# Patient Record
Sex: Female | Born: 1985 | Race: Black or African American | Hispanic: No | Marital: Married | Smoking: Current some day smoker
Health system: Southern US, Community
[De-identification: ages and names within clinical notes are randomized; demographics above are authoritative.]

## PROBLEM LIST (undated history)

## (undated) ENCOUNTER — Inpatient Hospital Stay (HOSPITAL_COMMUNITY): Payer: Self-pay

## (undated) DIAGNOSIS — R569 Unspecified convulsions: Secondary | ICD-10-CM

## (undated) DIAGNOSIS — Z87898 Personal history of other specified conditions: Secondary | ICD-10-CM

## (undated) DIAGNOSIS — C519 Malignant neoplasm of vulva, unspecified: Secondary | ICD-10-CM

## (undated) DIAGNOSIS — C801 Malignant (primary) neoplasm, unspecified: Secondary | ICD-10-CM

## (undated) DIAGNOSIS — Z803 Family history of malignant neoplasm of breast: Secondary | ICD-10-CM

## (undated) DIAGNOSIS — S0232XA Fracture of orbital floor, left side, initial encounter for closed fracture: Secondary | ICD-10-CM

## (undated) HISTORY — PX: WISDOM TOOTH EXTRACTION: SHX21

## (undated) HISTORY — PX: ABDOMINAL HYSTERECTOMY: SHX81

## (undated) HISTORY — DX: Malignant (primary) neoplasm, unspecified: C80.1

## (undated) HISTORY — DX: Family history of malignant neoplasm of breast: Z80.3

---

## 2008-09-27 ENCOUNTER — Emergency Department (HOSPITAL_COMMUNITY): Admission: EM | Admit: 2008-09-27 | Discharge: 2008-09-27 | Payer: Self-pay | Admitting: Emergency Medicine

## 2008-12-03 ENCOUNTER — Emergency Department (HOSPITAL_COMMUNITY): Admission: EM | Admit: 2008-12-03 | Discharge: 2008-12-03 | Payer: Self-pay | Admitting: Emergency Medicine

## 2010-05-23 LAB — URINALYSIS, ROUTINE W REFLEX MICROSCOPIC
Bilirubin Urine: NEGATIVE
Glucose, UA: NEGATIVE mg/dL
Specific Gravity, Urine: 1.01 (ref 1.005–1.030)

## 2010-05-23 LAB — WET PREP, GENITAL
Trich, Wet Prep: NONE SEEN
Yeast Wet Prep HPF POC: NONE SEEN

## 2010-05-23 LAB — URINE MICROSCOPIC-ADD ON

## 2010-05-26 LAB — BASIC METABOLIC PANEL
BUN: 19 mg/dL (ref 6–23)
CO2: 28 mEq/L (ref 19–32)
Chloride: 99 mEq/L (ref 96–112)
GFR calc non Af Amer: >60 mL/min (ref >60)
Glucose, Bld: 95 mg/dL (ref 70–99)
Potassium: 3.7 mEq/L (ref 3.5–5.1)
Sodium: 139 mEq/L (ref 135–145)

## 2010-07-25 ENCOUNTER — Other Ambulatory Visit: Payer: Self-pay | Admitting: Obstetrics

## 2010-07-25 DIAGNOSIS — Z975 Presence of (intrauterine) contraceptive device: Secondary | ICD-10-CM

## 2010-07-25 DIAGNOSIS — R102 Pelvic and perineal pain: Secondary | ICD-10-CM

## 2010-07-30 ENCOUNTER — Other Ambulatory Visit (HOSPITAL_COMMUNITY): Payer: Self-pay

## 2010-07-30 ENCOUNTER — Ambulatory Visit (HOSPITAL_COMMUNITY)
Admission: RE | Admit: 2010-07-30 | Discharge: 2010-07-30 | Disposition: A | Discharge status: Home / Self Care | Payer: Medicaid Other | Source: Ambulatory Visit | Attending: Obstetrics | Admitting: Obstetrics

## 2010-07-30 DIAGNOSIS — N949 Unspecified condition associated with female genital organs and menstrual cycle: Secondary | ICD-10-CM | POA: Insufficient documentation

## 2010-07-30 DIAGNOSIS — R102 Pelvic and perineal pain: Secondary | ICD-10-CM

## 2010-07-30 DIAGNOSIS — N854 Malposition of uterus: Secondary | ICD-10-CM | POA: Insufficient documentation

## 2010-07-30 DIAGNOSIS — Z975 Presence of (intrauterine) contraceptive device: Secondary | ICD-10-CM

## 2010-07-30 DIAGNOSIS — Z30431 Encounter for routine checking of intrauterine contraceptive device: Secondary | ICD-10-CM | POA: Insufficient documentation

## 2010-07-30 IMAGING — US US TRANSVAGINAL NON-OB
1 series · 13 of 25 positions shown · non-contrast
Comparison: None.

CLINICAL DATA: Pelvic pain.  Lost IUD string.

TRANSABDOMINAL AND TRANSVAGINAL ULTRASOUND OF PELVIS
TECHNIQUE: Both transabdominal and transvaginal ultrasound
examinations of the pelvis were performed.  Transabdominal
technique was performed for global imaging of the pelvis including
uterus, ovaries, adnexal regions, and pelvic cul-de-sac.
It was necessary to proceed with endovaginal exam following the
transabdominal exam to visualize the endometrium, IUD, and ovaries.
3-D volume imaging of the uterus was also performed to best
demonstrate IUD location.

[Series 1: us pelvis complete · 13 of 46 slices shown]
[im 1/46]
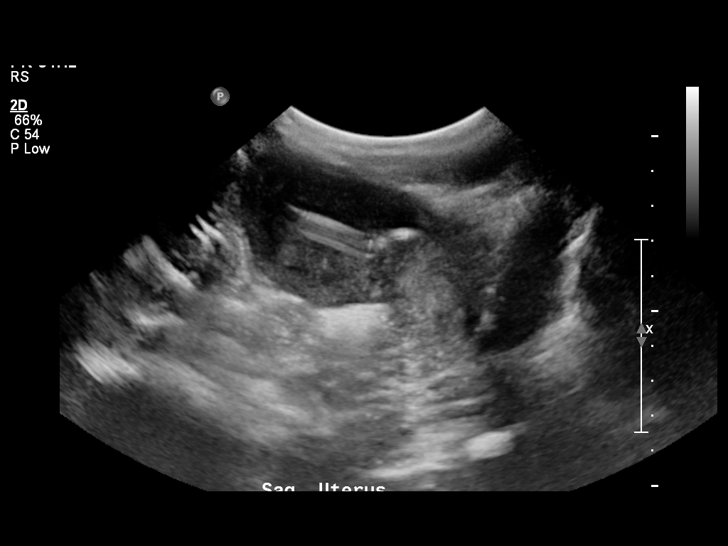
[im 4/46]
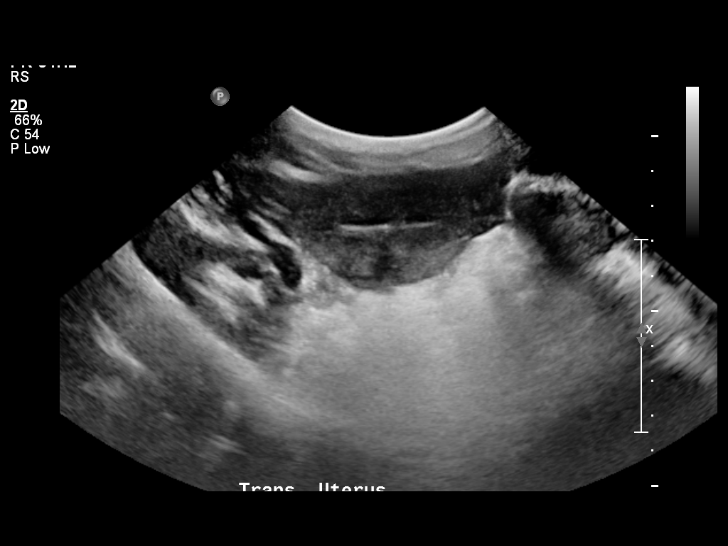
[im 8/46]
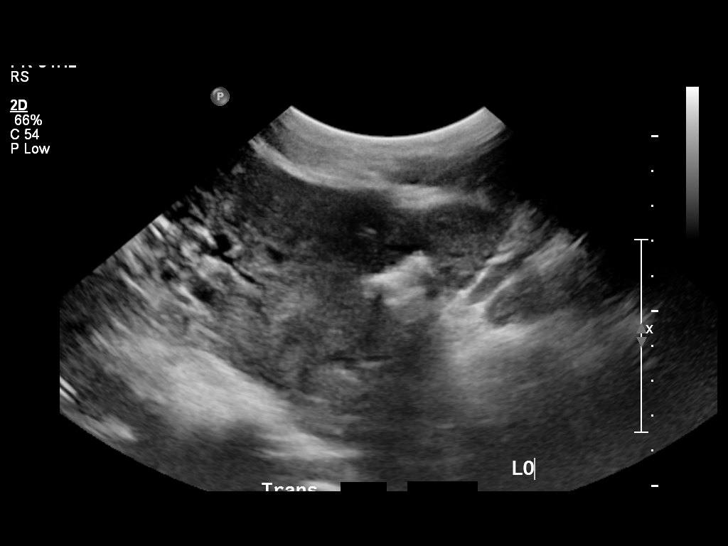
[im 12/46]
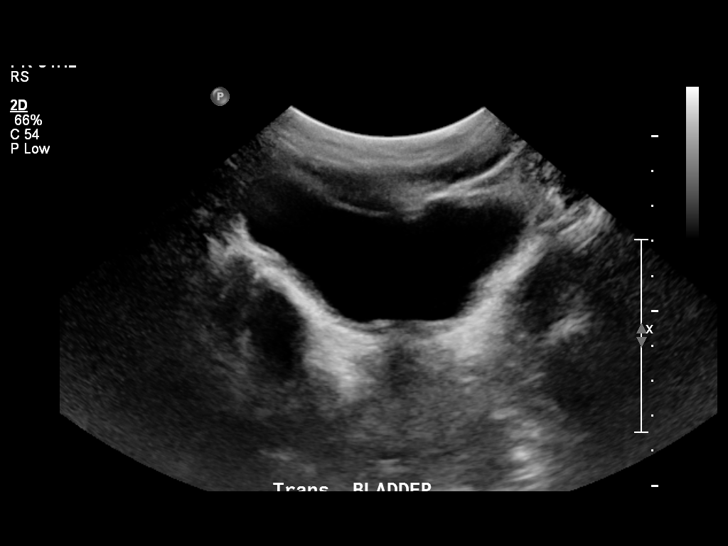
[im 16/46]
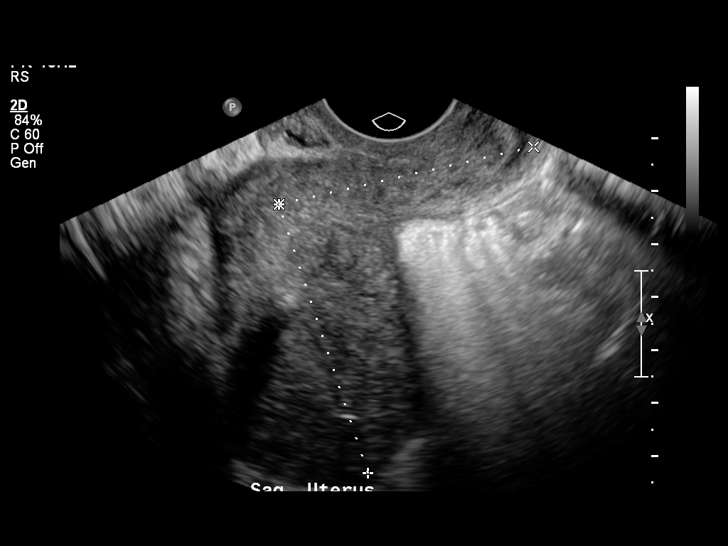
[im 19/46]
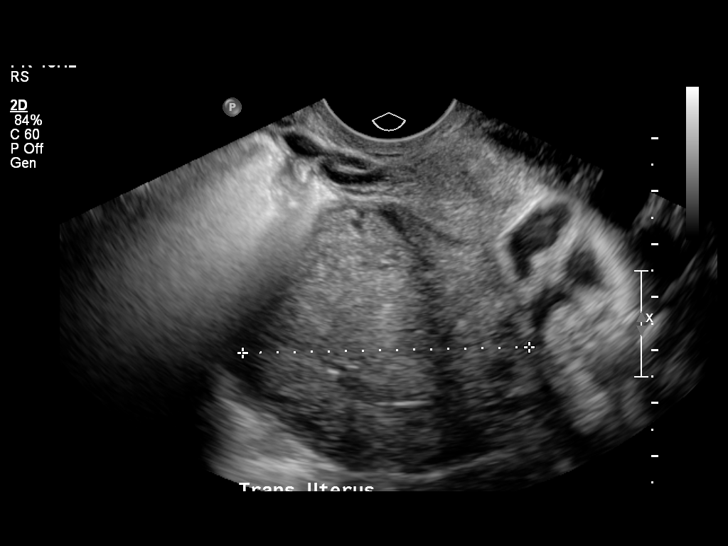
[im 23/46]
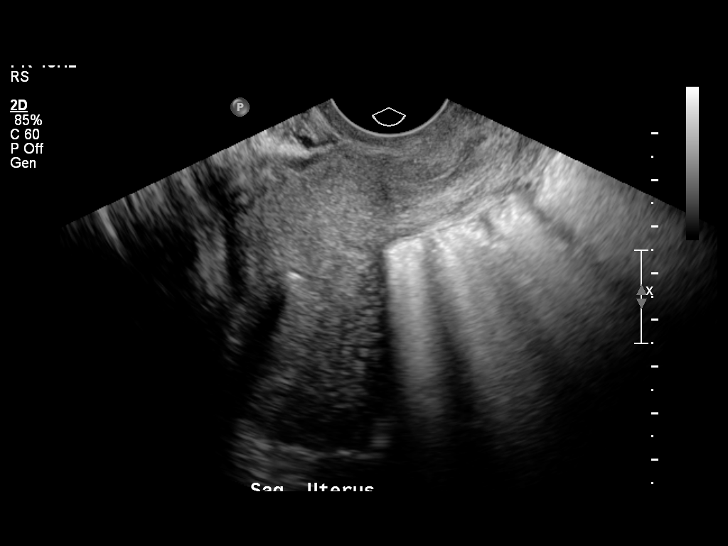
[im 27/46]
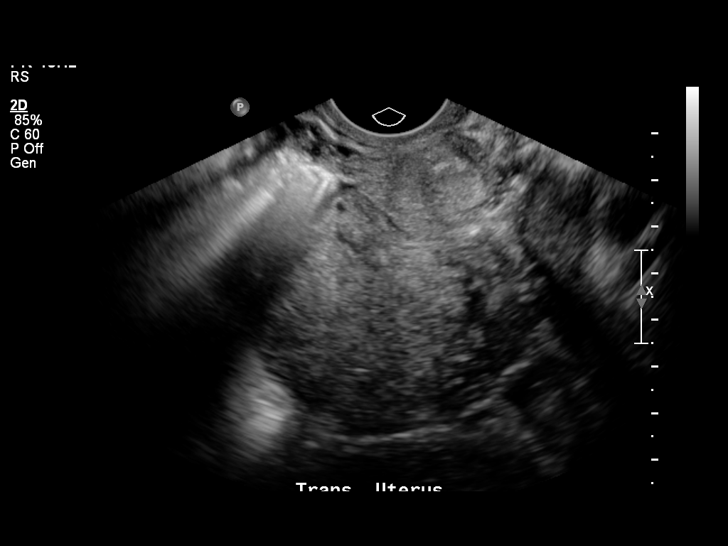
[im 31/46]
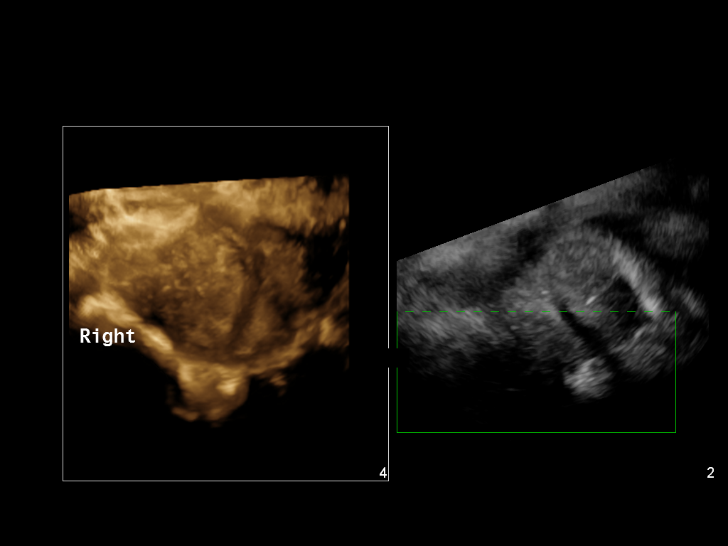
[im 34/46]
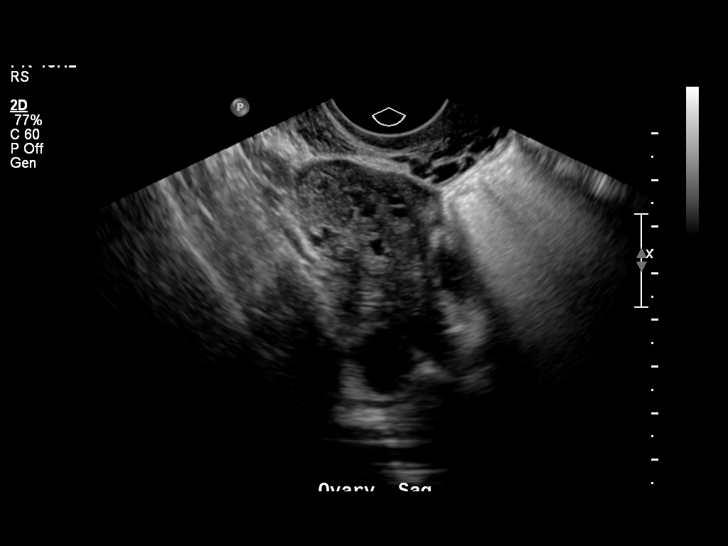
[im 38/46]
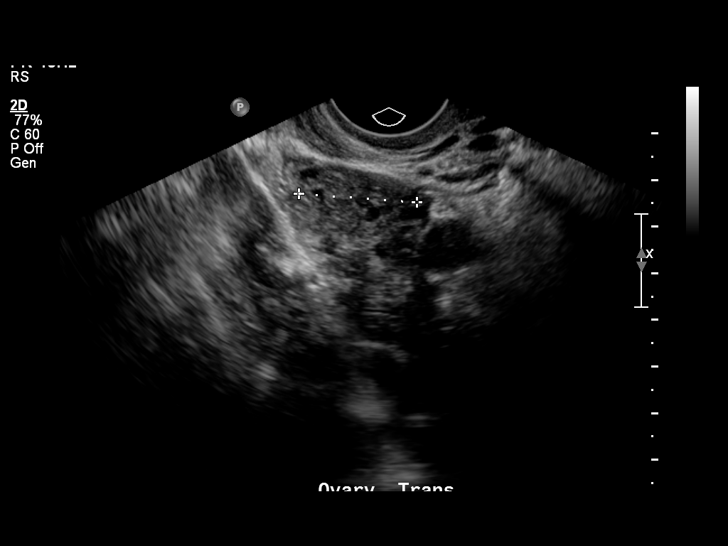
[im 42/46]
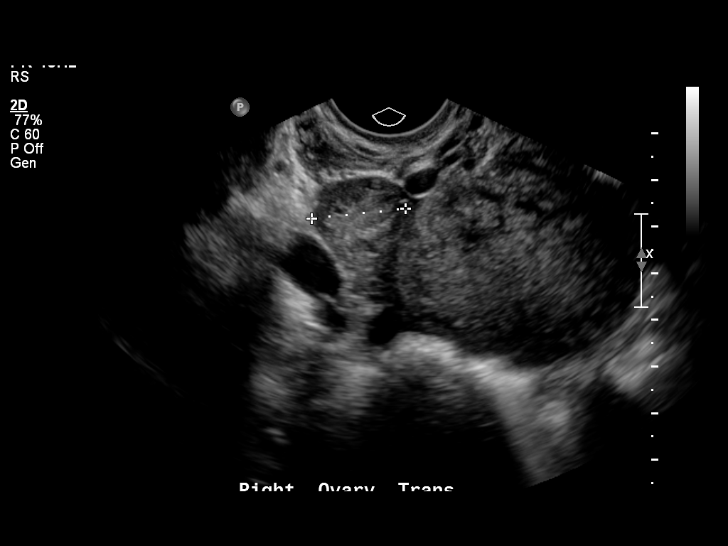
[im 46/46]
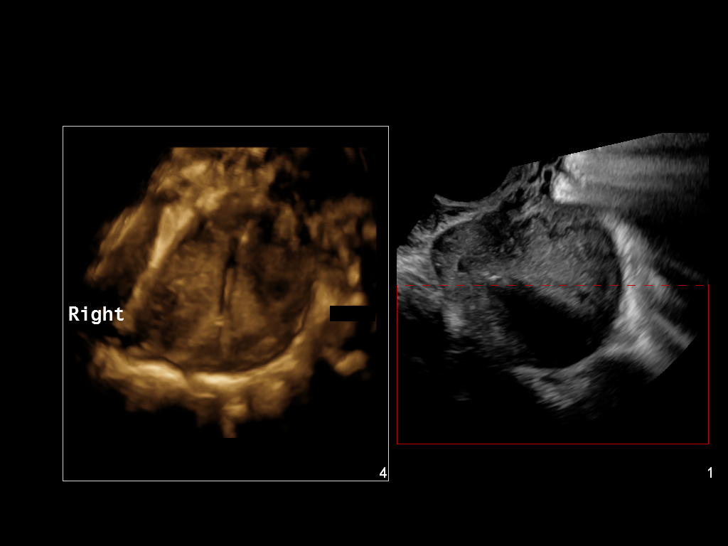

[13 of 25 positions shown; findings below may reference images not displayed]

FINDINGS: Uterus:  10.2 x 3.9 x 5.4 cm.  The uterus is retroflexed. No
fibroids are identified.

Endometrium:  IUD is seen in normal location in the endometrial
cavity on 3-D imaging.  Accurate endometrial thickness could not be
obtained due to uterine lie and IUD.

Right ovary:  2.9 x 1.9 x 2.0 cm.  Normal appearance/no adnexal
mass.

Left ovary:  3.1 x 1.9 x 2.5 cm.  Normal appearance/no adnexal
mass.

Other findings:  No free fluid.
IMPRESSION: 1.  Retroflexed uterus.  IUD in normal location in the endometrial
cavity.
2.  No evidence of pelvic mass or other significant abnormality.

## 2010-08-16 ENCOUNTER — Ambulatory Visit (HOSPITAL_COMMUNITY)
Admission: RE | Admit: 2010-08-16 | Discharge: 2010-08-16 | Disposition: A | Discharge status: Home / Self Care | Payer: Medicaid Other | Source: Ambulatory Visit | Attending: Obstetrics & Gynecology | Admitting: Obstetrics & Gynecology

## 2010-08-16 DIAGNOSIS — Z30432 Encounter for removal of intrauterine contraceptive device: Secondary | ICD-10-CM | POA: Insufficient documentation

## 2010-08-16 HISTORY — PX: HYSTEROSCOPY: SHX211

## 2010-08-16 HISTORY — PX: IUD REMOVAL: SHX5392

## 2010-08-16 LAB — CBC
HCT: 41.1 % (ref 36.0–46.0)
Hemoglobin: 14.6 g/dL (ref 12.0–15.0)
MCH: 32.3 pg (ref 26.0–34.0)
MCHC: 35.5 g/dL (ref 30.0–36.0)
MCV: 90.9 fL (ref 78.0–100.0)

## 2010-08-23 NOTE — Op Note (Signed)
  NAMEKIMBRA, Regina Villegas               ACCOUNT NO.:  [id number]  MEDICAL RECORD NO.:  [id number]  LOCATION:  [facility]                          FACILITY:  [facility]  PHYSICIAN:  Roseanna Rainbow, M.D.DATE OF BIRTH:  06-27-1985  DATE OF PROCEDURE:  08/16/2010 DATE OF DISCHARGE:                              OPERATIVE REPORT   PREOPERATIVE DIAGNOSIS:  Retained intrauterine device.  POSTOPERATIVE DIAGNOSIS:  Retained intrauterine device.  PROCEDURE:  Hysteroscopy, removal of IUD.  SURGEON:  Roseanna Rainbow, MD  ANESTHESIA:  Managed anesthesia care, paracervical block.  FINDINGS:  The IUD was present in the endometrial cavity.  PATHOLOGY:  None.  ESTIMATED BLOOD LOSS:  Minimal.  COMPLICATIONS:  None.  PROCEDURE:  The patient was taken to the operating room with an IV running.  She was placed in the semilithotomy position in Stanton stirrups and prepped and draped in the usual sterile fashion.  After time-out had been completed, a speculum was placed in the patient's vagina.  The anterior lip of the cervix was then infiltrated with 2 mL of 1% lidocaine plain.  The single-tooth tenaculum was then applied to this location.  A 4 mL of 1% lidocaine plain were injected at 4 and 7 o'clock to produce a paracervical block.  The cervix was then dilated with [facility] dilators.  The hysteroscope was then introduced into the uterine cavity. The above findings were noted.  A grasper was introduced into the operative port of the hysteroscope.  The grasper was then used to grasp the IUD.  The IUD was then removed along with a hysteroscope.  The IUD was removed intact.  There was minimal deficit for the distending medium used.  The single-tooth tenaculum was then removed with minimal bleeding noted from the cervix.  All the instrument counts were correct at the close of the procedure.  The patient was taken to the PACU awake and in stable condition.    Roseanna Rainbow, M.D.    [name]   D:  08/16/2010  T:  08/17/2010  Job:  [id number]  Electronically Signed by Antionette Char M.D. on 08/23/2010 03:45:57 PM

## 2010-08-30 ENCOUNTER — Ambulatory Visit: Admit: 2010-08-30 | Payer: Self-pay | Admitting: Obstetrics & Gynecology

## 2010-08-30 SURGERY — REMOVAL, INTRAUTERINE DEVICE
Anesthesia: Choice

## 2013-07-13 ENCOUNTER — Ambulatory Visit: Payer: Self-pay | Admitting: Obstetrics & Gynecology

## 2013-07-13 ENCOUNTER — Emergency Department (HOSPITAL_COMMUNITY)
Admission: EM | Admit: 2013-07-13 | Discharge: 2013-07-13 | Disposition: A | Discharge status: Home / Self Care | Payer: 59 | Attending: Emergency Medicine | Admitting: Emergency Medicine

## 2013-07-13 ENCOUNTER — Encounter (HOSPITAL_COMMUNITY): Payer: Self-pay | Admitting: Emergency Medicine

## 2013-07-13 DIAGNOSIS — Z113 Encounter for screening for infections with a predominantly sexual mode of transmission: Secondary | ICD-10-CM | POA: Insufficient documentation

## 2013-07-13 DIAGNOSIS — A499 Bacterial infection, unspecified: Secondary | ICD-10-CM | POA: Insufficient documentation

## 2013-07-13 DIAGNOSIS — Z3202 Encounter for pregnancy test, result negative: Secondary | ICD-10-CM | POA: Insufficient documentation

## 2013-07-13 DIAGNOSIS — F17200 Nicotine dependence, unspecified, uncomplicated: Secondary | ICD-10-CM | POA: Insufficient documentation

## 2013-07-13 DIAGNOSIS — B9689 Other specified bacterial agents as the cause of diseases classified elsewhere: Secondary | ICD-10-CM | POA: Insufficient documentation

## 2013-07-13 DIAGNOSIS — N760 Acute vaginitis: Secondary | ICD-10-CM | POA: Insufficient documentation

## 2013-07-13 LAB — URINE MICROSCOPIC-ADD ON

## 2013-07-13 LAB — URINALYSIS, ROUTINE W REFLEX MICROSCOPIC
Bilirubin Urine: NEGATIVE
GLUCOSE, UA: NEGATIVE mg/dL
Ketones, ur: NEGATIVE mg/dL
Nitrite: NEGATIVE
PH: 7 (ref 5.0–8.0)
PROTEIN: NEGATIVE mg/dL
Specific Gravity, Urine: 1.011 (ref 1.005–1.030)
Urobilinogen, UA: 0.2 mg/dL (ref 0.0–1.0)

## 2013-07-13 LAB — WET PREP, GENITAL
TRICH WET PREP: NONE SEEN
YEAST WET PREP: NONE SEEN

## 2013-07-13 LAB — PREGNANCY, URINE: PREG TEST UR: NEGATIVE

## 2013-07-13 LAB — RPR

## 2013-07-13 MED ORDER — CEFTRIAXONE SODIUM 250 MG IJ SOLR
250.0000 mg | Freq: Once | INTRAMUSCULAR | Status: AC
  Administered 2013-07-13: 250 mg via INTRAMUSCULAR
  Filled 2013-07-13: qty 250

## 2013-07-13 MED ORDER — AZITHROMYCIN 250 MG PO TABS
1000.0000 mg | ORAL_TABLET | Freq: Once | ORAL | Status: AC
  Administered 2013-07-13: 1000 mg via ORAL
  Filled 2013-07-13: qty 4

## 2013-07-13 MED ORDER — LIDOCAINE HCL (PF) 1 % IJ SOLN
INTRAMUSCULAR | Status: AC
  Administered 2013-07-13: 5 mL
  Filled 2013-07-13: qty 5

## 2013-07-13 MED ORDER — METRONIDAZOLE 500 MG PO TABS: 500.0000 mg | ORAL_TABLET | Freq: Two times a day (BID) | ORAL | Status: DC

## 2013-07-13 NOTE — ED Provider Notes (Signed)
CSN: [id number]     Arrival date & time 07/13/13  1518 History   First MD Initiated Contact with Patient 07/13/13 1614     Chief Complaint  Patient presents with  . SEXUALLY TRANSMITTED DISEASE   HPI  Regina Villegas is a 28 y.o. female with no PMH who presents to the ED for evaluation of an STD.  History was provided by the patient. Patient states her sexual partner has been complaining of symptoms (unknown) and she is concerned about STD's. She is currently asymptomatic. No genital sores, dyspareunia, vaginal discharge, dysuria, pelvic pain, abdominal pain, nausea, emesis, diarrhea or constipation. Currently on menstrual cycle. No hx of STD's in the past. Martin Majestic to her PCP office today for testing but they had an emergency and had to cancel her appointment. She denies any new partners. She also complains of abrupt fluctuating weight. She states her weight changes within 5 pounds during certain times of the month, mostly around her menstrual cycle. She denies any significant weight loss. She states she has a hx of irregular thyroid labs but is not on any medication for this. She denies any fevers, night sweats, fatigue, weakness, or other concerns. She states she has otherwise been well. She has an appointment for a physical in 1 week with her PCP.   History reviewed. No pertinent past medical history. Past Surgical History  Procedure Laterality Date  . Cesarean section     History reviewed. No pertinent family history. History  Substance Use Topics  . Smoking status: Current Some Day Smoker  . Smokeless tobacco: Not on file  . Alcohol Use: Yes   OB History   Grav Para Term Preterm Abortions TAB SAB Ect Mult Living                 Review of Systems  Constitutional: Negative for fever, chills, diaphoresis, activity change, appetite change, fatigue and unexpected weight change.  HENT: Negative for congestion, rhinorrhea and sore throat.   Respiratory: Negative for cough.   Cardiovascular:  Negative for chest pain.  Gastrointestinal: Negative for nausea, vomiting, abdominal pain and diarrhea.  Endocrine: Negative for cold intolerance and heat intolerance.  Genitourinary: Positive for vaginal bleeding. Negative for dysuria, urgency, frequency, hematuria, flank pain, decreased urine volume, vaginal discharge, difficulty urinating, genital sores, vaginal pain, menstrual problem, pelvic pain and dyspareunia.  Musculoskeletal: Negative for back pain, myalgias and neck pain.  Skin: Negative for color change and rash.  Neurological: Negative for dizziness, weakness, light-headedness, numbness and headaches.    Allergies  Review of patient's allergies indicates no known allergies.  Home Medications   Prior to Admission medications   Not on File   BP 135/76  Pulse 69  Temp(Src) 98 F (36.7 C) (Oral)  Resp 16  Ht 5\' 3"  (1.6 m)  Wt 114 lb 7 oz (51.909 kg)  BMI 20.28 kg/m2  SpO2 100%  LMP 07/06/2013  Filed Vitals:   07/13/13 1630 07/13/13 1645 07/13/13 1742 07/13/13 1815  BP: 105/62 122/77 126/81 117/77  Pulse: 61 69 66 69  Temp:      TempSrc:      Resp:    16  Height:      Weight:      SpO2: 100% 100% 100% 100%    Physical Exam  Nursing note and vitals reviewed. Constitutional: She is oriented to person, place, and time. She appears well-developed and well-nourished. No distress.  HENT:  Head: Normocephalic and atraumatic.  Right Ear: External ear normal.  Left Ear: External ear normal.  Nose: Nose normal.  Mouth/Throat: Oropharynx is clear and moist. No oropharyngeal exudate.  Eyes: Conjunctivae are normal. Right eye exhibits no discharge. Left eye exhibits no discharge.  Neck: Normal range of motion. Neck supple.  Cardiovascular: Normal rate, regular rhythm, normal heart sounds and intact distal pulses.  Exam reveals no gallop and no friction rub.   No murmur heard. Pulmonary/Chest: Effort normal and breath sounds normal. No respiratory distress. She has no  wheezes. She has no rales. She exhibits no tenderness.  Abdominal: Soft. Bowel sounds are normal. She exhibits no distension and no mass. There is no tenderness. There is no rebound and no guarding.  Genitourinary:  No genital sores. Minimal blood present in the vaginal vault. No vaginal discharge. No CMT or adnexal tenderness.   Musculoskeletal: Normal range of motion. She exhibits no edema and no tenderness.  Neurological: She is alert and oriented to person, place, and time.  Skin: Skin is warm and dry. She is not diaphoretic.    ED Course  Procedures (including critical care time) Labs Review Labs Reviewed - No data to display  Imaging Review No results found.   EKG Interpretation None      WET PREP, GENITAL      Result Value Ref Range   Yeast Wet Prep HPF POC NONE SEEN  NONE SEEN   Trich, Wet Prep NONE SEEN  NONE SEEN   Clue Cells Wet Prep HPF POC MODERATE (*) NONE SEEN   WBC, Wet Prep HPF POC RARE (*) NONE SEEN                                                              URINALYSIS, ROUTINE W REFLEX MICROSCOPIC      Result Value Ref Range   Color, Urine YELLOW  YELLOW   APPearance CLOUDY (*) CLEAR   Specific Gravity, Urine 1.011  1.005 - 1.030   pH 7.0  5.0 - 8.0   Glucose, UA NEGATIVE  NEGATIVE mg/dL   Hgb urine dipstick LARGE (*) NEGATIVE   Bilirubin Urine NEGATIVE  NEGATIVE   Ketones, ur NEGATIVE  NEGATIVE mg/dL   Protein, ur NEGATIVE  NEGATIVE mg/dL   Urobilinogen, UA 0.2  0.0 - 1.0 mg/dL   Nitrite NEGATIVE  NEGATIVE   Leukocytes, UA MODERATE (*) NEGATIVE  PREGNANCY, URINE      Result Value Ref Range   Preg Test, Ur NEGATIVE  NEGATIVE                        URINE MICROSCOPIC-ADD ON      Result Value Ref Range   Squamous Epithelial / LPF FEW (*) RARE   WBC, UA 3-6  <3 WBC/hpf   RBC / HPF 0-2  <3 RBC/hpf   Bacteria, UA MANY (*) RARE     MDM   Regina Villegas is a 28 y.o. female with no PMH who presents to the ED for evaluation of an  STD. STD panel pending. Patient asymptomatic. No abdominal pain. Patient found to have BV on wet mount. Will treat with flagyl. Pelvic exam remarkable. No concerns for PID. Patient treated with IM Rocephin and azithromycin in the ED. Possible UTI however patient has no dysuria. Will send for culture.  Will not treat at this time. Patient also complained of fluctuating weight (5 pounds), which may be due to her menstrual cycle. Denies significant weight loss, night sweats, fevers, or other concerning signs or symptoms. Patient has appointment with her PCP in one week to address this. Vital signs stable. Patient in agreement with discharge and plan. Return precautions, discharge instructions, and follow-up was discussed with the patient before discharge.     Discharge Medication List as of 07/13/2013  6:16 PM    START taking these medications   Details  metroNIDAZOLE (FLAGYL) 500 MG tablet Take 1 tablet (500 mg total) by mouth 2 (two) times daily., Starting 07/13/2013, Until Discontinued, Print        Final impressions: 1. Screening for STD (sexually transmitted disease)   2. Bacterial vaginosis      Harold Hedge Caedon Bond PA-C          [place], [place] 07/14/13 1310

## 2013-07-13 NOTE — ED Notes (Signed)
Pt reports that she had an appt with her PCP that was cancelled today. Reports that she is concerned that she may have an STD. Also reports that she has been losing weight.

## 2013-07-13 NOTE — Discharge Instructions (Signed)
Take flagyl for bacterial vaginosis - do not drink alcohol with this medication Avoid sexual intercourse until your results are back  Return to the emergency department if you develop any changing/worsening condition, fever, abdominal pain, repeated vomiting, or any other concerns (please read additional information regarding your condition below)  Bacterial Vaginosis Bacterial vaginosis is a vaginal infection that occurs when the normal balance of bacteria in the vagina is disrupted. It results from an overgrowth of certain bacteria. This is the most common vaginal infection in women of childbearing age. Treatment is important to prevent complications, especially in pregnant women, as it can cause a premature delivery. CAUSES  Bacterial vaginosis is caused by an increase in harmful bacteria that are normally present in smaller amounts in the vagina. Several different kinds of bacteria can cause bacterial vaginosis. However, the reason that the condition develops is not fully understood. RISK FACTORS Certain activities or behaviors can put you at an increased risk of developing bacterial vaginosis, including:  Having a new sex partner or multiple sex partners.  Douching.  Using an intrauterine device (IUD) for contraception. Women do not get bacterial vaginosis from toilet seats, bedding, swimming pools, or contact with objects around them. SIGNS AND SYMPTOMS  Some women with bacterial vaginosis have no signs or symptoms. Common symptoms include:  Grey vaginal discharge.  A fishlike odor with discharge, especially after sexual intercourse.  Itching or burning of the vagina and vulva.  Burning or pain with urination. DIAGNOSIS  Your health care provider will take a medical history and examine the vagina for signs of bacterial vaginosis. A sample of vaginal fluid may be taken. Your health care provider will look at this sample under a microscope to check for bacteria and abnormal cells. A  vaginal pH test may also be done.  TREATMENT  Bacterial vaginosis may be treated with antibiotic medicines. These may be given in the form of a pill or a vaginal cream. A second round of antibiotics may be prescribed if the condition comes back after treatment.  HOME CARE INSTRUCTIONS   Only take over-the-counter or prescription medicines as directed by your health care provider.  If antibiotic medicine was prescribed, take it as directed. Make sure you finish it even if you start to feel better.  Do not have sex until treatment is completed.  Tell all sexual partners that you have a vaginal infection. They should see their health care provider and be treated if they have problems, such as a mild rash or itching.  Practice safe sex by using condoms and only having one sex partner. SEEK MEDICAL CARE IF:   Your symptoms are not improving after 3 days of treatment.  You have increased discharge or pain.  You have a fever. MAKE SURE YOU:   Understand these instructions.  Will watch your condition.  Will get help right away if you are not doing well or get worse. FOR MORE INFORMATION  Centers for Disease Control and Prevention, Division of STD Prevention: SolutionApps.co.za American Sexual Health Association (ASHA): www.ashastd.org  Document Released: 02/03/2005 Document Revised: 11/24/2012 Document Reviewed: 09/15/2012 [facility] Patient Information 2014 [place], [place].  Sexually Transmitted Disease A sexually transmitted disease (STD) is a disease or infection that may be passed (transmitted) from person to person, usually during sexual activity. This may happen by way of saliva, semen, blood, vaginal mucus, or urine. Common STDs include:   Gonorrhea.   Chlamydia.   Syphilis.   HIV and AIDS.   Genital herpes.  Hepatitis B and C.   Trichomonas.   Human papillomavirus (HPV).   Pubic lice.   Scabies.  Mites.  Bacterial vaginosis. WHAT ARE CAUSES OF  STDs? An STD may be caused by bacteria, a virus, or parasites. STDs are often transmitted during sexual activity if one person is infected. However, they may also be transmitted through nonsexual means. STDs may be transmitted after:   Sexual intercourse with an infected person.   Sharing sex toys with an infected person.   Sharing needles with an infected person or using unclean piercing or tattoo needles.  Having intimate contact with the genitals, mouth, or rectal areas of an infected person.   Exposure to infected fluids during birth. WHAT ARE THE SIGNS AND SYMPTOMS OF STDs? Different STDs have different symptoms. Some people may not have any symptoms. If symptoms are present, they may include:   Painful or bloody urination.   Pain in the pelvis, abdomen, vagina, anus, throat, or eyes.   Skin rash, itching, irritation, growths, sores (lesions), ulcerations, or warts in the genital or anal area.  Abnormal vaginal discharge with or without bad odor.   Penile discharge in men.   Fever.   Pain or bleeding during sexual intercourse.   Swollen glands in the groin area.   Yellow skin and eyes (jaundice). This is seen with hepatitis.   Swollen testicles.  Infertility.  Sores and blisters in the mouth. HOW ARE STDs DIAGNOSED? To make a diagnosis, your health care provider may:   Take a medical history.   Perform a physical exam.   Take a sample of any discharge for examination.  Swab the throat, cervix, opening to the penis, rectum, or vagina for testing.  Test a sample of your first morning urine.   Perform blood tests.   Perform a Pap smear, if this applies.   Perform a colposcopy.   Perform a laparoscopy.  HOW ARE STDs TREATED? Treatment depends on the STD. Some STDs may be treated but not cured.   Chlamydia, gonorrhea, trichomonas, and syphilis can be cured with antibiotics.   Genital herpes, hepatitis, and HIV can be treated, but not  cured, with prescribed medicines. The medicines lessen symptoms.   Genital warts from HPV can be treated with medicine or by freezing, burning (electrocautery), or surgery. Warts may come back.   HPV cannot be cured with medicine or surgery. However, abnormal areas may be removed from the cervix, vagina, or vulva.   If your diagnosis is confirmed, your recent sexual partners need treatment. This is true even if they are symptom-free or have a negative culture or evaluation. They should not have sex until their health care providers say it is OK. HOW CAN I REDUCE MY RISK OF GETTING AN STD?  Use latex condoms, dental dams, and water-soluble lubricants during sexual activity. Do not use petroleum jelly or oils.  Get vaccinated for HPV and hepatitis. If you have not received these vaccines in the past, talk to your health care provider about whether one or both might be right for you.   Avoid risky sex practices that can break the skin.  WHAT SHOULD I DO IF I THINK I HAVE AN STD?  See your health care provider.   Inform all sexual partners. They should be tested and treated for any STDs.  Do not have sex until your health care provider says it is OK. WHEN SHOULD I GET HELP? Seek immediate medical care if:  You develop severe abdominal pain.  You are a man and notice swelling or pain in the testicles.  You are a woman and notice swelling or pain in your vagina. Document Released: 04/26/2002 Document Revised: 11/24/2012 Document Reviewed: 08/24/2012 [facility] Patient Information 2014 [place], [place].   Emergency Department Resource Guide 1) Find a Doctor and Pay Out of Pocket Although you won't have to find out who is covered by your insurance plan, it is a good idea to ask around and get recommendations. You will then need to call the office and see if the doctor you have chosen will accept you as a new patient and what types of options they offer for patients who are self-pay.  Some doctors offer discounts or will set up payment plans for their patients who do not have insurance, but you will need to ask so you aren't surprised when you get to your appointment.  2) Contact Your Local Health Department Not all health departments have doctors that can see patients for sick visits, but many do, so it is worth a call to see if yours does. If you don't know where your local health department is, you can check in your phone book. The CDC also has a tool to help you locate your state's health department, and many state websites also have listings of all of their local health departments.  3) Find a [facility] If your illness is not likely to be very severe or complicated, you may want to try a walk in clinic. These are popping up all over the country in pharmacies, drugstores, and shopping centers. They're usually staffed by nurse practitioners or physician assistants that have been trained to treat common illnesses and complaints. They're usually fairly quick and inexpensive. However, if you have serious medical issues or chronic medical problems, these are probably not your best option.  No Primary Care Doctor: - Call Health Connect at  [phone] - they can help you locate a primary care doctor that  accepts your insurance, provides certain services, etc. - Physician Referral Service- [phone]  Chronic Pain Problems: Organization         Address  Phone   Notes  [facility]  [phone] Patients need to be referred by their primary care doctor.   Medication Assistance: Organization         Address  Phone   Notes  [facility] Medication [facility] [address] [place], [place] [address] [phone] --Must be a resident of [facility] -- Must have NO insurance coverage whatsoever (no Medicaid/ Medicare, etc.) -- The pt. MUST have a primary care doctor that directs their care regularly and follows them in the  community   MedAssist  [phone]   [employer]  [phone]    Agencies that provide inexpensive medical care: Organization         Address  Phone   Notes  [place]  [phone]   [facility]    [phone]   [facility] [place], [place] [address] [phone]   [place] [address] [place] [phone]   Planned Parenthood    [phone]   [facility]    [phone]   [place] and [place] [address], [place] Phone:  [phone], Fax:  [phone] Hours of Operation:  9 am - 6 pm, M-F.  Also accepts Medicaid/Medicare  and self-pay.  [facility] for [place] [address], [address], [place] Phone: [phone], Fax: [phone]. Hours of Operation:  8:30 am - 5:30 pm, M-F.  Also accepts Medicaid and self-pay.  [facility] [place] [address], [place] Phone: [phone]   [place], [place], [place] [phone], Ext. [phone] Mondays & Thursdays: 7-9 AM.  First 15 patients are seen on a first come, first serve basis.    [place] Providers:  Organization         Address  Phone   Notes  [facility] [address], [place] [phone] Also accepts self-pay patients.  [facility] [address], [place]  [phone]   [employer], [address], [place] [phone]   [facility] [address], [place] [phone]   Lucianne Lei [address], [place]   [phone] Only accepts [place] Access [place] patients after they have their name applied to their card.   Self-Pay (no insurance) in [facility]:  Organization         Address  Phone   Notes  Sickle Cell Patients, [facility]  Internal Medicine [place] [phone]   [facility] [place] [phone]   [facility] [place]  [place], [place], [place] [phone]   [facility]/Dr. Osei-Bonsu  [address] [place] or [address], [place] [phone] Phone number for both [place] and [place] locations is the same.  Urgent Medical and [facility] [address] [place] [phone]   [facility] [address], [place] or [address] [phone]   [facility] [address] [place] [phone], phone; [phone], fax Sees patients 1st and 3rd Saturday of every month.  Must not qualify for public or private insurance (i.e. Medicaid, Medicare, [place] Health Choice, Veterans' Benefits)  Household income should be no more than 200% of the poverty level The clinic cannot treat you if you are pregnant or think you are pregnant  Sexually transmitted diseases are not treated at the clinic.    Dental Care: Organization         Address  Phone  Notes  [facility] Department of [facility] [place] [phone] Accepts children up to age 28 who are enrolled in [place] or [place]; pregnant women with a Medicaid card; and children who have applied for Medicaid or [place] Health Choice, but were declined, whose parents can pay a reduced fee at time of service.  [facility] Department of [facility]  [address], [place] [phone] Accepts children up to age 11 who are enrolled in [place] or [place]; pregnant women with a Medicaid card; and children who have applied for Medicaid or [place] Health Choice, but were declined, whose parents can pay a reduced fee at time of service.  [place] Adult Dental Access PROGRAM  [place] [phone] Patients are seen by appointment only. Walk-ins are not accepted. [place] will see patients 38 years of age and older. Monday - Tuesday (8am-5pm) Most Wednesdays (  8:30-5pm) $30 per visit, cash only  [facility] Adult Dental Access PROGRAM  [address], [facility] [phone] Patients are seen by appointment only. Walk-ins are not accepted. [place] will see patients 44 years of age and older. One Wednesday Evening (Monthly: Volunteer Based).  $30 per visit, cash only  [place]  [phone] for adults; Children under age 38, call Graduate Pediatric Dentistry at [phone]. Children aged 35-14, please call [phone] to request a pediatric application.  Dental services are provided in all areas of dental care including fillings, crowns and bridges, complete and partial dentures, implants, gum treatment, root canals, and extractions. Preventive care is also provided. Treatment is provided to both adults and children. Patients are selected via a lottery and there is often a waiting list.   [facility] [address] [place]  [phone] [email or web address]   [facility] [address] [place], [place] [phone], Ext. [phone] Second and Fourth Thursday of each month, opens at 6:30 AM; Clinic ends at 9 AM.  Patients are seen on a first-come first-served basis, and a limited number are seen during each clinic.   [facility]  [address] [place] Danker [place], [place] [phone]   Eligibility Requirements You must have lived in [place], [place], or [place] counties for at least the last three months.   You cannot be eligible for state or federal sponsored [employer], including [employer], [place], or [employer].   You generally cannot be eligible for healthcare insurance through your employer.    How to apply: Eligibility screenings are held every  Tuesday and Wednesday afternoon from 1:00 pm until 4:00 pm. You do not need an appointment for the interview!  [facility] [address] [place], [place]   [place]  [place] Department  [place]  [phone]    Behavioral Health Resources in the Community: Intensive Outpatient Programs Organization         Address  Phone  Notes  [place]. [address] [place], [place] [phone]   [facility]patient [address] [place], [place]   ADS: Alcohol & Drug Svcs [address] [place], [place]   [place] [address]  [place], [place] or [phone]   Substance Abuse Resources Organization         Address  Phone  Notes  Alcohol and Drug Services  [phone]   [place]  [phone]   [facility] [place]   Chinita Pester  [phone]   Residential & Outpatient Substance Abuse Program  [phone]   Psychological Services Organization         Address  Phone  Notes  [facility] [place]  [place]  [phone]   [place] [address], [place] or [phone]    Mobile Crisis Teams Organization         Address  Phone  Notes  [facility], Mobile Crisis Care Unit  [phone]   [facility]  [address] [place], [place]   [place] [address], [place] [phone]    Self-Help/Support Groups Organization         Address  Phone  Notes  Mental Health Assoc. of [place] - variety of support groups  [place] Call for more information  Narcotics Anonymous (NA), Caring [facility] [address], [employer] [place]  2 meetings at this location    Special educational needs teacher         Address  Phone  Notes  ASAP Residential Treatment [place],    [place]  [phone]   [facility]  [address], [place] [phone], [place], [place]   [place], [place] [phone] Admissions: 8am-3pm M-F  Incentives Substance [place] [address],    [place], [place] [phone]   [facility] [address] [place], [place], [place]   [facility] [address]  [place], [place]   [facility] Programs - Intensive Outpatient [address], [place], [place]   [facility] ([place].,  [place], [place] [phone] or [phone]   [facility] Services (RTS) [address] [place], [place] Accepts Medicaid  [place] [address],  [place] [phone] Substance Abuse/Addiction Treatment   [facility] Organization         Address  Phone  Notes  [facility]  [phone]   Domenic Schwab, PhD [address] [place], [place]   [phone] or [phone]   [facility] [place], [place] [phone]   [facility] [address], [place], [place] [phone] Insurance/Medicaid/sponsorship through [facility] and Families [address]                                    [place], [place] [phone] [place] [address], [place] [phone]    Dr. Adele Schilder  [phone]   [facility] [place] Dept. 1) [address], [place] 2) [place] 3)  [place] [address], [place] [phone]  [phone]   [place] [phone] or [phone] (After  Hours)

## 2013-07-14 LAB — HIV ANTIBODY (ROUTINE TESTING W REFLEX): HIV: NONREACTIVE

## 2013-07-14 LAB — GC/CHLAMYDIA PROBE AMP
CT PROBE, AMP APTIMA: NEGATIVE
GC Probe RNA: POSITIVE — AB

## 2013-07-15 LAB — URINE CULTURE: Colony Count: >=100000

## 2013-07-16 ENCOUNTER — Telehealth (HOSPITAL_BASED_OUTPATIENT_CLINIC_OR_DEPARTMENT_OTHER): Payer: Self-pay | Admitting: Emergency Medicine

## 2013-07-16 NOTE — Telephone Encounter (Signed)
+  Gonorrhea. Patient treated with Rocephin and Zithromax. DHHS faxed.

## 2013-07-16 NOTE — ED Provider Notes (Signed)
Medical screening examination/treatment/procedure(s) were performed by non-physician practitioner and as supervising physician I was immediately available for consultation/collaboration.   EKG Interpretation None        [place], DO 07/16/13 [id number]

## 2013-07-16 NOTE — Telephone Encounter (Signed)
Post ED Visit - Positive Culture Follow-up: Successful Patient Follow-Up  Culture assessed and recommendations reviewed by: []  Wes Manson, Pharm.D., BCPS []  Heide Guile, Pharm.D., BCPS [x]  Alycia Rossetti, Pharm.D., BCPS []  [place], [place].D., BCPS, AAHIVP []  Legrand Como, Pharm.D., BCPS, AAHIVP  Positive urine culture  [x]  Patient discharged without antimicrobial prescription and treatment is now indicated []  Organism is resistant to prescribed ED discharge antimicrobial []  Patient with positive blood cultures  Changes discussed with ED provider: Vernie Murders PA-C New antibiotic prescription: Bactrim DS 1 tab q 12 hrs x 5 days    Regina Villegas 07/16/2013, 6:18 PM

## 2013-07-17 NOTE — Telephone Encounter (Signed)
Rx for Bactrim DS 1 tab q 12 hrs x 5 days, prescribed by Vernie Murders PA-C, called [facility] to [facility] on [place] [phone]) by Georgina Peer PFM.

## 2013-07-17 NOTE — Progress Notes (Signed)
ED Antimicrobial Stewardship Positive Culture Follow Up   Regina Villegas is an 28 y.o. female who [facility] to [facility]2013 with a chief complaint of eval for STDs Chief Complaint  Patient presents with  . SEXUALLY TRANSMITTED DISEASE    Recent Results (from the past 720 hour(s))  GC/CHLAMYDIA PROBE AMP     Status: Abnormal   Collection Time    07/13/13  4:28 PM      Result Value Ref Range Status   CT Probe RNA NEGATIVE  NEGATIVE Final   GC Probe RNA POSITIVE (*) NEGATIVE Final   Comment: (NOTE)     A Positive CT or NG Nucleic Acid Amplification Test (NAAT) result     should be considered presumptive evidence of infection.  The result     should be evaluated along with physical examination and other     diagnostic findings.                                                                                               **Normal Reference Range: Negative**          Assay performed using the Gen-Probe APTIMA COMBO2 (R) Assay.     Acceptable specimen types for this assay include APTIMA Swabs (Unisex,     endocervical, urethral, or vaginal), first void urine, and ThinPrep     liquid based cytology samples.     Performed at [place], [facility]     Status: Abnormal   Collection Time    07/13/13  4:28 PM      Result Value Ref Range Status   Yeast Wet Prep HPF POC NONE SEEN  NONE SEEN Final   Trich, Wet Prep NONE SEEN  NONE SEEN Final   Clue Cells Wet Prep HPF POC MODERATE (*) NONE SEEN Final   WBC, Wet Prep HPF POC RARE (*) NONE SEEN Final  URINE CULTURE     Status: None   Collection Time    07/13/13  4:36 PM      Result Value Ref Range Status   Specimen Description URINE, RANDOM   Final   Special Requests NONE   Final   Culture  Setup Time     Final   Value: 07/13/2013 18:40     Performed at [place]     Final   Value: >=100,000 COLONIES/ML     Performed at [facility]   Culture     Final   Value: ESCHERICHIA  COLI     Performed at [facility]   Report Status 07/15/2013 FINAL   Final   Organism ID, Bacteria ESCHERICHIA COLI   Final    [x]  Patient discharged originally without antimicrobial agent and treatment is now indicated  New antibiotic prescription: Bactrim DS 1 tab bid x 5 days  ED Provider: Vernie Murders PA-C  Rolla Flatten 07/17/2013, 3:31 PM Infectious Diseases Pharmacist Phone# [phone]

## 2013-07-20 ENCOUNTER — Encounter: Payer: Self-pay | Admitting: Obstetrics & Gynecology

## 2013-07-20 ENCOUNTER — Ambulatory Visit (INDEPENDENT_AMBULATORY_CARE_PROVIDER_SITE_OTHER): Payer: 59 | Admitting: Obstetrics & Gynecology

## 2013-07-20 VITALS — BP 97/67 | HR 68 | Temp 98.1°F | Ht 63.0 in | Wt 114.0 lb

## 2013-07-20 DIAGNOSIS — Z01419 Encounter for gynecological examination (general) (routine) without abnormal findings: Secondary | ICD-10-CM

## 2013-07-20 DIAGNOSIS — F329 Major depressive disorder, single episode, unspecified: Secondary | ICD-10-CM

## 2013-07-20 DIAGNOSIS — N390 Urinary tract infection, site not specified: Secondary | ICD-10-CM

## 2013-07-20 DIAGNOSIS — IMO0001 Reserved for inherently not codable concepts without codable children: Secondary | ICD-10-CM

## 2013-07-20 DIAGNOSIS — F32A Depression, unspecified: Secondary | ICD-10-CM

## 2013-07-20 LAB — TSH: TSH: 0.445 u[IU]/mL (ref 0.350–4.500)

## 2013-07-20 MED ORDER — MEDROXYPROGESTERONE ACETATE 150 MG/ML IM SUSP: 150.0000 mg | INTRAMUSCULAR | Status: DC

## 2013-07-20 NOTE — Progress Notes (Signed)
Subjective:     Regina Villegas is a 28 y.o. female here for a routine exam.  Current complaints: UTI symptoms.    Personal health questionnaire:  Is patient Ashkenazi Jewish, have a family history of breast and/or ovarian cancer: no Is there a family history of uterine cancer diagnosed at age < 71, gastrointestinal cancer, urinary tract cancer, family member who is a Field seismologist syndrome-associated carrier: no Is the patient overweight and hypertensive, family history of diabetes, personal history of gestational diabetes or PCOS: no Is patient over 65, have PCOS,  family history of premature CHD under age 44, diabetes, smoke, have hypertension or peripheral artery disease:  no   Gynecologic History Patient's last menstrual period was 07/12/2013. Contraception: Depo-Provera injections         Obstetric History OB History  Gravida Para Term Preterm AB SAB TAB Ectopic Multiple Living  3 3 3       3     # Outcome Date GA Lbr Len/2nd Weight Sex Delivery Anes PTL Lv  3 TRM 01/28/06 [redacted]w[redacted]d  2.722 kg (6 lb) F LTCS EPI  Y  2 TRM 11/03/03 [redacted]w[redacted]d  3.345 kg (7 lb 6 oz) M LTCS EPI  Y  1 TRM 05/04/01 [redacted]w[redacted]d  4.082 kg (9 lb) M LTCS EPI  Y      History reviewed. No pertinent past medical history.  Past Surgical History  Procedure Laterality Date  . Cesarean section      Current outpatient prescriptions:ENSURE (ENSURE), Take 237 mLs by mouth daily., Disp: , Rfl: ;  metroNIDAZOLE (FLAGYL) 500 MG tablet, Take 1 tablet (500 mg total) by mouth 2 (two) times daily., Disp: 14 tablet, Rfl: 0;  sulfamethoxazole-trimethoprim (BACTRIM DS) 800-160 MG per tablet, Take 1 tablet by mouth 2 (two) times daily., Disp: , Rfl:  No Known Allergies  History  Substance Use Topics  . Smoking status: Current Some Day Smoker  . Smokeless tobacco: Not on file  . Alcohol Use: Yes    History reviewed. No pertinent family history.    Review of Systems  Constitutional: negative for fatigue and weight loss Respiratory:  negative for cough and wheezing Cardiovascular: negative for chest pain, fatigue and palpitations Gastrointestinal: negative for abdominal pain and change in bowel habits Musculoskeletal:negative for myalgias Neurological: negative for gait problems and tremors Behavioral/Psych: negative for abusive relationship, depression Endocrine: negative for temperature intolerance   Genitourinary:negative for abnormal menstrual periods, genital lesions, hot flashes, sexual problems and vaginal discharge; positive for dysuria, urgency, frequency Integument/breast: negative for breast lump, breast tenderness, nipple discharge and skin lesion(s)    Objective:       BP 97/67  Pulse 68  Temp(Src) 98.1 F (36.7 C)  Ht 5\' 3"  (1.6 m)  Wt 51.71 kg (114 lb)  BMI 20.20 kg/m2  LMP 07/12/2013 General:   alert  Skin:   no rash or abnormalities  Lungs:   clear to auscultation bilaterally  Heart:   regular rate and rhythm, S1, S2 normal, no murmur, click, rub or gallop  Breasts:   normal without suspicious masses, skin or nipple changes or axillary nodes  Abdomen:  normal findings: no organomegaly, soft, non-tender and no hernia  Pelvis:  External genitalia: normal general appearance Urinary system: urethral meatus normal and bladder without fullness, nontender Vaginal: normal without tenderness, induration or masses Cervix: normal appearance Adnexa: normal bimanual exam Uterus: anteverted and non-tender, normal size   Lab Review  Labs reviewed no Radiologic studies reviewed no Depression screening tool   Assessment:  Healthy female exam.   UTI Positive depression screening tool Plan:  Referral-->Behavioral Health Counseled re: health lifestyle practices Meds ordered this encounter  Medications  . sulfamethoxazole-trimethoprim (BACTRIM DS) 800-160 MG per tablet    Sig: Take 1 tablet by mouth 2 (two) times daily.   Follow up as needed.

## 2013-07-21 LAB — PAP IG W/ RFLX HPV ASCU

## 2013-07-28 NOTE — Patient Instructions (Signed)
Urinary Tract Infection Urinary tract infections (UTIs) can develop anywhere along your urinary tract. Your urinary tract is your body's drainage system for removing wastes and extra water. Your urinary tract includes two kidneys, two ureters, a bladder, and a urethra. Your kidneys are a pair of bean-shaped organs. Each kidney is about the size of your fist. They are located below your ribs, one on each side of your spine. CAUSES Infections are caused by microbes, which are microscopic organisms, including fungi, viruses, and bacteria. These organisms are so small that they can only be seen through a microscope. Bacteria are the microbes that most commonly cause UTIs. SYMPTOMS  Symptoms of UTIs may vary by age and gender of the patient and by the location of the infection. Symptoms in young women typically include a frequent and intense urge to urinate and a painful, burning feeling in the bladder or urethra during urination. Older women and men are more likely to be tired, shaky, and weak and have muscle aches and abdominal pain. A fever may mean the infection is in your kidneys. Other symptoms of a kidney infection include pain in your back or sides below the ribs, nausea, and vomiting. DIAGNOSIS To diagnose a UTI, your caregiver will ask you about your symptoms. Your caregiver also will ask to provide a urine sample. The urine sample will be tested for bacteria and white blood cells. White blood cells are made by your body to help fight infection. TREATMENT  Typically, UTIs can be treated with medication. Because most UTIs are caused by a bacterial infection, they usually can be treated with the use of antibiotics. The choice of antibiotic and length of treatment depend on your symptoms and the type of bacteria causing your infection. HOME CARE INSTRUCTIONS  If you were prescribed antibiotics, take them exactly as your caregiver instructs you. Finish the medication even if you feel better after you  have only taken some of the medication.  Drink enough water and fluids to keep your urine clear or pale yellow.  Avoid caffeine, tea, and carbonated beverages. They tend to irritate your bladder.  Empty your bladder often. Avoid holding urine for long periods of time.  Empty your bladder before and after sexual intercourse.  After a bowel movement, women should cleanse from front to back. Use each tissue only once. SEEK MEDICAL CARE IF:   You have back pain.  You develop a fever.  Your symptoms do not begin to resolve within 3 days. SEEK IMMEDIATE MEDICAL CARE IF:   You have severe back pain or lower abdominal pain.  You develop chills.  You have nausea or vomiting.  You have continued burning or discomfort with urination. MAKE SURE YOU:   Understand these instructions.  Will watch your condition.  Will get help right away if you are not doing well or get worse. Document Released: 11/13/2004 Document Revised: 08/05/2011 Document Reviewed: 03/14/2011 [facility] Patient Information 2014 [place]. Health Maintenance, Female A healthy lifestyle and preventative care can promote health and wellness.  Maintain regular health, dental, and eye exams.  Eat a healthy diet. Foods like vegetables, fruits, whole grains, low-fat dairy products, and lean protein foods contain the nutrients you need without too many calories. Decrease your intake of foods high in solid fats, added sugars, and salt. Get information about a proper diet from your caregiver, if necessary.  Regular physical exercise is one of the most important things you can do for your health. Most adults should get at least  150 minutes of moderate-intensity exercise (any activity that increases your heart rate and causes you to sweat) each week. In addition, most adults need muscle-strengthening exercises on 2 or more days a week.   Maintain a healthy weight. The body mass index (BMI) is a screening tool to identify  possible weight problems. It provides an estimate of body fat based on height and weight. Your caregiver can help determine your BMI, and can help you achieve or maintain a healthy weight. For adults 20 years and older:  A BMI below 18.5 is considered underweight.  A BMI of 18.5 to 24.9 is normal.  A BMI of 25 to 29.9 is considered overweight.  A BMI of 30 and above is considered obese.  Maintain normal blood lipids and cholesterol by exercising and minimizing your intake of saturated fat. Eat a balanced diet with plenty of fruits and vegetables. Blood tests for lipids and cholesterol should begin at age 56 and be repeated every 5 years. If your lipid or cholesterol levels are high, you are over 50, or you are a high risk for heart disease, you may need your cholesterol levels checked more frequently.Ongoing high lipid and cholesterol levels should be treated with medicines if diet and exercise are not effective.  If you smoke, find out from your caregiver how to quit. If you do not use tobacco, do not start.  Lung cancer screening is recommended for adults aged 78 80 years who are at high risk for developing lung cancer because of a history of smoking. Yearly low-dose computed tomography (CT) is recommended for people who have at least a 30-pack-year history of smoking and are a current smoker or have quit within the past 15 years. A pack year of smoking is smoking an average of 1 pack of cigarettes a day for 1 year (for example: 1 pack a day for 30 years or 2 packs a day for 15 years). Yearly screening should continue until the smoker has stopped smoking for at least 15 years. Yearly screening should also be stopped for people who develop a health problem that would prevent them from having lung cancer treatment.  If you are pregnant, do not drink alcohol. If you are breastfeeding, be very cautious about drinking alcohol. If you are not pregnant and choose to drink alcohol, do not exceed 1 drink  per day. One drink is considered to be 12 ounces (355 mL) of beer, 5 ounces (148 mL) of wine, or 1.5 ounces (44 mL) of liquor.  Avoid use of street drugs. Do not share needles with anyone. Ask for help if you need support or instructions about stopping the use of drugs.  High blood pressure causes heart disease and increases the risk of stroke. Blood pressure should be checked at least every 1 to 2 years. Ongoing high blood pressure should be treated with medicines, if weight loss and exercise are not effective.  If you are 40 to 30 years old, ask your caregiver if you should take aspirin to prevent strokes.  Diabetes screening involves taking a blood sample to check your fasting blood sugar level. This should be done once every 3 years, after age 48, if you are within normal weight and without risk factors for diabetes. Testing should be considered at a younger age or be carried out more frequently if you are overweight and have at least 1 risk factor for diabetes.  Breast cancer screening is essential preventative care for women. You should practice "breast self-awareness." This  means understanding the normal appearance and feel of your breasts and may include breast self-examination. Any changes detected, no matter how small, should be reported to a caregiver. Women in their 74s and 30s should have a clinical breast exam (CBE) by a caregiver as part of a regular health exam every 1 to 3 years. After age 40, women should have a CBE every year. Starting at age 38, women should consider having a mammogram (breast X-ray) every year. Women who have a family history of breast cancer should talk to their caregiver about genetic screening. Women at a high risk of breast cancer should talk to their caregiver about having an MRI and a mammogram every year.  Breast cancer gene (BRCA)-related cancer risk assessment is recommended for women who have family members with BRCA-related cancers. BRCA-related cancers  include breast, ovarian, tubal, and peritoneal cancers. Having family members with these cancers may be associated with an increased risk for harmful changes (mutations) in the breast cancer genes BRCA1 and BRCA2. Results of the assessment will determine the need for genetic counseling and BRCA1 and BRCA2 testing.  The Pap test is a screening test for cervical cancer. Women should have a Pap test starting at age 25. Between ages 77 and 20, Pap tests should be repeated every 2 years. Beginning at age 65, you should have a Pap test every 3 years as long as the past 3 Pap tests have been normal. If you had a hysterectomy for a problem that was not cancer or a condition that could lead to cancer, then you no longer need Pap tests. If you are between ages 33 and 41, and you have had normal Pap tests going back 10 years, you no longer need Pap tests. If you have had past treatment for cervical cancer or a condition that could lead to cancer, you need Pap tests and screening for cancer for at least 20 years after your treatment. If Pap tests have been discontinued, risk factors (such as a new sexual partner) need to be reassessed to determine if screening should be resumed. Some women have medical problems that increase the chance of getting cervical cancer. In these cases, your caregiver may recommend more frequent screening and Pap tests.  The human papillomavirus (HPV) test is an additional test that may be used for cervical cancer screening. The HPV test looks for the virus that can cause the cell changes on the cervix. The cells collected during the Pap test can be tested for HPV. The HPV test could be used to screen women aged 21 years and older, and should be used in women of any age who have unclear Pap test results. After the age of 50, women should have HPV testing at the same frequency as a Pap test.  Colorectal cancer can be detected and often prevented. Most routine colorectal cancer screening begins at  the age of 5 and continues through age 56. However, your caregiver may recommend screening at an earlier age if you have risk factors for colon cancer. On a yearly basis, your caregiver may provide home test kits to check for hidden blood in the stool. Use of a small camera at the end of a tube, to directly examine the colon (sigmoidoscopy or colonoscopy), can detect the earliest forms of colorectal cancer. Talk to your caregiver about this at age 63, when routine screening begins. Direct examination of the colon should be repeated every 5 to 10 years through age 61, unless early forms of pre-cancerous  polyps or small growths are found.  Hepatitis C blood testing is recommended for all people born from [birth year] through 1967 and any individual with known risks for hepatitis C.  Practice safe sex. Use condoms and avoid high-risk sexual practices to reduce the spread of sexually transmitted infections (STIs). Sexually active women aged 57 and younger should be checked for Chlamydia, which is a common sexually transmitted infection. Older women with new or multiple partners should also be tested for Chlamydia. Testing for other STIs is recommended if you are sexually active and at increased risk.  Osteoporosis is a disease in which the bones lose minerals and strength with aging. This can result in serious bone fractures. The risk of osteoporosis can be identified using a bone density scan. Women ages 33 and over and women at risk for fractures or osteoporosis should discuss screening with their caregivers. Ask your caregiver whether you should be taking a calcium supplement or vitamin D to reduce the rate of osteoporosis.  Menopause can be associated with physical symptoms and risks. Hormone replacement therapy is available to decrease symptoms and risks. You should talk to your caregiver about whether hormone replacement therapy is right for you.  Use sunscreen. Apply sunscreen liberally and repeatedly  throughout the day. You should seek shade when your shadow is shorter than you. Protect yourself by wearing long sleeves, pants, a wide-brimmed hat, and sunglasses year round, whenever you are outdoors.  Notify your caregiver of new moles or changes in moles, especially if there is a change in shape or color. Also notify your caregiver if a mole is larger than the size of a pencil eraser.  Stay current with your immunizations. Document Released: 08/19/2010 Document Revised: 05/31/2012 Document Reviewed: 08/19/2010 [facility] Patient Information 2014 [place]. Depression, Adult Depression refers to feeling sad, low, down in the dumps, blue, gloomy, or empty. In general, there are two kinds of depression: 1. Depression that we all experience from time to time because of upsetting life experiences, including the loss of a job or the ending of a relationship (normal sadness or normal grief). This kind of depression is considered normal, is short lived, and resolves within a few days to 2 weeks. (Depression experienced after the loss of a loved one is called bereavement. Bereavement often lasts longer than 2 weeks but normally gets better with time.) 2. Clinical depression, which lasts longer than normal sadness or normal grief or interferes with your ability to function at home, at work, and in school. It also interferes with your personal relationships. It affects almost every aspect of your life. Clinical depression is an illness. Symptoms of depression also can be caused by conditions other than normal sadness and grief or clinical depression. Examples of these conditions are listed as follows:  Physical illness Some physical illnesses, including underactive thyroid gland (hypothyroidism), severe anemia, specific types of cancer, diabetes, uncontrolled seizures, heart and lung problems, strokes, and chronic pain are commonly associated with symptoms of depression.  Side effects of some  prescription medicine In some people, certain types of prescription medicine can cause symptoms of depression.  Substance abuse Abuse of alcohol and illicit drugs can cause symptoms of depression. SYMPTOMS Symptoms of normal sadness and normal grief include the following:  Feeling sad or crying for short periods of time.  Not caring about anything (apathy).  Difficulty sleeping or sleeping too much.  No longer able to enjoy the things you used to enjoy.  Desire to be by oneself all the  time (social isolation).  Lack of energy or motivation.  Difficulty concentrating or remembering.  Change in appetite or weight.  Restlessness or agitation. Symptoms of clinical depression include the same symptoms of normal sadness or normal grief and also the following symptoms:  Feeling sad or crying all the time.  Feelings of guilt or worthlessness.  Feelings of hopelessness or helplessness.  Thoughts of suicide or the desire to harm yourself (suicidal ideation).  Loss of touch with reality (psychotic symptoms). Seeing or hearing things that are not real (hallucinations) or having false beliefs about your life or the people around you (delusions and paranoia). DIAGNOSIS  The diagnosis of clinical depression usually is based on the severity and duration of the symptoms. Your caregiver also will ask you questions about your medical history and substance use to find out if physical illness, use of prescription medicine, or substance abuse is causing your depression. Your caregiver also may order blood tests. TREATMENT  Typically, normal sadness and normal grief do not require treatment. However, sometimes antidepressant medicine is prescribed for bereavement to ease the depressive symptoms until they resolve. The treatment for clinical depression depends on the severity of your symptoms but typically includes antidepressant medicine, counseling with a mental health professional, or a combination  of both. Your caregiver will help to determine what treatment is best for you. Depression caused by physical illness usually goes away with appropriate medical treatment of the illness. If prescription medicine is causing depression, talk with your caregiver about stopping the medicine, decreasing the dose, or substituting another medicine. Depression caused by abuse of alcohol or illicit drugs abuse goes away with abstinence from these substances. Some adults need professional help in order to stop drinking or using drugs. SEEK IMMEDIATE CARE IF:  You have thoughts about hurting yourself or others.  You lose touch with reality (have psychotic symptoms).  You are taking medicine for depression and have a serious side effect. FOR MORE INFORMATION [employer] on Mental Illness: www.nami.[employer]: https://carter.com/ Document Released: 02/01/2000 Document Revised: 08/05/2011 Document Reviewed: 05/05/2011 [facility] Patient Information 2014 [place].

## 2013-07-30 ENCOUNTER — Encounter (HOSPITAL_COMMUNITY): Payer: Self-pay | Admitting: Emergency Medicine

## 2013-07-30 ENCOUNTER — Emergency Department (HOSPITAL_COMMUNITY)
Admission: EM | Admit: 2013-07-30 | Discharge: 2013-07-30 | Disposition: A | Discharge status: Home / Self Care | Payer: 59 | Attending: Emergency Medicine | Admitting: Emergency Medicine

## 2013-07-30 DIAGNOSIS — J029 Acute pharyngitis, unspecified: Secondary | ICD-10-CM | POA: Insufficient documentation

## 2013-07-30 DIAGNOSIS — F17200 Nicotine dependence, unspecified, uncomplicated: Secondary | ICD-10-CM | POA: Insufficient documentation

## 2013-07-30 DIAGNOSIS — Z792 Long term (current) use of antibiotics: Secondary | ICD-10-CM | POA: Insufficient documentation

## 2013-07-30 LAB — RAPID STREP SCREEN (MED CTR MEBANE ONLY): Streptococcus, Group A Screen (Direct): NEGATIVE

## 2013-07-30 MED ORDER — IBUPROFEN 800 MG PO TABS: 800.0000 mg | ORAL_TABLET | Freq: Three times a day (TID) | ORAL | Status: DC

## 2013-07-30 NOTE — ED Notes (Signed)
Patient refused wheelchair. 

## 2013-07-30 NOTE — Discharge Instructions (Signed)
Call for a follow up appointment with a Family or Primary Care Provider.  Return if Symptoms worsen.   Take medication as prescribed.  Gargle salt water 3-4 times a day.   Emergency Department Resource Guide 1) Find a Doctor and Pay Out of Pocket Although you won't have to find out who is covered by your insurance plan, it is a good idea to ask around and get recommendations. You will then need to call the office and see if the doctor you have chosen will accept you as a new patient and what types of options they offer for patients who are self-pay. Some doctors offer discounts or will set up payment plans for their patients who do not have insurance, but you will need to ask so you aren't surprised when you get to your appointment.  2) Contact Your Local Health Department Not all health departments have doctors that can see patients for sick visits, but many do, so it is worth a call to see if yours does. If you don't know where your local health department is, you can check in your phone book. The CDC also has a tool to help you locate your state's health department, and many state websites also have listings of all of their local health departments.  3) Find a [facility] If your illness is not likely to be very severe or complicated, you may want to try a walk in clinic. These are popping up all over the country in pharmacies, drugstores, and shopping centers. They're usually staffed by nurse practitioners or physician assistants that have been trained to treat common illnesses and complaints. They're usually fairly quick and inexpensive. However, if you have serious medical issues or chronic medical problems, these are probably not your best option.  No Primary Care Doctor: - Call Health Connect at  [phone] - they can help you locate a primary care doctor that  accepts your insurance, provides certain services, etc. - Physician Referral Service- [phone]  Chronic Pain  Problems: Organization         Address  Phone   Notes  [facility]  [phone] Patients need to be referred by their primary care doctor.   Medication Assistance: Organization         Address  Phone   Notes  [facility] Medication [facility] [address] [place], [place] [address] [phone] --Must be a resident of [facility] -- Must have NO insurance coverage whatsoever (no Medicaid/ Medicare, etc.) -- The pt. MUST have a primary care doctor that directs their care regularly and follows them in the community   MedAssist  [phone]   [employer]  [phone]    Agencies that provide inexpensive medical care: Organization         Address  Phone   Notes  [facility]  [phone]   [facility]    [phone]   [facility] [place], [place] [address] [phone]   [place] [address], [place] [phone]   Planned Parenthood    [phone]   [facility]    [phone]   [place] and [place] [address],  Phone:  [phone], Fax:  [phone] Hours of Operation:  9 am - 6 pm, M-F.  Also accepts Medicaid/Medicare and self-pay.  [facility] for Children  [place] [employer], [address], [place] Phone: [phone], Fax: [phone]. Hours of Operation:  8:30 am - 5:30 pm, M-F.  Also accepts Medicaid and self-pay.  [facility] [place] [address], [place] Phone: [phone]   [place], [place], [place] [phone], Ext. [phone] Mondays & Thursdays: 7-9 AM.  First 15 patients are seen on a first come, first serve basis.    [place] Providers:  Organization         Address  Phone   Notes  [facility] [address], [place] [phone] Also  accepts self-pay patients.  [facility] [address], [place]  [phone]   [place], [address], [place] [phone]   [facility] [address], [place] [phone]   Lucianne Lei [address], [place]   [phone] Only accepts [place] Access [place] patients after they have their name applied to their card.   Self-Pay (no insurance) in [facility]:  Organization         Address  Phone   Notes  Sickle Cell Patients, [facility] [place] [phone]   [facility] [place] [phone]   [facility] [place]  [place], [place], [place] [phone]   [facility]/Dr. Osei-Bonsu  [address] [place] or [address], [place] [phone] Phone number for both [place] and [place] locations is the same.  Urgent Medical and [facility] [address], [place] [phone]   [facility] [address] [place] or [address] [phone]   [facility] [address] [place] [phone], phone; [phone], fax Sees patients 1st and 3rd Saturday of every month.  Must not qualify for public or private insurance (i.e. Medicaid, Medicare, [place] Health Choice, Veterans' Benefits)  Household income should be no more than 200% of the poverty level The clinic cannot treat you if you are pregnant or think you are pregnant  Sexually transmitted diseases are not treated at the clinic.    Dental Care: Organization         Address  Phone  Notes  [facility] Department of [facility] [place] [phone] Accepts children up to age 37 who are enrolled in [place] or [place]; pregnant  women with a Medicaid card; and children who have applied for Medicaid or [place] Health Choice, but were declined, whose parents can pay a reduced fee at time of service.  [facility] Department of [facility]  [address], [place] [phone] Accepts children up to age 58 who are enrolled in [place] or [place]; pregnant women with a Medicaid card; and children who have applied for Medicaid or [place] Health Choice, but were declined, whose parents can pay a reduced fee at time of service.  [facility] PRO[facility]  [place] [phone] Patients are seen by appointment only. Walk-ins are not accepted. [place] will see patients 22 years of age and older. Monday - Tuesday (8am-5pm) Most Wednesdays (8:30-5pm) $30 per visit, cash only  [place] Adult  Dental Access PROGRAM  [address], [facility] [phone] Patients are seen by appointment only. Walk-ins are not accepted. [place] will see patients 60 years of age and older. One Wednesday Evening (Monthly: Volunteer Based).  $30 per visit, cash only  [place]  [phone] for adults; Children under age 4, call Graduate Pediatric Dentistry at [phone]. Children aged 49-14, please call [phone] to request a pediatric application.  Dental services are provided in all areas of dental care including fillings, crowns and bridges, complete and partial dentures, implants, gum treatment, root canals, and extractions. Preventive care is also provided. Treatment is provided to both adults and children. Patients are selected via a lottery and there is often a waiting list.   [facility] [address] [place]  [phone] [email or web address]   [facility] [address] [place], [place] [phone], Ext. [phone] Second and Fourth Thursday of each month, opens at 6:30 AM; Clinic ends at 9 AM.  Patients are  seen on a first-come first-served basis, and a limited number are seen during each clinic.   [facility]  [address] Hillard Danker [place], [place] [phone]   Eligibility Requirements You must have lived in [place], [place], or [place] counties for at least the last three months.   You cannot be eligible for state or federal sponsored [employer], including [employer], [place], or [employer].   You generally cannot be eligible for healthcare insurance through your employer.    How to apply: Eligibility screenings are held every Tuesday and Wednesday afternoon from 1:00 pm until 4:00 pm. You do not need an appointment for the interview!  [facility] [address] [place], [place]   [place]  [place] Department  [place]  [phone]    Behavioral Health Resources in the Community: Intensive Outpatient Programs Organization         Address  Phone  Notes  [place]. [address], [place], [place] [phone]   [facility] Outpatient [address], [place], [place]   ADS: Alcohol & Drug Svcs [address], [place], [place]   [place] [address],  [place], [place] or [phone]   Substance Abuse Resources Organization         Address  Phone  Notes  Alcohol and Drug Services  [phone]   [place]  [phone]   [facility] [place]   Chinita Pester  [phone]   Residential & Outpatient Substance Abuse Program  [phone]   Psychological Services Organization         Address  Phone  Notes  [facility]  [place]  [phone]   [place] [address] [place] [phone] or [phone]    Mobile Crisis  Teams Organization         Address  Phone  Notes  [facility], Mobile Crisis Care Unit  [phone]   [facility]ive Psychotherapeutic Services  [address] [place], [place]   [place] Levels [address] [place] [phone]    Self-Help/Support Groups Organization         Address  Phone             Notes  Mental  Health Assoc. of [place] - variety of support groups  [place] Call for more information  Narcotics Anonymous (NA), [facility] [address], [employer] [place]  2 meetings at this location   Special educational needs teacher         Address  Phone  Notes  ASAP Residential Treatment [place],    [place]  [phone]   [facility]  [address] [place] [address], [place], [place]   [place], [place] [phone] Admissions: 8am-3pm M-F  Incentives Substance [place] [address],    [place], [place] [phone]   [facility] [address] [place], [place], [place]   [facility] [address]  [place], [place]   [facility] Programs - Intensive Outpatient [address], [place], [place]   [facility] ([place] [address].,  [place], [place] [phone] or [phone]   [facility]idential Treatment Services (RTS) [address] [place], [place] Accepts Medicaid  [facility] [place] [address],  [place] [phone] Substance Abuse/Addiction Treatment   [facility] Organization         Address  Phone  Notes  [facility]  [phone]   Domenic Schwab, PhD [address] [place]lis [place], [place]   [phone] or [phone]   [place], [place] [phone]   [facility] [address] [place], [place] [phone]  Insurance/Medicaid/sponsorship through [facility] and Families [address], [address] [place]                                    [place], [place] [phone] [place] [address], [place] [phone]    Dr. Adele Schilder  [phone]   [facility] Dept. 1) [address], [place] 2) [place] 3)  [place] [address], [place] [phone]  [phone]   [place] [phone] or [phone] (After Hours)

## 2013-07-30 NOTE — ED Notes (Addendum)
Pt reports sore throat only since 12 today when she eats or swallows. Pt is a x 4. VSS/ mask in place . Also pt reports being treated for UTI

## 2013-07-30 NOTE — ED Provider Notes (Signed)
CSN: [id number]     Arrival date & time 07/30/13  1531 History  This chart was scribed for non-physician practitioner Harvie Heck working with Wandra Arthurs, MD by Mercy Moore, ED Scribe. This patient was seen in room TR10C/TR10C and the patient's care was started at 4:13 PM.   Chief Complaint  Patient presents with  . Sore Throat      HPI Comments: Regina Villegas is a 28 y.o. female who presents to the Emergency Department complaining of sore throat, ongoing since lunch time today (about 4 hours). Patient reports pain with swallowing both food and liquids. States that she hasn't eaten or drank much today due to the pain. Patient denies fever, but reports malaise last night. Patient has not taken any pain medication. Patient denies recent sick contacts.  The history is provided by the patient. No language interpreter was used.      History reviewed. No pertinent past medical history. Past Surgical History  Procedure Laterality Date  . Cesarean section     No family history on file. History  Substance Use Topics  . Smoking status: Current Some Day Smoker  . Smokeless tobacco: Not on file  . Alcohol Use: Yes   OB History   Grav Para Term Preterm Abortions TAB SAB Ect Mult Living   3 3 3       3      Review of Systems  Constitutional: Negative for fever.  HENT: Positive for sore throat. Negative for congestion and drooling.   Respiratory: Negative for cough.       Allergies  Review of patient's allergies indicates no known allergies.  Home Medications   Prior to Admission medications   Medication Sig Start Date End Date Taking? Authorizing Provider  ENSURE (ENSURE) Take 237 mLs by mouth daily.    Historical Provider, MD  medroxyPROGESTERone (DEPO-PROVERA) 150 MG/ML injection Inject 1 mL (150 mg total) into the muscle every 3 (three) months. 07/20/13   Lahoma Crocker, MD  metroNIDAZOLE (FLAGYL) 500 MG tablet Take 1 tablet (500 mg total) by mouth 2 (two) times daily.  07/13/13   Lucila [place], PA-C  sulfamethoxazole-trimethoprim (BACTRIM DS) 800-160 MG per tablet Take 1 tablet by mouth 2 (two) times daily.    Historical Provider, MD   Triage Vitals: BP 154/83  Pulse 118  Temp(Src) 99 F (37.2 C) (Oral)  Resp 17  Wt 114 lb (51.71 kg)  SpO2 98%  LMP 07/12/2013 Physical Exam  Nursing note and vitals reviewed. Constitutional: She is oriented to person, place, and time. She appears well-developed and well-nourished.  Non-toxic appearance. She does not have a sickly appearance. She does not appear ill. No distress.  HENT:  Head: Normocephalic and atraumatic.  Right Ear: External ear normal. Tympanic membrane is not bulging. No middle ear effusion.  Left Ear: Tympanic membrane and external ear normal. Tympanic membrane is not bulging.  No middle ear effusion.  Nose: Mucosal edema and rhinorrhea present. Right sinus exhibits no maxillary sinus tenderness and no frontal sinus tenderness. Left sinus exhibits no maxillary sinus tenderness and no frontal sinus tenderness.  Mouth/Throat: Uvula is midline and mucous membranes are normal. No oral lesions. No trismus in the jaw. No uvula swelling or dental caries. Posterior oropharyngeal erythema present. No oropharyngeal exudate, posterior oropharyngeal edema or tonsillar abscesses.  No oral lesions seen  Eyes: EOM are normal. Pupils are equal, round, and reactive to light. Right eye exhibits no discharge. Left eye exhibits no discharge.  Neck: Normal range  of motion. Neck supple.  Cardiovascular: Normal rate and regular rhythm.   No murmur heard. Not tachy cardic on exam  Pulmonary/Chest: Effort normal and breath sounds normal. No respiratory distress. She has no wheezes. She has no rales.  Abdominal: Soft.  Musculoskeletal: Normal range of motion.  Lymphadenopathy:       Head (right side): No submental, no submandibular and no tonsillar adenopathy present.       Head (left side): No submental, no submandibular  and no tonsillar adenopathy present.    She has no cervical adenopathy.  Neurological: She is alert and oriented to person, place, and time.  Skin: Skin is warm and dry. No rash noted. She is not diaphoretic.  Psychiatric: She has a normal mood and affect. Her behavior is normal. Thought content normal.    ED Course  Procedures (including critical care time)  COORDINATION OF CARE: 4:13 PM- Discussed treatment plan with patient at bedside and patient agreed to plan.     Labs Review Labs Reviewed  RAPID STREP SCREEN  CULTURE, GROUP A STREP    Imaging Review No results found.   EKG Interpretation None      MDM   Final diagnoses:  Pharyngitis   Patients symptoms are consistent with URI, rapid strep negative, likely viral etiology. Discussed that antibiotics are not indicated for viral infections. Pt will be discharged with symptomatic treatment.  Verbalizes understanding and is agreeable with plan. Pt is hemodynamically stable & in NAD prior to dc. Resources given.  Meds given in ED:  Medications - No data to display  New Prescriptions   No medications on file       Lorrine Kin, PA-C 07/30/13 1702

## 2013-07-30 NOTE — ED Provider Notes (Signed)
Medical screening examination/treatment/procedure(s) were performed by non-physician practitioner and as supervising physician I was immediately available for consultation/collaboration.   EKG Interpretation None        Wandra Arthurs, MD 07/30/13 [phone]

## 2013-08-01 LAB — CULTURE, GROUP A STREP

## 2013-08-15 ENCOUNTER — Ambulatory Visit: Payer: 59 | Admitting: Obstetrics & Gynecology

## 2013-08-22 ENCOUNTER — Ambulatory Visit: Payer: 59 | Admitting: Obstetrics & Gynecology

## 2013-08-25 ENCOUNTER — Ambulatory Visit: Payer: 59 | Admitting: Obstetrics & Gynecology

## 2013-09-12 ENCOUNTER — Encounter: Payer: Self-pay | Admitting: Obstetrics & Gynecology

## 2013-09-12 ENCOUNTER — Ambulatory Visit (INDEPENDENT_AMBULATORY_CARE_PROVIDER_SITE_OTHER): Payer: 59 | Admitting: Obstetrics & Gynecology

## 2013-09-12 ENCOUNTER — Other Ambulatory Visit: Payer: Self-pay

## 2013-09-12 VITALS — BP 114/78 | HR 95 | Temp 97.1°F | Ht 63.0 in | Wt 110.0 lb

## 2013-09-12 DIAGNOSIS — N9089 Other specified noninflammatory disorders of vulva and perineum: Secondary | ICD-10-CM

## 2013-09-12 DIAGNOSIS — Z3202 Encounter for pregnancy test, result negative: Secondary | ICD-10-CM

## 2013-09-12 LAB — POCT URINE PREGNANCY: PREG TEST UR: NEGATIVE

## 2013-09-12 MED ORDER — TRAMADOL HCL 50 MG PO TABS: 50.0000 mg | ORAL_TABLET | Freq: Four times a day (QID) | ORAL | Status: DC | PRN

## 2013-09-12 NOTE — Progress Notes (Signed)
Patient ID: Regina Villegas, female   DOB: Oct 02, 1985, 28 y.o.   MRN: [id number]  Chief Complaint  Patient presents with  . Vulvar Biopsy    HPI Regina Villegas is a 28 y.o. female.  She presents for biopsies of of lesions on the vulva.  HPI  Location:  labia majora bilaterally  Physical Exam  Genitourinary:       History reviewed. No pertinent past medical history.  Past Surgical History  Procedure Laterality Date  . Cesarean section      History reviewed. No pertinent family history.  Social History History  Substance Use Topics  . Smoking status: Current Some Day Smoker  . Smokeless tobacco: Not on file  . Alcohol Use: 2.0 oz/week    4 drink(s) per week    No Known Allergies  Current Outpatient Prescriptions  Medication Sig Dispense Refill  . ENSURE (ENSURE) Take 237 mLs by mouth daily.      Mar[place] Kitchen ibuprofen (ADVIL,MOTRIN) 800 MG tablet Take 1 tablet (800 mg total) by mouth 3 (three) times daily.  21 tablet  0  . medroxyPROGESTERone (DEPO-PROVERA) 150 MG/ML injection Inject 1 mL (150 mg total) into the muscle every 3 (three) months.  1 mL  3  . traMADol (ULTRAM) 50 MG tablet Take 1 tablet (50 mg total) by mouth every 6 (six) hours as needed.  30 tablet  0   No current facility-administered medications for this visit.    Review of Systems Review of Systems  Blood pressure 114/78, pulse 95, temperature 97.1 F (36.2 C), height 5\' 3"  (1.6 m), weight 49.896 kg (110 lb), last menstrual period 09/12/2013.  Physical Exam   Data Reviewed labs  Assessment    Prepping with Betadine Local anesthesia with 1% Buffered Lidocaine The verrucous lesions on the right excised on the base.  The left-sided hyperpigmented was circumscribed and excised.  An interrupted 4-0 Vicryl suture was placed on the left side. Silver Nitrate applied:  Yes Well tolerated  Specimen appropriately identified and sent to pathology    Plan    Follow-up:  2 weeks      JACKSON-MOORE,Mea Ozga  A 09/12/2013, 1:18 PM

## 2013-09-12 NOTE — Patient Instructions (Signed)
Vulva Biopsy Care After Refer to this sheet in the next few weeks. These instructions provide you with information on caring for yourself after your procedure. Your caregiver may also give you more specific instructions. Your treatment has been planned according to current medical practices, but problems sometimes occur. Call your caregiver if you have any problems or questions after your procedure.  HOME CARE INSTRUCTIONS  Only take over-the-counter or prescription medicines for pain or discomfort as directed by your caregiver.  Do not rub the biopsy area after urinating. Gently pat the area dry or use a bottle filled with warm water (peri-bottle) to clean the area. Gently wipe from front to back.  Clean the area using water and mild soap. Gently pat the area dry.  Check your biopsy site with a handheld mirror daily to be sure it is healing properly.  Do not have intercourse for 3-4 days or as directed by your caregiver.  Avoid exercise, such as running or biking, for 2-3 days or as directed by your caregiver.  You may shower after the procedure. Avoid bathing and swimming until the sutures dissolve and healing is complete.  Wear loose cotton underwear. Do not wear tight pants.  Keep all follow-up appointments with your caregiver.  Contact your caregiver to obtain your test results. SEEK IMMEDIATE MEDICAL CARE IF:  Your pain increases and medicine does not help it.  Your vaginal area becomes swollen or red.  You have fluid or an abnormally bad smell coming from your vagina.  You have a fever. MAKE SURE YOU:  Understand these instructions.  Will watch your condition.  Will get help right away if you are not doing well or get worse. Document Released: 01/21/2012 Document Reviewed: 01/21/2012 ExitCare Patient Information 2015 [employer]. This information is not intended to replace advice given to you by your health care provider. Make sure you discuss any questions you  have with your health care provider.  

## 2013-09-15 DIAGNOSIS — C519 Malignant neoplasm of vulva, unspecified: Secondary | ICD-10-CM

## 2013-09-15 HISTORY — DX: Malignant neoplasm of vulva, unspecified: C51.9

## 2013-09-22 ENCOUNTER — Ambulatory Visit (INDEPENDENT_AMBULATORY_CARE_PROVIDER_SITE_OTHER): Payer: 59 | Admitting: Obstetrics & Gynecology

## 2013-09-22 ENCOUNTER — Encounter: Payer: Self-pay | Admitting: Obstetrics & Gynecology

## 2013-09-22 VITALS — BP 131/92 | HR 77 | Temp 97.3°F | Ht 63.0 in | Wt 114.0 lb

## 2013-09-22 DIAGNOSIS — N901 Moderate vulvar dysplasia: Secondary | ICD-10-CM

## 2013-09-22 DIAGNOSIS — A630 Anogenital (venereal) warts: Secondary | ICD-10-CM

## 2013-09-24 DIAGNOSIS — A630 Anogenital (venereal) warts: Secondary | ICD-10-CM | POA: Insufficient documentation

## 2013-09-24 DIAGNOSIS — N901 Moderate vulvar dysplasia: Secondary | ICD-10-CM | POA: Insufficient documentation

## 2013-09-24 NOTE — Patient Instructions (Signed)
Genital Warts Genital warts are a sexually transmitted infection. They may appear as small bumps on the tissues of the genital area. CAUSES  Genital warts are caused by a virus called human papillomavirus (HPV). HPV is the most common sexually transmitted disease (STD) and infection of the sex organs. This infection is spread by having unprotected sex with an infected person. It can be spread by vaginal, anal, and oral sex. Many people do not know they are infected. They may be infected for years without problems. However, even if they do not have problems, they can unknowingly pass the infection to their sexual partners. SYMPTOMS   Itching and irritation in the genital area.  Warts that bleed.  Painful sexual intercourse. DIAGNOSIS  Warts are usually recognized with the naked eye on the vagina, vulva, perineum, anus, and rectum. Certain tests can also diagnose genital warts, such as:  A Pap test.  A tissue sample (biopsy) exam.  Colposcopy. A magnifying tool is used to examine the vagina and cervix. The HPV cells will change color when certain solutions are used. TREATMENT  Warts can be removed by:  Applying certain chemicals, such as cantharidin or podophyllin.  Liquid nitrogen freezing (cryotherapy).  Immunotherapy with Candida or Trichophyton injections.  Laser treatment.  Burning with an electrified probe (electrocautery).  Interferon injections.  Surgery. PREVENTION  HPV vaccination can help prevent HPV infections that cause genital warts and that cause cancer of the cervix. It is recommended that the vaccination be given to people between the ages 9 to 26 years old. The vaccine might not work as well or might not work at all if you already have HPV. It should not be given to pregnant women. HOME CARE INSTRUCTIONS   It is important to follow your caregiver's instructions. The warts will not go away without treatment. Repeat treatments are often needed to get rid of warts.  Even after it appears that the warts are gone, the normal tissue underneath often remains infected.  Do not try to treat genital warts with medicine used to treat hand warts. This type of medicine is strong and can burn the skin in the genital area, causing more damage.  Tell your past and current sexual partner(s) that you have genital warts. They may be infected also and need treatment.  Avoid sexual contact while being treated.  Do not touch or scratch the warts. The infection may spread to other parts of your body.  Women with genital warts should have a cervical cancer check (Pap test) at least once a year. This type of cancer is slow-growing and can be cured if found early. Chances of developing cervical cancer are increased with HPV.  Inform your obstetrician about your warts in the event of pregnancy. This virus can be passed to the baby's respiratory tract. Discuss this with your caregiver.  Use a condom during sexual intercourse. Following treatment, the use of condoms will help prevent reinfection.  Ask your caregiver about using over-the-counter anti-itch creams. SEEK MEDICAL CARE IF:   Your treated skin becomes red, swollen, or painful.  You have a fever.  You feel generally ill.  You feel little lumps in and around your genital area.  You are bleeding or have painful sexual intercourse. MAKE SURE YOU:   Understand these instructions.  Will watch your condition.  Will get help right away if you are not doing well or get worse. Document Released: 02/01/2000 Document Revised: 06/20/2013 Document Reviewed: 08/12/2010 [employer] Patient Information 2015 [employer]. This   information is not intended to replace advice given to you by your health care provider. Make sure you discuss any questions you have with your health care provider.  

## 2013-09-24 NOTE — Progress Notes (Signed)
Patient ID: Regina Villegas, female   DOB: 09-Jul-1985, 28 y.o.   MRN: [id number]  Chief Complaint  Patient presents with  . Follow-up    Biopsy Results    HPI Regina Villegas is a 28 y.o. female.  The pathology was reviewed with the patient.  HPI  History reviewed. No pertinent past medical history.  Past Surgical History  Procedure Laterality Date  . Cesarean section      History reviewed. No pertinent family history.  Social History History  Substance Use Topics  . Smoking status: Current Some Day Smoker  . Smokeless tobacco: Not on file  . Alcohol Use: 2.0 oz/week    4 drink(s) per week    No Known Allergies  Current Outpatient Prescriptions  Medication Sig Dispense Refill  . ENSURE (ENSURE) Take 237 mLs by mouth daily.      Marland Kitchen ibuprofen (ADVIL,MOTRIN) 800 MG tablet Take 1 tablet (800 mg total) by mouth 3 (three) times daily.  21 tablet  0  . medroxyPROGESTERone (DEPO-PROVERA) 150 MG/ML injection Inject 1 mL (150 mg total) into the muscle every 3 (three) months.  1 mL  3  . traMADol (ULTRAM) 50 MG tablet Take 1 tablet (50 mg total) by mouth every 6 (six) hours as needed.  30 tablet  0   No current facility-administered medications for this visit.    Review of Systems Review of Systems Constitutional: negative for fatigue and weight loss Respiratory: negative for cough and wheezing Cardiovascular: negative for chest pain, fatigue and palpitations Gastrointestinal: negative for abdominal pain and change in bowel habits Genitourinary:negative for abnormal vaginal discharge Integument/breast: negative for nipple discharge Musculoskeletal:negative for myalgias Neurological: negative for gait problems and tremors Behavioral/Psych: negative for abusive relationship, depression Endocrine: negative for temperature intolerance     Blood pressure 131/92, pulse 77, temperature 97.3 F (36.3 C), temperature source Oral, height 5\' 3"  (1.6 m), weight 51.71 kg (114 lb), last  menstrual period 09/12/2013.  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

## 2013-09-28 ENCOUNTER — Other Ambulatory Visit: Payer: Self-pay | Admitting: *Deleted

## 2013-10-04 ENCOUNTER — Encounter (HOSPITAL_COMMUNITY): Payer: Self-pay | Admitting: *Deleted

## 2013-10-11 ENCOUNTER — Encounter (HOSPITAL_COMMUNITY): Payer: Self-pay | Admitting: Pharmacist

## 2013-10-14 ENCOUNTER — Ambulatory Visit (HOSPITAL_COMMUNITY): Payer: 59 | Admitting: Anesthesiology

## 2013-10-14 ENCOUNTER — Encounter (HOSPITAL_COMMUNITY): Payer: 59 | Admitting: Anesthesiology

## 2013-10-14 ENCOUNTER — Encounter (HOSPITAL_COMMUNITY)
Admission: RE | Disposition: A | Discharge status: Home / Self Care | Payer: Self-pay | Source: Ambulatory Visit | Attending: Obstetrics & Gynecology

## 2013-10-14 ENCOUNTER — Ambulatory Visit (HOSPITAL_COMMUNITY)
Admission: RE | Admit: 2013-10-14 | Discharge: 2013-10-14 | Disposition: A | Discharge status: Home / Self Care | Payer: 59 | Source: Ambulatory Visit | Attending: Obstetrics & Gynecology | Admitting: Obstetrics & Gynecology

## 2013-10-14 DIAGNOSIS — N901 Moderate vulvar dysplasia: Secondary | ICD-10-CM | POA: Diagnosis present

## 2013-10-14 DIAGNOSIS — F341 Dysthymic disorder: Secondary | ICD-10-CM | POA: Insufficient documentation

## 2013-10-14 DIAGNOSIS — A630 Anogenital (venereal) warts: Secondary | ICD-10-CM | POA: Diagnosis not present

## 2013-10-14 DIAGNOSIS — Z87891 Personal history of nicotine dependence: Secondary | ICD-10-CM | POA: Diagnosis not present

## 2013-10-14 HISTORY — PX: VULVAR LESION REMOVAL: SHX5391

## 2013-10-14 LAB — CBC
HCT: 39.2 % (ref 36.0–46.0)
HEMOGLOBIN: 14.2 g/dL (ref 12.0–15.0)
MCH: 33.8 pg (ref 26.0–34.0)
MCHC: 36.2 g/dL — ABNORMAL HIGH (ref 30.0–36.0)
MCV: 93.3 fL (ref 78.0–100.0)
Platelets: 198 10*3/uL (ref 150–400)
RBC: 4.2 MIL/uL (ref 3.87–5.11)
RDW: 13.8 % (ref 11.5–15.5)
WBC: 5.8 10*3/uL (ref 4.0–10.5)

## 2013-10-14 LAB — PREGNANCY, URINE: PREG TEST UR: NEGATIVE

## 2013-10-14 SURGERY — VULVAR LESION
Anesthesia: Monitor Anesthesia Care | Laterality: Left

## 2013-10-14 MED ORDER — SODIUM CHLORIDE 0.9 % IJ SOLN: 3.0000 mL | Freq: Two times a day (BID) | INTRAMUSCULAR | Status: DC

## 2013-10-14 MED ORDER — ONDANSETRON HCL 4 MG/2ML IJ SOLN
INTRAMUSCULAR | Status: AC
  Filled 2013-10-14: qty 2

## 2013-10-14 MED ORDER — OXYCODONE HCL 5 MG PO TABS: 5.0000 mg | ORAL_TABLET | ORAL | Status: DC | PRN

## 2013-10-14 MED ORDER — OXYCODONE-ACETAMINOPHEN 5-325 MG PO TABS
1.0000 | ORAL_TABLET | ORAL | Status: DC | PRN
  Administered 2013-10-14: 1 via ORAL

## 2013-10-14 MED ORDER — BUPIVACAINE HCL 0.5 % IJ SOLN
INTRAMUSCULAR | Status: DC | PRN
  Administered 2013-10-14 (×2): 5 mL

## 2013-10-14 MED ORDER — SCOPOLAMINE 1 MG/3DAYS TD PT72
1.0000 | MEDICATED_PATCH | Freq: Once | TRANSDERMAL | Status: DC
  Administered 2013-10-14: 1.5 mg via TRANSDERMAL

## 2013-10-14 MED ORDER — LIDOCAINE HCL 1 % IJ SOLN
INTRAMUSCULAR | Status: DC | PRN
  Administered 2013-10-14 (×2): 5 mL

## 2013-10-14 MED ORDER — PROPOFOL 10 MG/ML IV EMUL
INTRAVENOUS | Status: AC
  Filled 2013-10-14: qty 20

## 2013-10-14 MED ORDER — FENTANYL CITRATE 0.05 MG/ML IJ SOLN: 25.0000 ug | INTRAMUSCULAR | Status: DC | PRN

## 2013-10-14 MED ORDER — KETOROLAC TROMETHAMINE 30 MG/ML IJ SOLN: 15.0000 mg | Freq: Once | INTRAMUSCULAR | Status: DC | PRN

## 2013-10-14 MED ORDER — DEXAMETHASONE SODIUM PHOSPHATE 10 MG/ML IJ SOLN
INTRAMUSCULAR | Status: AC
  Filled 2013-10-14: qty 1

## 2013-10-14 MED ORDER — SODIUM CHLORIDE 0.9 % IV SOLN: 250.0000 mL | INTRAVENOUS | Status: DC | PRN

## 2013-10-14 MED ORDER — ACETIC ACID 5 % SOLN
Status: DC | PRN
  Administered 2013-10-14: 1 via TOPICAL

## 2013-10-14 MED ORDER — OXYCODONE-ACETAMINOPHEN 5-325 MG PO TABS
ORAL_TABLET | ORAL | Status: AC
  Filled 2013-10-14: qty 1

## 2013-10-14 MED ORDER — ACETAMINOPHEN 325 MG PO TABS: 650.0000 mg | ORAL_TABLET | ORAL | Status: DC | PRN

## 2013-10-14 MED ORDER — FENTANYL CITRATE 0.05 MG/ML IJ SOLN
INTRAMUSCULAR | Status: AC
  Filled 2013-10-14: qty 2

## 2013-10-14 MED ORDER — LACTATED RINGERS IV SOLN
INTRAVENOUS | Status: DC
  Administered 2013-10-14: 12:00:00 via INTRAVENOUS

## 2013-10-14 MED ORDER — OXYCODONE-ACETAMINOPHEN 5-325 MG PO TABS: 2.0000 | ORAL_TABLET | Freq: Four times a day (QID) | ORAL | Status: DC | PRN

## 2013-10-14 MED ORDER — PROPOFOL 10 MG/ML IV EMUL
INTRAVENOUS | Status: DC | PRN
  Administered 2013-10-14 (×4): 20 mg via INTRAVENOUS
  Administered 2013-10-14: 40 mg via INTRAVENOUS
  Administered 2013-10-14 (×2): 20 mg via INTRAVENOUS

## 2013-10-14 MED ORDER — SCOPOLAMINE 1 MG/3DAYS TD PT72
MEDICATED_PATCH | TRANSDERMAL | Status: AC
  Administered 2013-10-14: 1.5 mg via TRANSDERMAL
  Filled 2013-10-14: qty 1

## 2013-10-14 MED ORDER — MIDAZOLAM HCL 2 MG/2ML IJ SOLN
INTRAMUSCULAR | Status: AC
  Filled 2013-10-14: qty 2

## 2013-10-14 MED ORDER — PROMETHAZINE HCL 25 MG/ML IJ SOLN: 6.2500 mg | INTRAMUSCULAR | Status: DC | PRN

## 2013-10-14 MED ORDER — BUPIVACAINE HCL (PF) 0.5 % IJ SOLN
INTRAMUSCULAR | Status: AC
  Filled 2013-10-14: qty 30

## 2013-10-14 MED ORDER — DEXAMETHASONE SODIUM PHOSPHATE 10 MG/ML IJ SOLN
INTRAMUSCULAR | Status: DC | PRN
  Administered 2013-10-14: 5 mg via INTRAVENOUS

## 2013-10-14 MED ORDER — BACITRACIN ZINC 500 UNIT/GM EX OINT
TOPICAL_OINTMENT | CUTANEOUS | Status: AC
  Filled 2013-10-14: qty 15

## 2013-10-14 MED ORDER — ACETAMINOPHEN 650 MG RE SUPP
650.0000 mg | RECTAL | Status: DC | PRN
  Filled 2013-10-14: qty 1

## 2013-10-14 MED ORDER — ONDANSETRON HCL 4 MG/2ML IJ SOLN
INTRAMUSCULAR | Status: DC | PRN
  Administered 2013-10-14: 4 mg via INTRAVENOUS

## 2013-10-14 MED ORDER — KETOROLAC TROMETHAMINE 30 MG/ML IJ SOLN
INTRAMUSCULAR | Status: DC | PRN
  Administered 2013-10-14: 30 mg via INTRAVENOUS

## 2013-10-14 MED ORDER — SODIUM CHLORIDE 0.9 % IJ SOLN: 3.0000 mL | INTRAMUSCULAR | Status: DC | PRN

## 2013-10-14 MED ORDER — ACETIC ACID 5 % SOLN
Status: AC
  Filled 2013-10-14: qty 500

## 2013-10-14 MED ORDER — MEPERIDINE HCL 25 MG/ML IJ SOLN: 6.2500 mg | INTRAMUSCULAR | Status: DC | PRN

## 2013-10-14 MED ORDER — MIDAZOLAM HCL 2 MG/2ML IJ SOLN
INTRAMUSCULAR | Status: DC | PRN
  Administered 2013-10-14: 2 mg via INTRAVENOUS

## 2013-10-14 MED ORDER — FENTANYL CITRATE 0.05 MG/ML IJ SOLN
INTRAMUSCULAR | Status: DC | PRN
  Administered 2013-10-14 (×2): 25 ug via INTRAVENOUS
  Administered 2013-10-14: 50 ug via INTRAVENOUS

## 2013-10-14 MED ORDER — LIDOCAINE HCL 1 % IJ SOLN
INTRAMUSCULAR | Status: AC
  Filled 2013-10-14: qty 20

## 2013-10-14 MED ORDER — LIDOCAINE HCL (CARDIAC) 20 MG/ML IV SOLN
INTRAVENOUS | Status: DC | PRN
  Administered 2013-10-14: 30 mg via INTRAVENOUS

## 2013-10-14 MED ORDER — LIDOCAINE HCL (CARDIAC) 20 MG/ML IV SOLN
INTRAVENOUS | Status: AC
  Filled 2013-10-14: qty 5

## 2013-10-14 SURGICAL SUPPLY — 25 items
CATH ROBINSON RED A/P 16FR (CATHETERS) ×3 IMPLANT
CLOTH BEACON ORANGE TIMEOUT ST (SAFETY) ×3 IMPLANT
COUNTER NEEDLE 1200 MAGNETIC (NEEDLE) ×3 IMPLANT
DECANTER SPIKE VIAL GLASS SM (MISCELLANEOUS) IMPLANT
DRAPE LG THREE QUARTER DISP (DRAPES) ×6 IMPLANT
EVACUATOR PREFILTER SMOKE (MISCELLANEOUS) IMPLANT
GLOVE BIO SURGEON STRL SZ 6.5 (GLOVE) ×2 IMPLANT
GLOVE BIO SURGEONS STRL SZ 6.5 (GLOVE) ×1
GOWN STRL REUS W/TWL LRG LVL3 (GOWN DISPOSABLE) ×6 IMPLANT
HOSE NS SMOKE EVAC 7/8 X6 (MISCELLANEOUS) IMPLANT
HOSE NS SMOKE EVAC 7/8 X6' (MISCELLANEOUS)
NEEDLE HYPO 22GX1.5 SAFETY (NEEDLE) IMPLANT
PACK VAGINAL MINOR WOMEN LF (CUSTOM PROCEDURE TRAY) ×3 IMPLANT
PAD OB MATERNITY 4.3X12.25 (PERSONAL CARE ITEMS) ×3 IMPLANT
PENCIL BUTTON HOLSTER BLD 10FT (ELECTRODE) IMPLANT
REDUCER FITTING SMOKE EVAC (MISCELLANEOUS) IMPLANT
SCRUB PCMX 4 OZ (MISCELLANEOUS) ×3 IMPLANT
SUT CHROMIC 3 0 SH 27 (SUTURE) IMPLANT
SUT VIC AB 3-0 SH 27 (SUTURE)
SUT VIC AB 3-0 SH 27X BRD (SUTURE) IMPLANT
TOWEL OR 17X24 6PK STRL BLUE (TOWEL DISPOSABLE) ×6 IMPLANT
TUBING NON-CON 1/4 X 20 CONN (TUBING) IMPLANT
TUBING NON-CON 1/4 X 20' CONN (TUBING)
WATER STERILE IRR 1000ML POUR (IV SOLUTION) ×3 IMPLANT
YANKAUER SUCT BULB TIP NO VENT (SUCTIONS) IMPLANT

## 2013-10-14 NOTE — Transfer of Care (Signed)
Immediate Anesthesia Transfer of Care Note  Patient: Regina Villegas  Procedure(s) Performed: Procedure(s): VULVAR LESION (Left)  Patient Location: PACU  Anesthesia Type:MAC  Level of Consciousness: awake and alert   Airway & Oxygen Therapy: Patient Spontanous Breathing and Patient connected to nasal cannula oxygen  Post-op Assessment: Report given to PACU RN and Post -op Vital signs reviewed and stable  Post vital signs: Reviewed and stable  Complications: pt reported she bit the inside of her left cheek at some point during procedure.  MDA notified.

## 2013-10-14 NOTE — H&P (Signed)
  Chief Complaint: 28 y.o.  presents with VIN II who presents for a wide local incision  Details of Present Illness: She is s/p a biopsy of a left-sided lesion of the vulva.  The pathology showed VIN II, no evidence of carcinoma with positive margins.  BP 118/85  Pulse 72  Temp(Src) 98.2 F (36.8 C) (Oral)  Resp 18  Ht 5\' 3"  (1.6 m)  Wt 54.432 kg (120 lb)  BMI 21.26 kg/m2  SpO2 96%  LMP 09/12/2013  Past Medical History  Diagnosis Date  . Depression     no meds  . Anxiety     no meds  . Headache(784.0)   . Anemia     hx   History   Social History  . Marital Status: Divorced    Spouse Name: N/A    Number of Children: N/A  . Years of Education: N/A   Occupational History  . Not on file.   Social History Main Topics  . Smoking status: Former Smoker -- 0.25 packs/day for 7 years    Types: Cigarettes    Quit date: 09/24/2013  . Smokeless tobacco: Never Used  . Alcohol Use: 2.0 oz/week    4 drink(s) per week  . Drug Use: Yes    Special: Marijuana     Comment: Socially - last use 09/24/13  . Sexual Activity: Not Currently    Birth Control/ Protection: Abstinence, None   Other Topics Concern  . Not on file   Social History Narrative  . No narrative on file   History reviewed. No pertinent family history.  Pertinent items are noted in HPI.  Pre-Op Diagnosis: VIN II  Planned Procedure: Procedure(s): Wide local excision, vulva  I have reviewed the patient's history and have completed the physical exam and Regina Villegas is acceptable for surgery.  Agnes Lawrence, MD 10/14/2013 12:10 PM

## 2013-10-14 NOTE — Transfer of Care (Deleted)
Immediate Anesthesia Transfer of Care Note  Patient: Regina Villegas  Procedure(s) Performed: Procedure(s): VULVAR LESION (Left)  Patient Location: PACU  Anesthesia Type:MAC  Level of Consciousness: awake and alert   Airway & Oxygen Therapy: Patient Spontanous Breathing and Patient connected to nasal cannula oxygen  Post-op Assessment: Report given to PACU RN and Post -op Vital signs reviewed and stable  Post vital signs: Reviewed and stable  Complications: No apparent anesthesia complications

## 2013-10-14 NOTE — Anesthesia Preprocedure Evaluation (Signed)
Anesthesia Evaluation  Patient identified by MRN, date of birth, ID band Patient awake    Reviewed: Allergy & Precautions, H&P , NPO status , Patient's Chart, lab work & pertinent test results, reviewed documented beta blocker date and time   History of Anesthesia Complications Negative for: history of anesthetic complications  Airway Mallampati: I TM Distance: >3 FB Neck ROM: full    Dental  (+) Teeth Intact   Pulmonary former smoker (quit 8/15),  breath sounds clear to auscultation  Pulmonary exam normal       Cardiovascular Rhythm:regular Rate:Normal     Neuro/Psych  Headaches (migraines 2-3/month), Anxiety Depression    GI/Hepatic negative GI ROS, Neg liver ROS,   Endo/Other  negative endocrine ROS  Renal/GU negative Renal ROS  Female GU complaint     Musculoskeletal   Abdominal   Peds  Hematology negative hematology ROS (+)   Anesthesia Other Findings   Reproductive/Obstetrics negative OB ROS                           Anesthesia Physical Anesthesia Plan  ASA: II  Anesthesia Plan: MAC   Post-op Pain Management:    Induction:   Airway Management Planned:   Additional Equipment:   Intra-op Plan:   Post-operative Plan:   Informed Consent: I have reviewed the patients History and Physical, chart, labs and discussed the procedure including the risks, benefits and alternatives for the proposed anesthesia with the patient or authorized representative who has indicated his/her understanding and acceptance.   Dental Advisory Given  Plan Discussed with: CRNA and Surgeon  Anesthesia Plan Comments:         Anesthesia Quick Evaluation

## 2013-10-14 NOTE — Anesthesia Postprocedure Evaluation (Signed)
  Anesthesia Post-op Note  Anesthesia Post Note  Patient: Regina Villegas  Procedure(s) Performed: Procedure(s) (LRB): VULVAR LESION (Left)  Anesthesia type: MAC  Patient location: PACU  Post pain: Pain level controlled  Post assessment: Post-op Vital signs reviewed  Last Vitals:  Filed Vitals:   10/14/13 1400  BP: 121/66  Pulse: 71  Temp:   Resp: 16    Post vital signs: Reviewed  Level of consciousness: sedated  Complications: No apparent anesthesia complications

## 2013-10-14 NOTE — Op Note (Signed)
Procedure(s): VULVAR LESION Procedure Note  Regina Villegas female 28 y.o. 10/14/2013  Procedure(s) and Anesthesia Type:    * VULVAR LESION - Choice  Surgeon(s) and Role:    * Lahoma Crocker, MD - Primary   Indications: The patient had a recent biopsy a left-sided lesion on the vulva that was diagnostic of VIN II with positive margins. .  The patient now presents for a wide local excision        Surgeon: Lahoma Crocker A   Assistants: None  Anesthesia: Local anesthesia 1% buffered lidocaine, 0.5% bupivacaine and Monitored Local Anesthesia with Sedation  ASA Class: 1    Procedure Detail  The patient was brought to the OR with an IV running.  She was placed in semi-lithotomy position and prepped and draped.  A time-out was completed confirming the patient, procedure and allergy status.  The lesions were infiltrated with a mixture of 0.5% Marcaine/1% Lidocaine.  Bilateral elliptical skin incisions were made with the scalpel.  The skin was excised.  The incisions were closed with interrupted 2-0 Monocryl sutures.  Adequate hemostasis was noted.  At the close of the procedure, the instrument and pack counts were correct x 2.  The patient was taken to the PACU awake and in stable condition.   VULVAR LESION/CONDYLOMATA  Findings: After acetic acid was applied, a flat, aceto white 1 x 1 cm area on the right labia majus, posteriorly with discrete borders was noted.  On the left, the scar from previous biopsy was noted.  There was a rim of aceto white margin was noted on the left aspect.  A 1 cm margin was drawn to demarcate the incision.  Estimated Blood Loss:  Minimal         Drains: None         Total IV Fluids: per Anesthesiology  Blood Given: none          Specimens: Bilateral excisional biopsies of the vulva         Implants: none        Complications:  none         Disposition: PACU - hemodynamically stable.         Condition: stable

## 2013-10-14 NOTE — Discharge Instructions (Signed)
Vulva Biopsy Care After Refer to this sheet in the next few weeks. These instructions provide you with information on caring for yourself after your procedure. Your caregiver may also give you more specific instructions. Your treatment has been planned according to current medical practices, but problems sometimes occur. Call your caregiver if you have any problems or questions after your procedure.  HOME CARE INSTRUCTIONS  Only take over-the-counter or prescription medicines for pain or discomfort as directed by your caregiver.  Do not rub the biopsy area after urinating. Gently pat the area dry or use a bottle filled with warm water (peri-bottle) to clean the area. Gently wipe from front to back.  Clean the area using water and mild soap. Gently pat the area dry.  Check your biopsy site with a handheld mirror daily to be sure it is healing properly.  Do not have intercourse for 3-4 days or as directed by your caregiver.  Avoid exercise, such as running or biking, for 2-3 days or as directed by your caregiver.  You may shower after the procedure. Avoid bathing and swimming until the sutures dissolve and healing is complete.  Wear loose cotton underwear. Do not wear tight pants.  Keep all follow-up appointments with your caregiver.  Contact your caregiver to obtain your test results. SEEK IMMEDIATE MEDICAL CARE IF:  Your pain increases and medicine does not help it.  Your vaginal area becomes swollen or red.  You have fluid or an abnormally bad smell coming from your vagina.  You have a fever. MAKE SURE YOU:  Understand these instructions.  Will watch your condition.  Will get help right away if you are not doing well or get worse. Document Released: 01/21/2012 Document Reviewed: 01/21/2012 [facility] Patient Information 2015 [place]. This information is not intended to replace advice given to you by your health care provider. Make sure you discuss any questions you  have with your health care provider.  Post Anesthesia Home Care Instructions  Activity: Get plenty of rest for the remainder of the day. A responsible adult should stay with you for 24 hours following the procedure.  For the next 24 hours, DO NOT: -Drive a car -Paediatric nurse -Drink alcoholic beverages -Take any medication unless instructed by your physician -Make any legal decisions or sign important papers.  Meals: Start with liquid foods such as gelatin or soup. Progress to regular foods as tolerated. Avoid greasy, spicy, heavy foods. If nausea and/or vomiting occur, drink only clear liquids until the nausea and/or vomiting subsides. Call your physician if vomiting continues.  Special Instructions/Symptoms: Your throat may feel dry or sore from the anesthesia or the breathing tube placed in your throat during surgery. If this causes discomfort, gargle with warm salt water. The discomfort should disappear within 24 hours.

## 2013-10-17 ENCOUNTER — Encounter (HOSPITAL_COMMUNITY): Payer: Self-pay | Admitting: Obstetrics & Gynecology

## 2013-10-18 ENCOUNTER — Inpatient Hospital Stay (HOSPITAL_COMMUNITY)
Admission: AD | Admit: 2013-10-18 | Discharge: 2013-10-19 | Disposition: A | Discharge status: Home / Self Care | Payer: 59 | Source: Ambulatory Visit | Attending: Obstetrics | Admitting: Obstetrics

## 2013-10-18 ENCOUNTER — Encounter (HOSPITAL_COMMUNITY): Payer: Self-pay | Admitting: *Deleted

## 2013-10-18 DIAGNOSIS — Z87891 Personal history of nicotine dependence: Secondary | ICD-10-CM | POA: Insufficient documentation

## 2013-10-18 DIAGNOSIS — G8918 Other acute postprocedural pain: Secondary | ICD-10-CM | POA: Insufficient documentation

## 2013-10-18 MED ORDER — HYDROCODONE-ACETAMINOPHEN 5-325 MG PO TABS
2.0000 | ORAL_TABLET | Freq: Once | ORAL | Status: AC
  Administered 2013-10-18: 2 via ORAL
  Filled 2013-10-18: qty 2

## 2013-10-18 NOTE — MAU Provider Note (Signed)
History     CSN: [id number]  Arrival date and time: 10/18/13 2115   First Provider Initiated Contact with Patient 10/18/13 2316      Chief Complaint  Patient presents with  . Post-op Problem   HPI Ms. Regina Villegas is a 28 y.o. [phone] who presents to MAU today with complaint of post operative pain. The patient had a vulvar lesion removed by Dr. Delsa Sale on 10/14/13. She states that she is taking Percocet at home for pain, which is helping a little bit. She does feel that the Percocet is making her itch. She states that she noted some blood from the incision site earlier and feels like the stitch is coming loose. She is having pain in the area that is not controlled with the Percocet. She states some bleeding, but denies discharge or fever.   OB History   Grav Para Term Preterm Abortions TAB SAB Ect Mult Living   3 3 3       3       Past Medical History  Diagnosis Date  . Depression     no meds  . Anxiety     no meds  . Headache(784.0)   . Anemia     hx    Past Surgical History  Procedure Laterality Date  . Cesarean section      x 3  . Iud removal      Mirena  . Wisdom tooth extraction    . Gynecologic cryosurgery    . Vulvar lesion removal Left 10/14/2013    Procedure: VULVAR LESION;  Surgeon: Lahoma Crocker, MD;  Location: [facility];  Service: Gynecology;  Laterality: Left;    History reviewed. No pertinent family history.  History  Substance Use Topics  . Smoking status: Former Smoker -- 0.25 packs/day for 7 years    Types: Cigarettes    Quit date: 09/24/2013  . Smokeless tobacco: Never Used  . Alcohol Use: 2.0 oz/week    4 drink(s) per week    Allergies: No Known Allergies  Prescriptions prior to admission  Medication Sig Dispense Refill  . ENSURE (ENSURE) Take 237 mLs by mouth daily.      . medroxyPROGESTERone (DEPO-PROVERA) 150 MG/ML injection Inject 1 mL (150 mg total) into the muscle every 3 (three) months.  1 mL  3  . oxyCODONE-acetaminophen  (PERCOCET) 5-325 MG per tablet Take 2 tablets by mouth every 6 (six) hours as needed for severe pain.  30 tablet  0    Review of Systems  Constitutional: Negative for fever and malaise/fatigue.  Gastrointestinal: Negative for abdominal pain.  Genitourinary:       + bleeding from incision Neg- drainage from incision    Physical Exam   Blood pressure 114/68, pulse 82, temperature 98.4 F (36.9 C), temperature source Oral, resp. rate 16, height 5' 3.9" (1.623 m), weight 119 lb (53.978 kg), last menstrual period 09/12/2013, SpO2 98.00%.  Physical Exam  Constitutional: She is oriented to person, place, and time. She appears well-developed and well-nourished. No distress.  HENT:  Head: Normocephalic.  Cardiovascular: Normal rate.   Respiratory: Effort normal.  Genitourinary:     Neurological: She is alert and oriented to person, place, and time.  Skin: Skin is warm and dry. No erythema.  Psychiatric: She has a normal mood and affect.    MAU Course  Procedures None  MDM Discussed with Dr. Jodi Mourning. Patient may try Vicodin for pain. Follow-up with Dr. Delsa Sale tomorrow at 11:00 am Vicodin given in MAU.  Patient reports some improvement in pain and denies itching.  Assessment and Plan  A: Post op pain s/p Vulval excisional biopsy  P: Discharge home Rx for Vicodin given to patient. Advised to discontinue Percocet at this time Patient advised to follow-up in the office with Dr. Delsa Sale tomorrow at 11:00 am Patient may return to MAU as needed or if her condition were to change or worsen  Luvenia Redden, PA-C  10/18/2013, 11:17 PM

## 2013-10-18 NOTE — MAU Note (Signed)
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 Thinks stitches have come loose. Also having a lot of pain. Taking percocet-last dose at 1930-takes the edge off but still having a lot of pain.

## 2013-10-19 ENCOUNTER — Encounter: Payer: Self-pay | Admitting: Obstetrics & Gynecology

## 2013-10-19 ENCOUNTER — Ambulatory Visit (INDEPENDENT_AMBULATORY_CARE_PROVIDER_SITE_OTHER): Payer: 59 | Admitting: Obstetrics & Gynecology

## 2013-10-19 VITALS — BP 121/86 | HR 82 | Temp 97.1°F | Ht 63.0 in | Wt 120.0 lb

## 2013-10-19 DIAGNOSIS — D071 Carcinoma in situ of vulva: Secondary | ICD-10-CM

## 2013-10-19 DIAGNOSIS — G8918 Other acute postprocedural pain: Secondary | ICD-10-CM

## 2013-10-19 DIAGNOSIS — Z09 Encounter for follow-up examination after completed treatment for conditions other than malignant neoplasm: Secondary | ICD-10-CM

## 2013-10-19 MED ORDER — HYDROCODONE-ACETAMINOPHEN 5-325 MG PO TABS: 1.0000 | ORAL_TABLET | Freq: Four times a day (QID) | ORAL | Status: DC | PRN

## 2013-10-19 MED ORDER — OXYCODONE-ACETAMINOPHEN 5-325 MG PO TABS: 1.0000 | ORAL_TABLET | ORAL | Status: DC | PRN

## 2013-10-19 NOTE — Discharge Instructions (Signed)
Vulva Biopsy Care After Refer to this sheet in the next few weeks. These instructions provide you with information on caring for yourself after your procedure. Your caregiver may also give you more specific instructions. Your treatment has been planned according to current medical practices, but problems sometimes occur. Call your caregiver if you have any problems or questions after your procedure.  HOME CARE INSTRUCTIONS  Only take over-the-counter or prescription medicines for pain or discomfort as directed by your caregiver.  Do not rub the biopsy area after urinating. Gently pat the area dry or use a bottle filled with warm water (peri-bottle) to clean the area. Gently wipe from front to back.  Clean the area using water and mild soap. Gently pat the area dry.  Check your biopsy site with a handheld mirror daily to be sure it is healing properly.  Do not have intercourse for 3-4 days or as directed by your caregiver.  Avoid exercise, such as running or biking, for 2-3 days or as directed by your caregiver.  You may shower after the procedure. Avoid bathing and swimming until the sutures dissolve and healing is complete.  Wear loose cotton underwear. Do not wear tight pants.  Keep all follow-up appointments with your caregiver.  Contact your caregiver to obtain your test results. SEEK IMMEDIATE MEDICAL CARE IF:  Your pain increases and medicine does not help it.  Your vaginal area becomes swollen or red.  You have fluid or an abnormally bad smell coming from your vagina.  You have a fever. MAKE SURE YOU:  Understand these instructions.  Will watch your condition.  Will get help right away if you are not doing well or get worse. Document Released: 01/21/2012 Document Reviewed: 01/21/2012 [employer] Patient Information 2015 [employer]. This information is not intended to replace advice given to you by your health care provider. Make sure you discuss any questions you  have with your health care provider.  

## 2013-10-21 DIAGNOSIS — D071 Carcinoma in situ of vulva: Secondary | ICD-10-CM | POA: Insufficient documentation

## 2013-10-21 NOTE — Progress Notes (Signed)
Subjective:     Regina Villegas is a 28 y.o. female who presents to the clinic s/p a wide local excisions of bilateral lesions on the vulva.   Pain is not well controlled.  Medications being used: acetaminophen.  The following portions of the patient's history were reviewed and updated as appropriate: allergies, current medications, past family history, past medical history, past social history, past surgical history and problem list.  Review of Systems Pertinent items are noted in HPI.    Objective:    BP 121/86  Pulse 82  Temp(Src) 97.1 F (36.2 C)  Ht 5\' 3"  (1.6 m)  Wt 54.432 kg (120 lb)  BMI 21.26 kg/m2  LMP 09/12/2013 General:  alert  Vulva:   right incision with small separation; left side intact; no induration, skin changes, purulent drainage       Assessment:   S/P bilateral excision of vulva lesions; no obvious signs of infection Operative findings again reviewed. 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 Continue any current medications. 2. Wound care discussed. 3. Activity restrictions: no intercourse 4. Anticipated return to work: now. 5. Follow up: 1 week  6.  Referral-->GYN ONC

## 2013-10-27 ENCOUNTER — Encounter: Payer: 59 | Admitting: Obstetrics & Gynecology

## 2013-10-27 ENCOUNTER — Ambulatory Visit: Payer: 59 | Attending: Gynecologic Oncology | Admitting: Gynecologic Oncology

## 2013-10-27 VITALS — BP 142/95 | HR 87 | Temp 98.4°F | Resp 20 | Ht 63.0 in | Wt 116.1 lb

## 2013-10-27 DIAGNOSIS — D071 Carcinoma in situ of vulva: Secondary | ICD-10-CM | POA: Diagnosis not present

## 2013-10-27 DIAGNOSIS — Z87891 Personal history of nicotine dependence: Secondary | ICD-10-CM | POA: Diagnosis not present

## 2013-10-27 NOTE — Patient Instructions (Signed)
Continue the excellent care of your perineum  Follow-up with Dr. Delsa Sale in two weeks  Follow up with Gyn Oncology in 6 months  Ibuprofen 400mg  every six hours for discomfort.    Thank you very much [name] for allowing me to provide care for you today.  I appreciate your confidence in choosing our Gynecologic Oncology team.  If you have any questions about your visit today please call our office and we will get back to you as soon as possible.  Please consider using the website Medlineplus.gov as an Geneticist, molecular.   Regina Villegas. Regina Pamer MD., PhD Gynecologic Oncology

## 2013-10-27 NOTE — Progress Notes (Signed)
Consult Note: Gyn-Onc  Consult was requested by Dr. Delsa Sale for the evaluation of Regina Villegas 28 y.o. female  CC: Multifocal VIN II VIN III  Assessment/Plan:  Ms. Regina Villegas  is a 28 y.o.   who is status post wide local excision of 2 areas on the perineum. 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 Given the positive margins have advised the patient that colposcopy of the vulva should occur in approximately 6 months. Followup in GYN oncology in 6 months for vulvar colposcopy I've advised her to followup with Dr. Delsa Sale in 2 weeks instead of the planned visit today. I've recommended that she use ibuprofen around the clock for perineal discomfort  HPI: Ms. Regina Villegas  is a 28 y.o. gravida 3 para 3 who underwent left vulvar biopsy demonstrated VIN 2 and a right vulva biopsy demonstrated condyloma on 09/12/2013. She's taken to the operating room for excision of the 2 vulvar lesions on the perineum. The left vulvar lesion was consistent with VIN 2 extending to edges of the incision no invasive carcinoma. The right vulva wart was actually VIN 3 extending to the edge of the biopsy no invasive carcinoma is identified. Patient reports discomfort at the site of the incision. She states her discomfort is worse at night. Her history is notable for 2 sexual partners in her life and history of abnormal Pap tests. Review of Systems:  Constitutional  Feels well Cardiovascular  No chest pain, Pulmonary  No cough or wheeze.  Gastro Intestinal  No nausea, vomitting, or diarrhoea. No bright red blood per rectum, no abdominal pain, change in bowel movement, or constipation.  Genito Urinary  No frequency, urgency, dysuria,reports discomfort at the surgical site. Musculo Skeletal  No myalgia, arthralgia, joint swelling or pain  Neurologic  No weakness, numbness, change in gait,    Current Meds:  Outpatient  Encounter Prescriptions as of 10/27/2013  Medication Sig  . ENSURE (ENSURE) Take 237 mLs by mouth daily.  Marland Kitchen HYDROcodone-acetaminophen (NORCO/VICODIN) 5-325 MG per tablet Take 1-2 tablets by mouth every 6 (six) hours as needed for moderate pain.  . medroxyPROGESTERone (DEPO-PROVERA) 150 MG/ML injection Inject 1 mL (150 mg total) into the muscle every 3 (three) months.  Marland Kitchen oxyCODONE-acetaminophen (PERCOCET/ROXICET) 5-325 MG per tablet Take 1 tablet by mouth every 4 (four) hours as needed for severe pain.    Allergy: No Known Allergies  Social Hx:   History   Social History  . Marital Status: Divorced    Spouse Name: N/A    Number of Children: N/A  . Years of Education: N/A   Occupational History  . Not on file.   Social History Main Topics  . Smoking status: Former Smoker -- 0.25 packs/day for 7 years    Types: Cigarettes    Quit date: 09/24/2013  . Smokeless tobacco: Never Used  . Alcohol Use: No  . Drug Use: Yes    Special: Marijuana     Comment: Socially - last use 09/24/13  . Sexual Activity: Not Currently    Birth Control/ Protection: Abstinence, None   Other Topics Concern  . Not on file   Social History Narrative  . No narrative on file    Past Surgical Hx:  Past Surgical History  Procedure Laterality Date  . Cesarean section      x 3  . Iud removal      Mirena  . Wisdom tooth extraction    .  Gynecologic cryosurgery    . Vulvar lesion removal Left 10/14/2013    Procedure: VULVAR LESION;  Surgeon: Lahoma Crocker, MD;  Location: [facility];  Service: Gynecology;  Laterality: Left;    Past Medical Hx:  Past Medical History  Diagnosis Date  . Depression     no meds  . Anxiety     no meds  . Headache(784.0)   . Anemia     hx    Past Gynecological History: G3P3  Patient's last menstrual period was 09/12/2013. Menarche 8, regular monthly periods.  2 sexual partners. H/o abnormal pap tests.  Family Hx: No family history on file.  Vitals:  Blood pressure  142/95, pulse 87, temperature 98.4 F (36.9 C), temperature source Oral, resp. rate 20, height 5\' 3"  (1.6 m), weight 116 lb 1.6 oz (52.663 kg), last menstrual period 09/12/2013.  Physical Exam: WD in NAD Neck  Supple NROM, without any enlargements.  Lymph Node Survey No cervical supraclavicular adenopathy.  Right inguinal mobile non tender  6mm lymph node Lungs  Clear to auscultation bilaterally, without wheezes/crackles/rhonchi. Good air movement.  Skin  No rash/lesions/breakdown  Psychiatry  Alert appropriate mood and affect. Back No CVA tenderness Genito Urinary  Vulva/vagina: Normal external female genitalia.  Surgical sites visible at perineum healing well.  Clean, no erythema, no lymphangitic streaking Extremities  No bilateral cyanosis, clubbing or edema.   [name], MD, PhD 10/27/2013, 1:14 PM

## 2013-10-28 ENCOUNTER — Ambulatory Visit: Payer: 59 | Admitting: Gynecologic Oncology

## 2013-11-03 ENCOUNTER — Ambulatory Visit: Payer: 59 | Admitting: Psychiatry

## 2013-11-10 ENCOUNTER — Encounter: Payer: 59 | Admitting: Obstetrics & Gynecology

## 2013-11-11 ENCOUNTER — Ambulatory Visit: Payer: 59 | Admitting: Obstetrics & Gynecology

## 2013-11-11 VITALS — BP 111/71 | HR 75 | Temp 98.5°F | Wt 119.0 lb

## 2013-11-11 DIAGNOSIS — D071 Carcinoma in situ of vulva: Secondary | ICD-10-CM

## 2013-11-11 DIAGNOSIS — Z09 Encounter for follow-up examination after completed treatment for conditions other than malignant neoplasm: Secondary | ICD-10-CM

## 2013-11-11 DIAGNOSIS — N901 Moderate vulvar dysplasia: Secondary | ICD-10-CM

## 2013-11-11 NOTE — Patient Instructions (Signed)
Insomnia Insomnia is frequent trouble falling and/or staying asleep. Insomnia can be a long term problem or a short term problem. Both are common. Insomnia can be a short term problem when the wakefulness is related to a certain stress or worry. Long term insomnia is often related to ongoing stress during waking hours and/or poor sleeping habits. Overtime, sleep deprivation itself can make the problem worse. Every little thing feels more severe because you are overtired and your ability to cope is decreased. CAUSES   Stress, anxiety, and depression.  Poor sleeping habits.  Distractions such as TV in the bedroom.  Naps close to bedtime.  Engaging in emotionally charged conversations before bed.  Technical reading before sleep.  Alcohol and other sedatives. They may make the problem worse. They can hurt normal sleep patterns and normal dream activity.  Stimulants such as caffeine for several hours prior to bedtime.  Pain syndromes and shortness of breath can cause insomnia.  Exercise late at night.  Changing time zones may cause sleeping problems (jet lag). It is sometimes helpful to have someone observe your sleeping patterns. They should look for periods of not breathing during the night (sleep apnea). They should also look to see how long those periods last. If you live alone or observers are uncertain, you can also be observed at a sleep clinic where your sleep patterns will be professionally monitored. Sleep apnea requires a checkup and treatment. Give your caregivers your medical history. Give your caregivers observations your family has made about your sleep.  SYMPTOMS   Not feeling rested in the morning.  Anxiety and restlessness at bedtime.  Difficulty falling and staying asleep. TREATMENT   Your caregiver may prescribe treatment for an underlying medical disorders. Your caregiver can give advice or help if you are using alcohol or other drugs for self-medication. Treatment  of underlying problems will usually eliminate insomnia problems.  Medications can be prescribed for short time use. They are generally not recommended for lengthy use.  Over-the-counter sleep medicines are not recommended for lengthy use. They can be habit forming.  You can promote easier sleeping by making lifestyle changes such as:  Using relaxation techniques that help with breathing and reduce muscle tension.  Exercising earlier in the day.  Changing your diet and the time of your last meal. No night time snacks.  Establish a regular time to go to bed.  Counseling can help with stressful problems and worry.  Soothing music and white noise may be helpful if there are background noises you cannot remove.  Stop tedious detailed work at least one hour before bedtime. HOME CARE INSTRUCTIONS   Keep a diary. Inform your caregiver about your progress. This includes any medication side effects. See your caregiver regularly. Take note of:  Times when you are asleep.  Times when you are awake during the night.  The quality of your sleep.  How you feel the next day. This information will help your caregiver care for you.  Get out of bed if you are still awake after 15 minutes. Read or do some quiet activity. Keep the lights down. Wait until you feel sleepy and go back to bed.  Keep regular sleeping and waking hours. Avoid naps.  Exercise regularly.  Avoid distractions at bedtime. Distractions include watching television or engaging in any intense or detailed activity like attempting to balance the household checkbook.  Develop a bedtime ritual. Keep a familiar routine of bathing, brushing your teeth, climbing into bed at the same   time each night, listening to soothing music. Routines increase the success of falling to sleep faster.  Use relaxation techniques. This can be using breathing and muscle tension release routines. It can also include visualizing peaceful scenes. You can  also help control troubling or intruding thoughts by keeping your mind occupied with boring or repetitive thoughts like the old concept of counting sheep. You can make it more creative like imagining planting one beautiful flower after another in your backyard garden.  During your day, work to eliminate stress. When this is not possible use some of the previous suggestions to help reduce the anxiety that accompanies stressful situations. MAKE SURE YOU:   Understand these instructions.  Will watch your condition.  Will get help right away if you are not doing well or get worse. Document Released: 02/01/2000 Document Revised: 04/28/2011 Document Reviewed: 03/03/2007 Exit[employer] Patient Information 2015 [employer]. This information is not intended to replace advice given to you by your health care provider. Make sure you discuss any questions you have with your health care provider.  

## 2013-11-13 ENCOUNTER — Encounter: Payer: Self-pay | Admitting: Obstetrics & Gynecology

## 2013-11-13 NOTE — Progress Notes (Unsigned)
Subjective:     Regina Villegas is a 28 y.o. female who presents to the clinic s/p a wide local excisions of bilateral lesions on the vulva.   She reports less pain; incisions healing well  The following portions of the patient's history were reviewed and updated as appropriate: allergies, current medications, past family history, past medical history, past social history, past surgical history and problem list.  Review of Systems Pertinent items are noted in HPI.    Objective:    BP 111/71  Pulse 75  Temp(Src) 98.5 F (36.9 C)  Wt 53.978 kg (119 lb)  LMP 10/20/2013 General:  alert  Vulva:   incisions intact, NT       Assessment:   S/P bilateral excision of vulva lesions; incisions well-healed    Plan:  Return prn Encouraged f/u w/GYN ONC

## 2013-12-19 ENCOUNTER — Encounter: Payer: Self-pay | Admitting: Obstetrics & Gynecology

## 2014-02-13 ENCOUNTER — Encounter: Payer: Self-pay | Admitting: *Deleted

## 2014-02-14 ENCOUNTER — Encounter: Payer: Self-pay | Admitting: Obstetrics & Gynecology

## 2014-03-17 ENCOUNTER — Ambulatory Visit: Payer: 59 | Attending: Gynecologic Oncology | Admitting: Gynecologic Oncology

## 2014-03-17 ENCOUNTER — Encounter: Payer: Self-pay | Admitting: Gynecologic Oncology

## 2014-03-17 VITALS — BP 132/85 | HR 73 | Temp 98.4°F | Resp 18 | Ht 63.0 in | Wt 120.8 lb

## 2014-03-17 DIAGNOSIS — Z87891 Personal history of nicotine dependence: Secondary | ICD-10-CM | POA: Diagnosis not present

## 2014-03-17 DIAGNOSIS — D071 Carcinoma in situ of vulva: Secondary | ICD-10-CM | POA: Insufficient documentation

## 2014-03-17 DIAGNOSIS — N901 Moderate vulvar dysplasia: Secondary | ICD-10-CM | POA: Insufficient documentation

## 2014-03-17 DIAGNOSIS — F329 Major depressive disorder, single episode, unspecified: Secondary | ICD-10-CM | POA: Diagnosis not present

## 2014-03-17 DIAGNOSIS — F419 Anxiety disorder, unspecified: Secondary | ICD-10-CM | POA: Diagnosis not present

## 2014-03-17 DIAGNOSIS — Z87412 Personal history of vulvar dysplasia: Secondary | ICD-10-CM

## 2014-03-17 NOTE — Progress Notes (Signed)
Followup Note: Gyn-Onc  CC: Multifocal VIN II VIN III  Assessment/Plan:  Ms. Regina Villegas  is a 29 y.o.   who is status post wide local excision of 2 areas on the perineum. 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 She had positive margins on excision. She is NED on today's exam.  Given the positive margins have advised the patient that examination of the vulva with acetic acid should occur in approximately 6 months. Followup in GYN oncology in 6 months for vulvar colposcopy with acetic acid  HPI: Ms. Regina Villegas  is a 29 y.o. gravida 3 para 3 who underwent left vulvar biopsy demonstrated VIN 2 and a right vulva biopsy demonstrated condyloma on 09/12/2013. She's taken to the operating room for excision of the 2 vulvar lesions on the perineum. The left vulvar lesion was consistent with VIN 2 extending to edges of the incision no invasive carcinoma. The right vulva wart was actually VIN 3 extending to the edge of the biopsy no invasive carcinoma is identified. Patient reports discomfort at the site of the incision. She states her discomfort is worse at night. Her history is notable for 2 sexual partners in her life and history of abnormal Pap tests. Review of Systems:  Constitutional  Feels well Cardiovascular  No chest pain, Pulmonary  No cough or wheeze.  Gastro Intestinal  No nausea, vomitting, or diarrhoea. No bright red blood per rectum, no abdominal pain, change in bowel movement, or constipation.  Genito Urinary  No frequency, urgency, dysuria,reports discomfort at the surgical site. Musculo Skeletal  No myalgia, arthralgia, joint swelling or pain  Neurologic  No weakness, numbness, change in gait,    Current Meds:  Outpatient Encounter Prescriptions as of 03/17/2014  Medication Sig  . ENSURE (ENSURE) Take 237 mLs by mouth daily.  . [DISCONTINUED] HYDROcodone-acetaminophen (NORCO/VICODIN) 5-325 MG per tablet Take 1-2 tablets by mouth every 6 (six) hours  as needed for moderate pain.  . [DISCONTINUED] medroxyPROGESTERone (DEPO-PROVERA) 150 MG/ML injection Inject 1 mL (150 mg total) into the muscle every 3 (three) months.  . [DISCONTINUED] Multiple Vitamins-Minerals (MULTIVITAMIN WITH MINERALS) tablet Take 1 tablet by mouth daily.  . [DISCONTINUED] oxyCODONE-acetaminophen (PERCOCET/ROXICET) 5-325 MG per tablet Take 1 tablet by mouth every 4 (four) hours as needed for severe pain.    Allergy: No Known Allergies  Social Hx:   History   Social History  . Marital Status: Divorced    Spouse Name: N/A    Number of Children: N/A  . Years of Education: N/A   Occupational History  . Not on file.   Social History Main Topics  . Smoking status: Former Smoker -- 0.25 packs/day for 7 years    Types: Cigarettes    Quit date: 09/24/2013  . Smokeless tobacco: Never Used  . Alcohol Use: No  . Drug Use: Yes    Special: Marijuana     Comment: Socially - last use 09/24/13  . Sexual Activity: Not Currently    Birth Control/ Protection: Abstinence, None   Other Topics Concern  . Not on file   Social History Narrative    Past Surgical Hx:  Past Surgical History  Procedure Laterality Date  . Cesarean section      x 3  . Iud removal      Mirena  . Wisdom tooth extraction    . Gynecologic cryosurgery    . Vulvar lesion removal Left 10/14/2013    Procedure: VULVAR LESION;  Surgeon: Lattie Haw  Delsa Sale, MD;  Location: [facility];  Service: Gynecology;  Laterality: Left;    Past Medical Hx:  Past Medical History  Diagnosis Date  . Depression     no meds  . Anxiety     no meds  . Headache(784.0)   . Anemia     hx    Past Gynecological History: G3P3  No LMP recorded. Menarche 8, regular monthly periods.  2 sexual partners. H/o abnormal pap tests.  Family Hx: History reviewed. No pertinent family history.  Vitals:  Blood pressure 132/85, pulse 73, temperature 98.4 F (36.9 C), temperature source Oral, resp. rate 18, height 5\' 3"  (1.6 m),  weight 120 lb 12.8 oz (54.795 kg), SpO2 100 %.  Physical Exam: WD in NAD Neck  Supple NROM, without any enlargements.  Lymph Node Survey No cervical supraclavicular adenopathy.  Right inguinal mobile non tender  37mm lymph node Lungs  Clear to auscultation bilaterally, without wheezes/crackles/rhonchi. Good air movement.  Skin  No rash/lesions/breakdown  Psychiatry  Alert appropriate mood and affect. Back No CVA tenderness Genito Urinary  Vulva/vagina: Normal external female genitalia.  Surgical sites healed.  4% acetic acid applied to vulvar tissues. No acetowhite lesions identified. No raised or erythematous lesions.  Speculum examination: normal vaginal tissues, normal cervix. No lesions Extremities  No bilateral cyanosis, clubbing or edema.   Donaciano Eva, MD, PhD 03/17/2014, 4:42 PM

## 2014-03-17 NOTE — Patient Instructions (Signed)
Follow up in six months.  Call closer to the date to schedule.  Please call for any questions or concerns.

## 2014-06-27 ENCOUNTER — Encounter (HOSPITAL_COMMUNITY): Payer: Self-pay | Admitting: *Deleted

## 2014-06-27 ENCOUNTER — Inpatient Hospital Stay (HOSPITAL_COMMUNITY)
Admission: AD | Admit: 2014-06-27 | Discharge: 2014-06-27 | Disposition: A | Discharge status: Home / Self Care | Payer: Self-pay | Source: Ambulatory Visit | Attending: Obstetrics | Admitting: Obstetrics

## 2014-06-27 DIAGNOSIS — R109 Unspecified abdominal pain: Secondary | ICD-10-CM | POA: Insufficient documentation

## 2014-06-27 DIAGNOSIS — N939 Abnormal uterine and vaginal bleeding, unspecified: Secondary | ICD-10-CM | POA: Insufficient documentation

## 2014-06-27 DIAGNOSIS — A5901 Trichomonal vulvovaginitis: Secondary | ICD-10-CM

## 2014-06-27 DIAGNOSIS — R110 Nausea: Secondary | ICD-10-CM | POA: Insufficient documentation

## 2014-06-27 DIAGNOSIS — N921 Excessive and frequent menstruation with irregular cycle: Secondary | ICD-10-CM

## 2014-06-27 DIAGNOSIS — N946 Dysmenorrhea, unspecified: Secondary | ICD-10-CM

## 2014-06-27 LAB — URINALYSIS, ROUTINE W REFLEX MICROSCOPIC
Bilirubin Urine: NEGATIVE
GLUCOSE, UA: NEGATIVE mg/dL
Ketones, ur: NEGATIVE mg/dL
Leukocytes, UA: NEGATIVE
Nitrite: NEGATIVE
Protein, ur: NEGATIVE mg/dL
UROBILINOGEN UA: 0.2 mg/dL (ref 0.0–1.0)
pH: 5 (ref 5.0–8.0)

## 2014-06-27 LAB — CBC
HEMATOCRIT: 36.6 % (ref 36.0–46.0)
Hemoglobin: 13.1 g/dL (ref 12.0–15.0)
MCH: 32.8 pg (ref 26.0–34.0)
MCHC: 35.8 g/dL (ref 30.0–36.0)
MCV: 91.5 fL (ref 78.0–100.0)
PLATELETS: 219 10*3/uL (ref 150–400)
RBC: 4 MIL/uL (ref 3.87–5.11)
RDW: 13.8 % (ref 11.5–15.5)
WBC: 6.4 10*3/uL (ref 4.0–10.5)

## 2014-06-27 LAB — WET PREP, GENITAL
CLUE CELLS WET PREP: NONE SEEN
Yeast Wet Prep HPF POC: NONE SEEN

## 2014-06-27 LAB — URINE MICROSCOPIC-ADD ON

## 2014-06-27 LAB — POCT PREGNANCY, URINE: PREG TEST UR: NEGATIVE

## 2014-06-27 MED ORDER — METRONIDAZOLE 500 MG PO TABS
2000.0000 mg | ORAL_TABLET | Freq: Once | ORAL | Status: AC
  Administered 2014-06-27: 2000 mg via ORAL
  Filled 2014-06-27: qty 4

## 2014-06-27 MED ORDER — KETOROLAC TROMETHAMINE 60 MG/2ML IM SOLN
60.0000 mg | Freq: Once | INTRAMUSCULAR | Status: AC
  Administered 2014-06-27: 60 mg via INTRAMUSCULAR
  Filled 2014-06-27: qty 2

## 2014-06-27 MED ORDER — IBUPROFEN 800 MG PO TABS: 800.0000 mg | ORAL_TABLET | Freq: Three times a day (TID) | ORAL | Status: DC | PRN

## 2014-06-27 NOTE — MAU Note (Signed)
Missed period, couple wks.  Been nauseous, breasts have been tends, abd seems better. Has not done home test.  Started bleeding really heavy yesterday afternoon. Pain in lower abd/pelvis down into legs.

## 2014-06-27 NOTE — MAU Provider Note (Signed)
Chief Complaint: Possible Pregnancy; Abdominal Pain; and Vaginal Bleeding   First Provider Initiated Contact with Patient 06/27/14 1421      SUBJECTIVE HPI: Regina Villegas is a 29 y.o. G18P3003 female who presents to Maternity Admissions reporting being two weeks late for her period and then having heavy bleeding since yesterday (06/28/14). Much less over the past few hours. Also reports low abd cramping radiating down bilat thighs. Hasn't taking UPT.  Past Medical History  Diagnosis Date  . Depression     no meds  . Anxiety     no meds  . Headache(784.0)   . Anemia     hx  . Vaginal Pap smear, abnormal    OB History  Gravida Para Term Preterm AB SAB TAB Ectopic Multiple Living  3 3 3       3     # Outcome Date GA Lbr Len/2nd Weight Sex Delivery Anes PTL Lv  3 Term 01/28/06 [redacted]w[redacted]d  6 lb (2.722 kg) F CS-LTranv EPI  Y  2 Term 11/03/03 [redacted]w[redacted]d  7 lb 6 oz (3.345 kg) Berenice Bouton EPI  Y  1 Term 05/04/01 [redacted]w[redacted]d  9 lb (4.082 kg) M CS-LTranv EPI  Y     Past Surgical History  Procedure Laterality Date  . Cesarean section      x 3  . Iud removal      Mirena  . Wisdom tooth extraction    . Gynecologic cryosurgery    . Vulvar lesion removal Left 10/14/2013    Procedure: VULVAR LESION;  Surgeon: Lahoma Crocker, MD;  Location: [facility];  Service: Gynecology;  Laterality: Left;   History   Social History  . Marital Status: Single    Spouse Name: N/A  . Number of Children: N/A  . Years of Education: N/A   Occupational History  . Not on file.   Social History Main Topics  . Smoking status: Current Some Day Smoker -- 0.25 packs/day for 7 years    Types: Cigarettes    Last Attempt to Quit: 09/24/2013  . Smokeless tobacco: Never Used  . Alcohol Use: Yes     Comment: on weekends  . Drug Use: Yes    Special: Marijuana     Comment: Socially - last use 1-2 months ago  . Sexual Activity: Yes    Birth Control/ Protection: None   Other Topics Concern  . Not on file   Social History  Narrative   No current facility-administered medications on file prior to encounter.   No current outpatient prescriptions on file prior to encounter.   No Known Allergies  Review of Systems  Constitutional: Negative for fever and chills.  Gastrointestinal: Positive for abdominal pain. Negative for nausea, vomiting, diarrhea and constipation.  Genitourinary: Negative for dysuria, urgency, frequency, hematuria and flank pain.       Pos for vaginal bleeding. Neg for vaginal discharge.  Musculoskeletal: Negative for back pain.  Neurological: Negative for dizziness and weakness.    OBJECTIVE Blood pressure 121/73, pulse 85, temperature 98.1 F (36.7 C), temperature source Oral, resp. rate 18, height 5' (1.524 m), weight 130 lb (58.968 kg), last menstrual period 05/19/2014. GENERAL: Well-developed, well-nourished female in no acute distress. No Pallor.  HEART: normal rate RESP: normal effort GI: Abdomen soft, non-tender. No mass. Positive bowel sounds 4. MS: Nontender, no edema NEURO: Alert and oriented SPECULUM EXAM: NEFG, small-moderate amount of bright red blood noted, No clots or tissue. cervix clean. No polyps. No mucopurulent discharge.  BIMANUAL: cervix  closed; uterus normal size, no adnexal tenderness or masses. No CMT.  LAB RESULTS Results for orders placed or performed during the hospital encounter of 06/27/14 (from the past 24 hour(s))  Urinalysis, Routine w reflex microscopic     Status: Abnormal   Collection Time: 06/27/14  1:35 PM  Result Value Ref Range   Color, Urine YELLOW YELLOW   APPearance CLEAR CLEAR   Specific Gravity, Urine >1.030 (H) 1.005 - 1.030   pH 5.0 5.0 - 8.0   Glucose, UA NEGATIVE NEGATIVE mg/dL   Hgb urine dipstick LARGE (A) NEGATIVE   Bilirubin Urine NEGATIVE NEGATIVE   Ketones, ur NEGATIVE NEGATIVE mg/dL   Protein, ur NEGATIVE NEGATIVE mg/dL   Urobilinogen, UA 0.2 0.0 - 1.0 mg/dL   Nitrite NEGATIVE NEGATIVE   Leukocytes, UA NEGATIVE NEGATIVE   Urine microscopic-add on     Status: Abnormal   Collection Time: 06/27/14  1:35 PM  Result Value Ref Range   Squamous Epithelial / LPF FEW (A) RARE   RBC / HPF TOO NUMEROUS TO COUNT <3 RBC/hpf   Bacteria, UA RARE RARE   Urine-Other TRICHOMONAS PRESENT   Pregnancy, urine POC     Status: None   Collection Time: 06/27/14  1:41 PM  Result Value Ref Range   Preg Test, Ur NEGATIVE NEGATIVE  CBC     Status: None   Collection Time: 06/27/14  2:20 PM  Result Value Ref Range   WBC 6.4 4.0 - 10.5 K/uL   RBC 4.00 3.87 - 5.11 MIL/uL   Hemoglobin 13.1 12.0 - 15.0 g/dL   HCT 36.6 36.0 - 46.0 %   MCV 91.5 78.0 - 100.0 fL   MCH 32.8 26.0 - 34.0 pg   MCHC 35.8 30.0 - 36.0 g/dL   RDW 13.8 11.5 - 15.5 %   Platelets 219 150 - 400 K/uL  Wet prep, genital     Status: Abnormal   Collection Time: 06/27/14  3:05 PM  Result Value Ref Range   Yeast Wet Prep HPF POC NONE SEEN NONE SEEN   Trich, Wet Prep FEW (A) NONE SEEN   Clue Cells Wet Prep HPF POC NONE SEEN NONE SEEN   WBC, Wet Prep HPF POC FEW (A) NONE SEEN    IMAGING No results found.  MAU COURSE CBC, UA, Wet prep, GC/Chamydia, HIV.  Notified for Trichomonas. Flagyl given.   ASSESSMENT 1. Menorrhagia with irregular cycle   2. Dysmenorrhea   3. Trichomonal vaginitis     PLAN Discharge home in stable condition. Partner needs Tx.. No sex x 1 week. Use condoms.  GC/Chlamydia, HIV pending.      Follow-up Information    Follow up with [facility].   Specialty:  Obstetrics and Gynecology   Why:  As needed if symptoms worsen   Contact information:   [address], [place] [phone]      Follow up with [facility].   Why:  As needed in emergencies   Contact information:   [address] [id number] [place] [phone]       Medication List    TAKE these medications        ibuprofen 800 MG  tablet  Commonly known as:  ADVIL,MOTRIN  Take 1 tablet (800 mg total) by mouth every 8 (eight) hours as needed for cramping.       [place], [place] 06/27/2014  3:50 PM

## 2014-06-27 NOTE — Discharge Instructions (Signed)
Trichomoniasis Trichomoniasis is an infection caused by an organism called Trichomonas. The infection can affect both women and men. In women, the outer female genitalia and the vagina are affected. In men, the penis is mainly affected, but the prostate and other reproductive organs can also be involved. Trichomoniasis is a sexually transmitted infection (STI) and is most often passed to another person through sexual contact.  RISK FACTORS  Having unprotected sexual intercourse.  Having sexual intercourse with an infected partner. SIGNS AND SYMPTOMS  Symptoms of trichomoniasis in women include:  Abnormal gray-green frothy vaginal discharge.  Itching and irritation of the vagina.  Itching and irritation of the area outside the vagina. Symptoms of trichomoniasis in men include:   Penile discharge with or without pain.  Pain during urination. This results from inflammation of the urethra. DIAGNOSIS  Trichomoniasis may be found during a Pap test or physical exam. Your health care provider may use one of the following methods to help diagnose this infection:  Examining vaginal discharge under a microscope. For men, urethral discharge would be examined.  Testing the pH of the vagina with a test tape.  Using a vaginal swab test that checks for the Trichomonas organism. A test is available that provides results within a few minutes.  Doing a culture test for the organism. This is not usually needed. TREATMENT   You may be given medicine to fight the infection. Women should inform their health care provider if they could be or are pregnant. Some medicines used to treat the infection should not be taken during pregnancy.  Your health care provider may recommend over-the-counter medicines or creams to decrease itching or irritation.  Your sexual partner will need to be treated if infected. HOME CARE INSTRUCTIONS   Take medicines only as directed by your health care provider.  Take  over-the-counter medicine for itching or irritation as directed by your health care provider.  Do not have sexual intercourse while you have the infection.  Women should not douche or wear tampons while they have the infection.  Discuss your infection with your partner. Your partner may have gotten the infection from you, or you may have gotten it from your partner.  Have your sex partner get examined and treated if necessary.  Practice safe, informed, and protected sex.  See your health care provider for other STI testing. SEEK MEDICAL CARE IF:   You still have symptoms after you finish your medicine.  You develop abdominal pain.  You have pain when you urinate.  You have bleeding after sexual intercourse.  You develop a rash.  Your medicine makes you sick or makes you throw up (vomit). MAKE SURE YOU:  Understand these instructions.  Will watch your condition.  Will get help right away if you are not doing well or get worse. Document Released: 07/30/2000 Document Revised: 06/20/2013 Document Reviewed: 11/15/2012 [facility] Patient Information 2015 [place], [place]. This information is not intended to replace advice given to you by your health care provider. Make sure you discuss any questions you have with your health care provider.   Abnormal Uterine Bleeding Abnormal uterine bleeding can affect women at various stages in life, including teenagers, women in their reproductive years, pregnant women, and women who have reached menopause. Several kinds of uterine bleeding are considered abnormal, including:  Bleeding or spotting between periods.   Bleeding after sexual intercourse.   Bleeding that is heavier or more than normal.   Periods that last longer than usual.  Bleeding after menopause.  Many cases of abnormal uterine bleeding are minor and simple to treat, while others are more serious. Any type of abnormal bleeding should be evaluated by your health care  provider. Treatment will depend on the cause of the bleeding. HOME CARE INSTRUCTIONS Monitor your condition for any changes. The following actions may help to alleviate any discomfort you are experiencing:  Avoid the use of tampons and douches as directed by your health care provider.  Change your pads frequently. You should get regular pelvic exams and Pap tests. Keep all follow-up appointments for diagnostic tests as directed by your health care provider.  SEEK MEDICAL CARE IF:   Your bleeding lasts more than 1 week.   You feel dizzy at times.  SEEK IMMEDIATE MEDICAL CARE IF:   You pass out.   You are changing pads every 15 to 30 minutes.   You have abdominal pain.  You have a fever.   You become sweaty or weak.   You are passing large blood clots from the vagina.   You start to feel nauseous and vomit. MAKE SURE YOU:   Understand these instructions.  Will watch your condition.  Will get help right away if you are not doing well or get worse. Document Released: 02/03/2005 Document Revised: 02/08/2013 Document Reviewed: 09/02/2012 [facility] Patient Information 2015 [place], [place]. This information is not intended to replace advice given to you by your health care provider. Make sure you discuss any questions you have with your health care provider.  Dysmenorrhea Menstrual cramps (dysmenorrhea) are caused by the muscles of the uterus tightening (contracting) during a menstrual period. For some women, this discomfort is merely bothersome. For others, dysmenorrhea can be severe enough to interfere with everyday activities for a few days each month. Primary dysmenorrhea is menstrual cramps that last a couple of days when you start having menstrual periods or soon after. This often begins after a teenager starts having her period. As a woman gets older or has a baby, the cramps will usually lessen or disappear. Secondary dysmenorrhea begins later in life, lasts longer, and  the pain may be stronger than primary dysmenorrhea. The pain may start before the period and last a few days after the period.  CAUSES  Dysmenorrhea is usually caused by an underlying problem, such as:  The tissue lining the uterus grows outside of the uterus in other areas of the body (endometriosis).  The endometrial tissue, which normally lines the uterus, is found in or grows into the muscular walls of the uterus (adenomyosis).  The pelvic blood vessels are engorged with blood just before the menstrual period (pelvic congestive syndrome).  Overgrowth of cells (polyps) in the lining of the uterus or cervix.  Falling down of the uterus (prolapse) because of loose or stretched ligaments.  Depression.  Bladder problems, infection, or inflammation.  Problems with the intestine, a tumor, or irritable bowel syndrome.  Cancer of the female organs or bladder.  A severely tipped uterus.  A very tight opening or closed cervix.  Noncancerous tumors of the uterus (fibroids).  Pelvic inflammatory disease (PID).  Pelvic scarring (adhesions) from a previous surgery.  Ovarian cyst.  An intrauterine device (IUD) used for birth control. RISK FACTORS You may be at greater risk of dysmenorrhea if:  You are younger than age 62.  You started puberty early.  You have irregular or heavy bleeding.  You have never given birth.  You have a family history of this problem.  You are a smoker. SIGNS  AND SYMPTOMS   Cramping or throbbing pain in your lower abdomen.  Headaches.  Lower back pain.  Nausea or vomiting.  Diarrhea.  Sweating or dizziness.  Loose stools. DIAGNOSIS  A diagnosis is based on your history, symptoms, physical exam, diagnostic tests, or procedures. Diagnostic tests or procedures may include:  Blood tests.  Ultrasonography.  An examination of the lining of the uterus (dilation and curettage, D&C).  An examination inside your abdomen or pelvis with a  scope (laparoscopy).  X-rays.  CT scan.  MRI.  An examination inside the bladder with a scope (cystoscopy).  An examination inside the intestine or stomach with a scope (colonoscopy, gastroscopy). TREATMENT  Treatment depends on the cause of the dysmenorrhea. Treatment may include:  Pain medicine prescribed by your health care provider.  Birth control pills or an IUD with progesterone hormone in it.  Hormone replacement therapy.  Nonsteroidal anti-inflammatory drugs (NSAIDs). These may help stop the production of prostaglandins.  Surgery to remove adhesions, endometriosis, ovarian cyst, or fibroids.  Removal of the uterus (hysterectomy).  Progesterone shots to stop the menstrual period.  Cutting the nerves on the sacrum that go to the female organs (presacral neurectomy).  Electric current to the sacral nerves (sacral nerve stimulation).  Antidepressant medicine.  Psychiatric therapy, counseling, or group therapy.  Exercise and physical therapy.  Meditation and yoga therapy.  Acupuncture. HOME CARE INSTRUCTIONS   Only take over-the-counter or prescription medicines as directed by your health care provider.  Place a heating pad or hot water bottle on your lower back or abdomen. Do not sleep with the heating pad.  Use aerobic exercises, walking, swimming, biking, and other exercises to help lessen the cramping.  Massage to the lower back or abdomen may help.  Stop smoking.  Avoid alcohol and caffeine. SEEK MEDICAL CARE IF:   Your pain does not get better with medicine.  You have pain with sexual intercourse.  Your pain increases and is not controlled with medicines.  You have abnormal vaginal bleeding with your period.  You develop nausea or vomiting with your period that is not controlled with medicine. SEEK IMMEDIATE MEDICAL CARE IF:  You pass out.  Document Released: 02/03/2005 Document Revised: 10/06/2012 Document Reviewed: 07/22/2012 [facility]  Patient Information 2015 [place], [place]. This information is not intended to replace advice given to you by your health care provider. Make sure you discuss any questions you have with your health care provider.

## 2014-06-28 LAB — GC/CHLAMYDIA PROBE AMP (~~LOC~~) NOT AT ARMC
CHLAMYDIA, DNA PROBE: NEGATIVE
Neisseria Gonorrhea: NEGATIVE

## 2014-06-30 ENCOUNTER — Other Ambulatory Visit: Payer: Self-pay | Admitting: Obstetrics

## 2014-06-30 DIAGNOSIS — A5903 Trichomonal cystitis and urethritis: Secondary | ICD-10-CM

## 2014-06-30 MED ORDER — TINIDAZOLE 500 MG PO TABS: 2.0000 g | ORAL_TABLET | Freq: Every day | ORAL | Status: DC

## 2014-12-25 ENCOUNTER — Emergency Department (HOSPITAL_COMMUNITY)
Admission: EM | Admit: 2014-12-25 | Discharge: 2014-12-25 | Disposition: A | Discharge status: Home / Self Care | Payer: 59 | Attending: Emergency Medicine | Admitting: Emergency Medicine

## 2014-12-25 ENCOUNTER — Emergency Department (HOSPITAL_COMMUNITY): Payer: 59

## 2014-12-25 ENCOUNTER — Encounter (HOSPITAL_COMMUNITY): Payer: Self-pay | Admitting: Emergency Medicine

## 2014-12-25 DIAGNOSIS — Z8659 Personal history of other mental and behavioral disorders: Secondary | ICD-10-CM | POA: Insufficient documentation

## 2014-12-25 DIAGNOSIS — Y9389 Activity, other specified: Secondary | ICD-10-CM | POA: Insufficient documentation

## 2014-12-25 DIAGNOSIS — F17210 Nicotine dependence, cigarettes, uncomplicated: Secondary | ICD-10-CM | POA: Insufficient documentation

## 2014-12-25 DIAGNOSIS — Z792 Long term (current) use of antibiotics: Secondary | ICD-10-CM | POA: Insufficient documentation

## 2014-12-25 DIAGNOSIS — S5011XA Contusion of right forearm, initial encounter: Secondary | ICD-10-CM | POA: Insufficient documentation

## 2014-12-25 DIAGNOSIS — T07XXXA Unspecified multiple injuries, initial encounter: Secondary | ICD-10-CM

## 2014-12-25 DIAGNOSIS — S29001A Unspecified injury of muscle and tendon of front wall of thorax, initial encounter: Secondary | ICD-10-CM | POA: Insufficient documentation

## 2014-12-25 DIAGNOSIS — Z862 Personal history of diseases of the blood and blood-forming organs and certain disorders involving the immune mechanism: Secondary | ICD-10-CM | POA: Insufficient documentation

## 2014-12-25 DIAGNOSIS — Y998 Other external cause status: Secondary | ICD-10-CM | POA: Insufficient documentation

## 2014-12-25 DIAGNOSIS — R0789 Other chest pain: Secondary | ICD-10-CM

## 2014-12-25 DIAGNOSIS — Y9289 Other specified places as the place of occurrence of the external cause: Secondary | ICD-10-CM | POA: Insufficient documentation
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
## 2014-12-25 MED ORDER — IBUPROFEN 800 MG PO TABS: 800.0000 mg | ORAL_TABLET | Freq: Three times a day (TID) | ORAL | Status: DC | PRN

## 2014-12-25 MED ORDER — HYDROCODONE-ACETAMINOPHEN 5-325 MG PO TABS
2.0000 | ORAL_TABLET | Freq: Once | ORAL | Status: AC
  Administered 2014-12-25: 2 via ORAL
  Filled 2014-12-25: qty 2

## 2014-12-25 MED ORDER — HYDROCODONE-ACETAMINOPHEN 5-325 MG PO TABS: 1.0000 | ORAL_TABLET | Freq: Four times a day (QID) | ORAL | Status: DC | PRN

## 2014-12-25 MED ORDER — IBUPROFEN 800 MG PO TABS
800.0000 mg | ORAL_TABLET | Freq: Once | ORAL | Status: AC
  Administered 2014-12-25: 800 mg via ORAL
  Filled 2014-12-25: qty 1

## 2014-12-25 NOTE — Discharge Instructions (Signed)
Chest Wall Pain Chest wall pain is pain in or around the bones and muscles of your chest. Sometimes, an injury causes this pain. Sometimes, the cause may not be known. This pain may take several weeks or longer to get better. HOME CARE INSTRUCTIONS  Pay attention to any changes in your symptoms. Take these actions to help with your pain:   Rest as told by your health care provider.   Avoid activities that cause pain. These include any activities that use your chest muscles or your abdominal and side muscles to lift heavy items.   If directed, apply ice to the painful area:  Put ice in a plastic bag.  Place a towel between your skin and the bag.  Leave the ice on for 20 minutes, 2-3 times per day.  Take over-the-counter and prescription medicines only as told by your health care provider.  Do not use tobacco products, including cigarettes, chewing tobacco, and e-cigarettes. If you need help quitting, ask your health care provider.  Keep all follow-up visits as told by your health care provider. This is important. SEEK MEDICAL CARE IF:  You have a fever.  Your chest pain becomes worse.  You have new symptoms. SEEK IMMEDIATE MEDICAL CARE IF:  You have nausea or vomiting.  You feel sweaty or light-headed.  You have a cough with phlegm (sputum) or you cough up blood.  You develop shortness of breath.   This information is not intended to replace advice given to you by your health care provider. Make sure you discuss any questions you have with your health care provider.   Document Released: 02/03/2005 Document Revised: 10/25/2014 Document Reviewed: 05/01/2014 Elsevier Interactive Patient Education 2016 [place] A contusion is a deep bruise. Contusions are the result of a blunt injury to tissues and muscle fibers under the skin. The injury causes bleeding under the skin. The skin overlying the contusion may turn blue, purple, or yellow. Minor injuries will  give you a painless contusion, but more severe contusions may stay painful and swollen for a few weeks.  CAUSES  This condition is usually caused by a blow, trauma, or direct force to an area of the body. SYMPTOMS  Symptoms of this condition include:  Swelling of the injured area.  Pain and tenderness in the injured area.  Discoloration. The area may have redness and then turn blue, purple, or yellow. DIAGNOSIS  This condition is diagnosed based on a physical exam and medical history. An X-ray, CT scan, or MRI may be needed to determine if there are any associated injuries, such as broken bones (fractures). TREATMENT  Specific treatment for this condition depends on what area of the body was injured. In general, the best treatment for a contusion is resting, icing, applying pressure to (compression), and elevating the injured area. This is often called the RICE strategy. Over-the-counter anti-inflammatory medicines may also be recommended for pain control.  HOME CARE INSTRUCTIONS   Rest the injured area.  If directed, apply ice to the injured area:  Put ice in a plastic bag.  Place a towel between your skin and the bag.  Leave the ice on for 20 minutes, 2-3 times per day.  If directed, apply light compression to the injured area using an elastic bandage. Make sure the bandage is not wrapped too tightly. Remove and reapply the bandage as directed by your health care provider.  If possible, raise (elevate) the injured area above the level of your heart while you  are sitting or lying down.  Take over-the-counter and prescription medicines only as told by your health care provider. SEEK MEDICAL CARE IF:  Your symptoms do not improve after several days of treatment.  Your symptoms get worse.  You have difficulty moving the injured area. SEEK IMMEDIATE MEDICAL CARE IF:   You have severe pain.  You have numbness in a hand or foot.  Your hand or foot turns pale or cold.   This  information is not intended to replace advice given to you by your health care provider. Make sure you discuss any questions you have with your health care provider.   Document Released: 11/13/2004 Document Revised: 10/25/2014 Document Reviewed: 06/21/2014 Elsevier Interactive Patient Education 2016 [place] for Routine Care of Injuries Theroutine careofmanyinjuriesincludes rest, ice, compression, and elevation (RICE therapy). RICE therapy is often recommended for injuries to soft tissues, such as a muscle strain, ligament injuries, bruises, and overuse injuries. It can also be used for some bony injuries. Using RICE therapy can help to relieve pain, lessen swelling, and enable your body to heal. Rest Rest is required to allow your body to heal. This usually involves reducing your normal activities and avoiding use of the injured part of your body. Generally, you can return to your normal activities when you are comfortable and have been given permission by your health care provider. Ice Icing your injury helps to keep the swelling down, and it lessens pain. Do not apply ice directly to your skin.  Put ice in a plastic bag.  Place a towel between your skin and the bag.  Leave the ice on for 20 minutes, 2-3 times a day. Do this for as long as you are directed by your health care provider. Compression Compression means putting pressure on the injured area. Compression helps to keep swelling down, gives support, and helps with discomfort. Compression may be done with an elastic bandage. If an elastic bandage has been applied, follow these general tips:  Remove and reapply the bandage every 3-4 hours or as directed by your health care provider.  Make sure the bandage is not wrapped too tightly, because this can cut off circulation. If part of your body beyond the bandage becomes blue, numb, cold, swollen, or more painful, your bandage is most likely too tight. If this occurs,  remove your bandage and reapply it more loosely.  See your health care provider if the bandage seems to be making your problems worse rather than better. Elevation Elevation means keeping the injured area raised. This helps to lessen swelling and decrease pain. If possible, your injured area should be elevated at or above the level of your heart or the center of your chest. [place]? You should seek medical care if:  Your pain and swelling continue.  Your symptoms are getting worse rather than improving. These symptoms may indicate that further evaluation or further X-rays are needed. Sometimes, X-rays may not show a small broken bone (fracture) until a number of days later. Make a follow-up appointment with your health care provider. WHEN SHOULD I SEEK IMMEDIATE MEDICAL CARE? You should seek immediate medical care if:  You have sudden severe pain at or below the area of your injury.  You have redness or increased swelling around your injury.  You have tingling or numbness at or below the area of your injury that does not improve after you remove the elastic bandage.   This information is not  intended to replace advice given to you by your health care provider. Make sure you discuss any questions you have with your health care provider.   Document Released: 05/18/2000 Document Revised: 10/25/2014 Document Reviewed: 01/11/2014 Elsevier Interactive Patient Education [employer].
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
## 2015-02-18 HISTORY — PX: FACIAL RECONSTRUCTION SURGERY: SHX631

## 2015-03-03 ENCOUNTER — Emergency Department (HOSPITAL_COMMUNITY)
Admission: EM | Admit: 2015-03-03 | Discharge: 2015-03-03 | Disposition: A | Discharge status: Home / Self Care | Payer: Self-pay | Attending: Emergency Medicine | Admitting: Emergency Medicine

## 2015-03-03 ENCOUNTER — Encounter (HOSPITAL_COMMUNITY): Payer: Self-pay | Admitting: *Deleted

## 2015-03-03 DIAGNOSIS — F17210 Nicotine dependence, cigarettes, uncomplicated: Secondary | ICD-10-CM | POA: Insufficient documentation

## 2015-03-03 DIAGNOSIS — R531 Weakness: Secondary | ICD-10-CM | POA: Insufficient documentation

## 2015-03-03 DIAGNOSIS — Z8659 Personal history of other mental and behavioral disorders: Secondary | ICD-10-CM | POA: Insufficient documentation

## 2015-03-03 DIAGNOSIS — Z862 Personal history of diseases of the blood and blood-forming organs and certain disorders involving the immune mechanism: Secondary | ICD-10-CM | POA: Insufficient documentation

## 2015-03-03 DIAGNOSIS — N12 Tubulo-interstitial nephritis, not specified as acute or chronic: Secondary | ICD-10-CM | POA: Insufficient documentation

## 2015-03-03 LAB — URINALYSIS, ROUTINE W REFLEX MICROSCOPIC
Bilirubin Urine: NEGATIVE
Glucose, UA: NEGATIVE mg/dL
Ketones, ur: NEGATIVE mg/dL
NITRITE: POSITIVE — AB
Protein, ur: 30 mg/dL — AB
SPECIFIC GRAVITY, URINE: 1.023 (ref 1.005–1.030)
pH: 5 (ref 5.0–8.0)

## 2015-03-03 LAB — URINE MICROSCOPIC-ADD ON

## 2015-03-03 MED ORDER — CIPROFLOXACIN HCL 500 MG PO TABS: 500.0000 mg | ORAL_TABLET | Freq: Two times a day (BID) | ORAL | Status: DC

## 2015-03-03 MED ORDER — ONDANSETRON HCL 4 MG PO TABS: 4.0000 mg | ORAL_TABLET | Freq: Four times a day (QID) | ORAL | Status: DC

## 2015-03-03 MED ORDER — DEXTROSE 5 % IV SOLN
1.0000 g | Freq: Once | INTRAVENOUS | Status: AC
  Administered 2015-03-03: 1 g via INTRAVENOUS
  Filled 2015-03-03: qty 10

## 2015-03-03 MED ORDER — HYDROCODONE-ACETAMINOPHEN 5-325 MG PO TABS
1.0000 | ORAL_TABLET | Freq: Once | ORAL | Status: AC
  Administered 2015-03-03: 1 via ORAL
  Filled 2015-03-03: qty 1

## 2015-03-03 NOTE — Discharge Instructions (Signed)

## 2015-03-03 NOTE — ED Notes (Signed)
Pt in c/o dysuria & L flank pain onset x 2-3 days with bladder pressure, pt denies hematuria, pt c/o urinary frequency, pt reports tx for severe UTI in Nov, reports completing abx at that time, pt c/o generalized weakness, A&O x4

## 2015-03-03 NOTE — ED Provider Notes (Signed)
CSN: [id number]     Arrival date & time 03/03/15  1202 History   First MD Initiated Contact with Patient 03/03/15 1222     Chief Complaint  Patient presents with  . Dysuria   HPI  Regina Villegas is a 30 year old female with PMHx of anemia, anxiety and depression presenting with dysuria, increased frequency and left-sided flank pain. She reports a "severe" UTI one month ago. She was treated with Macrobid at an outside hospital. She states this initially improved her symptoms but she noticed suprapubic pressure and dysuria at the end of urinating approximately 2 weeks ago. She did not seek treatment at that time. She has not taken medications for her UTI symptoms. She states that the suprapubic pain has gotten more severe and is associated with nausea now. She states that she has severe dysuria and increased frequency over the past week. She notes that 2 days ago she had gradual onset of left-sided, stabbing flank pain. She states the pain is so severe that she has to walk hunched over. She endorses intermittent chills but no documented fevers at home and generalized fatigue and weakness. She has tried no treatments PTA. Denies headache, dizziness, syncope, vision disturbances, neck pain, chest pain, SOB, cough, vomiting, diarrhea, hematuria, pelvic pain and vaginal discharge.   Past Medical History  Diagnosis Date  . Depression     no meds  . Anxiety     no meds  . Headache(784.0)   . Anemia     hx  . Vaginal Pap smear, abnormal    Past Surgical History  Procedure Laterality Date  . Cesarean section      x 3  . Iud removal      Mirena  . Wisdom tooth extraction    . Gynecologic cryosurgery    . Vulvar lesion removal Left 10/14/2013    Procedure: VULVAR LESION;  Surgeon: Lahoma Crocker, MD;  Location: [facility];  Service: Gynecology;  Laterality: Left;   No family history on file. Social History  Substance Use Topics  . Smoking status: Current Some Day Smoker -- 0.25 packs/day for 7 years     Types: Cigarettes  . Smokeless tobacco: Never Used  . Alcohol Use: Yes     Comment: on weekends   OB History    Gravida Para Term Preterm AB TAB SAB Ectopic Multiple Living   3 3 3       3      Review of Systems  Constitutional: Positive for chills and fatigue. Negative for fever.  HENT: Negative.   Gastrointestinal: Positive for nausea and abdominal pain. Negative for vomiting and diarrhea.  Genitourinary: Positive for dysuria, frequency and flank pain. Negative for hematuria and vaginal discharge.  Neurological: Positive for weakness. Negative for dizziness, syncope and headaches.  All other systems reviewed and are negative.     Allergies  Tramadol  Home Medications   Prior to Admission medications   Medication Sig Start Date End Date Taking? Authorizing Provider  ibuprofen (ADVIL,MOTRIN) 800 MG tablet Take 1 tablet (800 mg total) by mouth every 8 (eight) hours as needed for mild pain. 12/25/14  Yes Kristen N Ward, DO  ciprofloxacin (CIPRO) 500 MG tablet Take 1 tablet (500 mg total) by mouth every 12 (twelve) hours. 03/03/15   Merilynn Haydu, PA-C  ondansetron (ZOFRAN) 4 MG tablet Take 1 tablet (4 mg total) by mouth every 6 (six) hours. 03/03/15   Demetri Goshert, PA-C   BP 129/78 mmHg  Pulse 84  Temp(Src) 98.1 F (36.7 C) (Oral)  Resp 14  Ht 5\' 3"  (1.6 m)  Wt 53.071 kg  BMI 20.73 kg/m2  SpO2 100%  LMP 03/01/2015 Physical Exam  Constitutional: She appears well-developed and well-nourished. No distress.  Nontoxic appearing  HENT:  Head: Normocephalic and atraumatic.  Eyes: Conjunctivae are normal. Right eye exhibits no discharge. Left eye exhibits no discharge. No scleral icterus.  Neck: Normal range of motion.  Cardiovascular: Normal rate and regular rhythm.   Pulmonary/Chest: Effort normal. No respiratory distress.  Abdominal: There is tenderness in the suprapubic area. There is CVA tenderness. There is no rigidity, no rebound and no guarding.  Suprapubic  tenderness to palpation without peritoneal signs. Left-sided CVA tenderness.  Musculoskeletal: Normal range of motion.  Walks with a steady gait  Neurological: She is alert. Coordination normal.  Skin: Skin is warm and dry.  Psychiatric: She has a normal mood and affect. Her behavior is normal.  Nursing note and vitals reviewed.   ED Course  Procedures (including critical care time) Labs Review Labs Reviewed  URINALYSIS, ROUTINE W REFLEX MICROSCOPIC (NOT AT [facility]) - Abnormal; Notable for the following:    APPearance TURBID (*)    Hgb urine dipstick LARGE (*)    Protein, ur 30 (*)    Nitrite POSITIVE (*)    Leukocytes, UA SMALL (*)    All other components within normal limits  URINE MICROSCOPIC-ADD ON - Abnormal; Notable for the following:    Squamous Epithelial / LPF 6-30 (*)    Bacteria, UA MANY (*)    All other components within normal limits    Imaging Review No results found. I have personally reviewed and evaluated these images and lab results as part of my medical decision-making.   EKG Interpretation None      MDM   Final diagnoses:  Pyelonephritis   Pt presenting with dysuria, suprapubic discomfort and left flank pain. Associated with chills, nausea, fatigue. VSS. Abdomen is soft with suprapubic tenderness. Left sided CVA tenderness present. UA positive for leukocytes, nitrites and bacteria. Pt has been diagnosed with a pyelonephritis. 1 g ceftriaxone given in ED. Will discharge with cipro and instructions to follow up with PCP. Return precautions given in discharge paperwork and discussed with pt at bedside. Pt stable for discharge      Josephina Gip, PA-C 03/03/15 1448  Virgel Manifold, MD 03/14/15 1318

## 2015-03-19 DIAGNOSIS — S0232XA Fracture of orbital floor, left side, initial encounter for closed fracture: Secondary | ICD-10-CM

## 2015-03-19 HISTORY — DX: Fracture of orbital floor, left side, initial encounter for closed fracture: S02.32XA

## 2015-03-27 ENCOUNTER — Emergency Department (HOSPITAL_COMMUNITY): Payer: Medicaid Other

## 2015-03-27 ENCOUNTER — Emergency Department (HOSPITAL_COMMUNITY)
Admission: EM | Admit: 2015-03-27 | Discharge: 2015-03-27 | Disposition: A | Discharge status: Home / Self Care | Payer: Medicaid Other | Attending: Emergency Medicine | Admitting: Emergency Medicine

## 2015-03-27 ENCOUNTER — Encounter (HOSPITAL_COMMUNITY): Payer: Self-pay

## 2015-03-27 DIAGNOSIS — S02401S Maxillary fracture, unspecified, sequela: Secondary | ICD-10-CM

## 2015-03-27 DIAGNOSIS — S0240DS Maxillary fracture, left side, sequela: Secondary | ICD-10-CM | POA: Diagnosis not present

## 2015-03-27 DIAGNOSIS — R200 Anesthesia of skin: Secondary | ICD-10-CM | POA: Diagnosis present

## 2015-03-27 DIAGNOSIS — F17210 Nicotine dependence, cigarettes, uncomplicated: Secondary | ICD-10-CM | POA: Insufficient documentation

## 2015-03-27 DIAGNOSIS — R202 Paresthesia of skin: Secondary | ICD-10-CM | POA: Insufficient documentation

## 2015-03-27 DIAGNOSIS — Z862 Personal history of diseases of the blood and blood-forming organs and certain disorders involving the immune mechanism: Secondary | ICD-10-CM | POA: Diagnosis not present

## 2015-03-27 DIAGNOSIS — Z8659 Personal history of other mental and behavioral disorders: Secondary | ICD-10-CM | POA: Diagnosis not present

## 2015-03-27 DIAGNOSIS — H1132 Conjunctival hemorrhage, left eye: Secondary | ICD-10-CM | POA: Diagnosis not present
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
## 2015-03-27 MED ORDER — OXYCODONE-ACETAMINOPHEN 5-325 MG PO TABS
2.0000 | ORAL_TABLET | Freq: Once | ORAL | Status: AC
  Administered 2015-03-27: 2 via ORAL
  Filled 2015-03-27: qty 2

## 2015-03-27 NOTE — Discharge Instructions (Signed)
You were evaluated in the ED today for your left cheek numbness. Your found to have a fracture of your left cheek and eye socket bones. It is important for you to follow-up with Dr. Marla Roe with maxillofacial surgery. Please call her office tomorrow for reevaluation. Return to ED for any new or worsening symptoms as we discussed. Do not blow your nose.
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
## 2015-03-27 NOTE — ED Notes (Signed)
Patient transported to CT 

## 2015-03-27 NOTE — ED Notes (Signed)
Dr. Marla Roe paged to [phone] @ 2242.
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
## 2015-03-30 ENCOUNTER — Encounter (HOSPITAL_BASED_OUTPATIENT_CLINIC_OR_DEPARTMENT_OTHER): Payer: Self-pay | Admitting: *Deleted

## 2015-04-01 ENCOUNTER — Encounter (HOSPITAL_COMMUNITY): Payer: Self-pay | Admitting: Emergency Medicine

## 2015-04-01 ENCOUNTER — Emergency Department (HOSPITAL_COMMUNITY)
Admission: EM | Admit: 2015-04-01 | Discharge: 2015-04-01 | Disposition: A | Discharge status: Home / Self Care | Payer: Medicaid Other | Attending: Emergency Medicine | Admitting: Emergency Medicine

## 2015-04-01 DIAGNOSIS — Z8781 Personal history of (healed) traumatic fracture: Secondary | ICD-10-CM | POA: Insufficient documentation

## 2015-04-01 DIAGNOSIS — R51 Headache: Secondary | ICD-10-CM | POA: Diagnosis present

## 2015-04-01 DIAGNOSIS — R519 Headache, unspecified: Secondary | ICD-10-CM

## 2015-04-01 DIAGNOSIS — F17210 Nicotine dependence, cigarettes, uncomplicated: Secondary | ICD-10-CM | POA: Insufficient documentation

## 2015-04-01 DIAGNOSIS — H1132 Conjunctival hemorrhage, left eye: Secondary | ICD-10-CM | POA: Diagnosis not present

## 2015-04-01 DIAGNOSIS — R000 Tachycardia, unspecified: Secondary | ICD-10-CM | POA: Diagnosis not present

## 2015-04-01 MED ORDER — OXYCODONE-ACETAMINOPHEN 5-325 MG PO TABS
1.0000 | ORAL_TABLET | Freq: Once | ORAL | Status: AC
  Administered 2015-04-01: 1 via ORAL
  Filled 2015-04-01: qty 1
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
## 2015-04-01 NOTE — ED Notes (Signed)
Pt from home with c/o unbearable facial pain starting this morning when she woke up.  Pt reports she vomited from the pain this morning.  Pt is to have facial reconstructive surgery in the morning for a orbital fracture.  NAD, A&O, ambulatory.  Laughing in room with family.

## 2015-04-01 NOTE — Discharge Instructions (Signed)
You can take 1000 mg of Tylenol every 4 hours as needed for pain

## 2015-04-01 NOTE — ED Notes (Signed)
Pt not in room.

## 2015-04-02 ENCOUNTER — Ambulatory Visit (HOSPITAL_BASED_OUTPATIENT_CLINIC_OR_DEPARTMENT_OTHER): Payer: Medicaid Other | Admitting: Anesthesiology

## 2015-04-02 ENCOUNTER — Encounter (HOSPITAL_BASED_OUTPATIENT_CLINIC_OR_DEPARTMENT_OTHER)
Admission: RE | Disposition: A | Discharge status: Home / Self Care | Payer: Self-pay | Source: Ambulatory Visit | Attending: Plastic Surgery

## 2015-04-02 ENCOUNTER — Ambulatory Visit (HOSPITAL_BASED_OUTPATIENT_CLINIC_OR_DEPARTMENT_OTHER)
Admission: RE | Admit: 2015-04-02 | Discharge: 2015-04-02 | Disposition: A | Discharge status: Home / Self Care | Payer: Medicaid Other | Source: Ambulatory Visit | Attending: Plastic Surgery | Admitting: Plastic Surgery

## 2015-04-02 ENCOUNTER — Other Ambulatory Visit: Payer: Self-pay | Admitting: Plastic Surgery

## 2015-04-02 ENCOUNTER — Encounter (HOSPITAL_BASED_OUTPATIENT_CLINIC_OR_DEPARTMENT_OTHER): Payer: Self-pay | Admitting: Plastic Surgery

## 2015-04-02 DIAGNOSIS — F17210 Nicotine dependence, cigarettes, uncomplicated: Secondary | ICD-10-CM | POA: Diagnosis not present

## 2015-04-02 DIAGNOSIS — W2210XA Striking against or struck by unspecified automobile airbag, initial encounter: Secondary | ICD-10-CM | POA: Diagnosis not present

## 2015-04-02 DIAGNOSIS — S0230XA Fracture of orbital floor, unspecified side, initial encounter for closed fracture: Secondary | ICD-10-CM

## 2015-04-02 DIAGNOSIS — S0232XA Fracture of orbital floor, left side, initial encounter for closed fracture: Secondary | ICD-10-CM | POA: Diagnosis present

## 2015-04-02 HISTORY — DX: Personal history of other specified conditions: Z87.898

## 2015-04-02 HISTORY — PX: ORIF ORBITAL FRACTURE: SHX5312

## 2015-04-02 HISTORY — DX: Fracture of orbital floor, left side, initial encounter for closed fracture: S02.32XA

## 2015-04-02 SURGERY — OPEN REDUCTION INTERNAL FIXATION (ORIF) ORBITAL FRACTURE
Anesthesia: General | Site: Eye | Laterality: Left

## 2015-04-02 MED ORDER — OXYCODONE HCL 5 MG PO TABS
5.0000 mg | ORAL_TABLET | Freq: Once | ORAL | Status: AC | PRN
  Administered 2015-04-02: 5 mg via ORAL

## 2015-04-02 MED ORDER — HYDROCODONE-ACETAMINOPHEN 5-325 MG PO TABS: 1.0000 | ORAL_TABLET | Freq: Four times a day (QID) | ORAL | Status: DC | PRN

## 2015-04-02 MED ORDER — LIDOCAINE-EPINEPHRINE 1 %-1:100000 IJ SOLN
INTRAMUSCULAR | Status: DC | PRN
  Administered 2015-04-02: 1 mL

## 2015-04-02 MED ORDER — HYDROMORPHONE HCL 1 MG/ML IJ SOLN
INTRAMUSCULAR | Status: AC
  Filled 2015-04-02: qty 1

## 2015-04-02 MED ORDER — MEPERIDINE HCL 25 MG/ML IJ SOLN: 6.2500 mg | INTRAMUSCULAR | Status: DC | PRN

## 2015-04-02 MED ORDER — ONDANSETRON HCL 4 MG/2ML IJ SOLN
INTRAMUSCULAR | Status: AC
  Filled 2015-04-02: qty 2

## 2015-04-02 MED ORDER — CEFAZOLIN SODIUM-DEXTROSE 2-3 GM-% IV SOLR
INTRAVENOUS | Status: DC | PRN
  Administered 2015-04-02: 2 g via INTRAVENOUS

## 2015-04-02 MED ORDER — FENTANYL CITRATE (PF) 100 MCG/2ML IJ SOLN: 50.0000 ug | INTRAMUSCULAR | Status: DC | PRN

## 2015-04-02 MED ORDER — PROPOFOL 10 MG/ML IV BOLUS
INTRAVENOUS | Status: DC | PRN
  Administered 2015-04-02: 340 mg via INTRAVENOUS

## 2015-04-02 MED ORDER — LIDOCAINE HCL (CARDIAC) 20 MG/ML IV SOLN
INTRAVENOUS | Status: DC | PRN
  Administered 2015-04-02: 50 mg via INTRAVENOUS

## 2015-04-02 MED ORDER — DEXAMETHASONE SODIUM PHOSPHATE 4 MG/ML IJ SOLN
INTRAMUSCULAR | Status: DC | PRN
  Administered 2015-04-02: 10 mg via INTRAVENOUS

## 2015-04-02 MED ORDER — MIDAZOLAM HCL 2 MG/2ML IJ SOLN: 1.0000 mg | INTRAMUSCULAR | Status: DC | PRN

## 2015-04-02 MED ORDER — SCOPOLAMINE 1 MG/3DAYS TD PT72: 1.0000 | MEDICATED_PATCH | Freq: Once | TRANSDERMAL | Status: DC | PRN

## 2015-04-02 MED ORDER — NEOMYCIN-POLYMYXIN-DEXAMETH 3.5-10000-0.1 OP OINT
TOPICAL_OINTMENT | OPHTHALMIC | Status: DC | PRN
  Administered 2015-04-02: 1 via OPHTHALMIC

## 2015-04-02 MED ORDER — DEXAMETHASONE SODIUM PHOSPHATE 10 MG/ML IJ SOLN
INTRAMUSCULAR | Status: AC
  Filled 2015-04-02: qty 1

## 2015-04-02 MED ORDER — OXYCODONE HCL 5 MG/5ML PO SOLN: 5.0000 mg | Freq: Once | ORAL | Status: AC | PRN

## 2015-04-02 MED ORDER — CEFAZOLIN SODIUM 1 G IJ SOLR
INTRAMUSCULAR | Status: AC
  Filled 2015-04-02: qty 20

## 2015-04-02 MED ORDER — ONDANSETRON HCL 4 MG/2ML IJ SOLN
INTRAMUSCULAR | Status: DC | PRN
  Administered 2015-04-02: 4 mg via INTRAVENOUS

## 2015-04-02 MED ORDER — PROPOFOL 500 MG/50ML IV EMUL
INTRAVENOUS | Status: AC
  Filled 2015-04-02: qty 50

## 2015-04-02 MED ORDER — NEOMYCIN-POLYMYXIN-DEXAMETH 3.5-10000-0.1 OP OINT
TOPICAL_OINTMENT | OPHTHALMIC | Status: AC
  Filled 2015-04-02: qty 3.5

## 2015-04-02 MED ORDER — MIDAZOLAM HCL 2 MG/2ML IJ SOLN
INTRAMUSCULAR | Status: AC
  Filled 2015-04-02: qty 2

## 2015-04-02 MED ORDER — FENTANYL CITRATE (PF) 100 MCG/2ML IJ SOLN
INTRAMUSCULAR | Status: AC
  Filled 2015-04-02: qty 2

## 2015-04-02 MED ORDER — LIDOCAINE HCL (CARDIAC) 20 MG/ML IV SOLN
INTRAVENOUS | Status: AC
  Filled 2015-04-02: qty 5

## 2015-04-02 MED ORDER — HYDROMORPHONE HCL 1 MG/ML IJ SOLN
0.2500 mg | INTRAMUSCULAR | Status: DC | PRN
  Administered 2015-04-02 (×3): 0.5 mg via INTRAVENOUS

## 2015-04-02 MED ORDER — LIDOCAINE-EPINEPHRINE 1 %-1:100000 IJ SOLN
INTRAMUSCULAR | Status: AC
  Filled 2015-04-02: qty 1

## 2015-04-02 MED ORDER — SUCCINYLCHOLINE CHLORIDE 20 MG/ML IJ SOLN
INTRAMUSCULAR | Status: DC | PRN
  Administered 2015-04-02: 50 mg via INTRAVENOUS

## 2015-04-02 MED ORDER — SUCCINYLCHOLINE CHLORIDE 20 MG/ML IJ SOLN
INTRAMUSCULAR | Status: AC
  Filled 2015-04-02: qty 1

## 2015-04-02 MED ORDER — BSS IO SOLN
INTRAOCULAR | Status: AC
  Filled 2015-04-02: qty 15

## 2015-04-02 MED ORDER — PROPOFOL 10 MG/ML IV BOLUS: INTRAVENOUS | Status: DC | PRN

## 2015-04-02 MED ORDER — BSS IO SOLN
INTRAOCULAR | Status: DC | PRN
  Administered 2015-04-02: 15 mL via INTRAOCULAR

## 2015-04-02 MED ORDER — PROMETHAZINE HCL 25 MG/ML IJ SOLN: 6.2500 mg | INTRAMUSCULAR | Status: DC | PRN

## 2015-04-02 MED ORDER — FENTANYL CITRATE (PF) 100 MCG/2ML IJ SOLN
INTRAMUSCULAR | Status: DC | PRN
Start: 2015-04-02 — End: 2015-04-02
  Administered 2015-04-02: 100 ug via INTRAVENOUS
  Administered 2015-04-02 (×2): 50 ug via INTRAVENOUS

## 2015-04-02 MED ORDER — GLYCOPYRROLATE 0.2 MG/ML IJ SOLN: 0.2000 mg | Freq: Once | INTRAMUSCULAR | Status: DC | PRN

## 2015-04-02 MED ORDER — LACTATED RINGERS IV SOLN
INTRAVENOUS | Status: DC
  Administered 2015-04-02 (×2): via INTRAVENOUS
  Administered 2015-04-02: 10 mL/h via INTRAVENOUS

## 2015-04-02 MED ORDER — OXYCODONE HCL 5 MG PO TABS
ORAL_TABLET | ORAL | Status: AC
  Filled 2015-04-02: qty 1

## 2015-04-02 SURGICAL SUPPLY — 42 items
APPLICATOR DR MATTHEWS STRL (MISCELLANEOUS) ×3 IMPLANT
CANISTER SUCT 1200ML W/VALVE (MISCELLANEOUS) IMPLANT
DECANTER SPIKE VIAL GLASS SM (MISCELLANEOUS) ×3 IMPLANT
DERMABOND ADVANCED (GAUZE/BANDAGES/DRESSINGS)
DERMABOND ADVANCED .7 DNX12 (GAUZE/BANDAGES/DRESSINGS) IMPLANT
ELECT COATED BLADE 2.86 ST (ELECTRODE) ×3 IMPLANT
ELECT NEEDLE BLADE 2-5/6 (NEEDLE) ×3 IMPLANT
ELECT REM PT RETURN 9FT ADLT (ELECTROSURGICAL) ×3
ELECTRODE REM PT RTRN 9FT ADLT (ELECTROSURGICAL) ×1 IMPLANT
GAUZE PACKING FOLDED 2  STR (GAUZE/BANDAGES/DRESSINGS)
GAUZE PACKING FOLDED 2 STR (GAUZE/BANDAGES/DRESSINGS) IMPLANT
GLOVE BIO SURGEON STRL SZ 6.5 (GLOVE) ×4 IMPLANT
GLOVE BIO SURGEONS STRL SZ 6.5 (GLOVE) ×2
GLOVE SURG SS PI 7.0 STRL IVOR (GLOVE) ×3 IMPLANT
GOWN STRL REUS W/ TWL LRG LVL3 (GOWN DISPOSABLE) ×2 IMPLANT
GOWN STRL REUS W/TWL LRG LVL3 (GOWN DISPOSABLE) ×4
NEEDLE HYPO 30GX1 BEV (NEEDLE) ×3 IMPLANT
NEEDLE PRECISIONGLIDE 27X1.5 (NEEDLE) ×3 IMPLANT
NS IRRIG 1000ML POUR BTL (IV SOLUTION) ×3 IMPLANT
PACK BASIN DAY SURGERY FS (CUSTOM PROCEDURE TRAY) ×3 IMPLANT
PACK ENT DAY SURGERY (CUSTOM PROCEDURE TRAY) ×3 IMPLANT
PATTIES SURGICAL .5 X3 (DISPOSABLE) IMPLANT
PENCIL BUTTON HOLSTER BLD 10FT (ELECTRODE) ×3 IMPLANT
PLATE ORBITAL SM FLOOR MESH (Plate) ×3 IMPLANT
SCREW SELF DRILL HT 1.5/4MM (Screw) ×9 IMPLANT
SHEILD EYE MED CORNL SHD 22X21 (OPHTHALMIC RELATED) ×3
SHIELD EYE MED CORNL SHD 22X21 (OPHTHALMIC RELATED) ×1 IMPLANT
SLEEVE SCD COMPRESS KNEE MED (MISCELLANEOUS) ×3 IMPLANT
SUT MNCRL 6-0 UNDY P1 1X18 (SUTURE) ×1 IMPLANT
SUT MON AB 5-0 P3 18 (SUTURE) IMPLANT
SUT MONOCRYL 6-0 P1 1X18 (SUTURE) ×2
SUT PROLENE 6 0 P 1 18 (SUTURE) IMPLANT
SUT SILK 6 0 P 1 (SUTURE) ×3 IMPLANT
SUT VIC AB 4-0 P-3 18XBRD (SUTURE) IMPLANT
SUT VIC AB 4-0 P3 18 (SUTURE)
SUT VIC AB 4-0 SH 27 (SUTURE)
SUT VIC AB 4-0 SH 27XANBCTRL (SUTURE) IMPLANT
SUT VIC AB 5-0 P-3 18X BRD (SUTURE) ×1 IMPLANT
SUT VIC AB 5-0 P3 18 (SUTURE) ×2
SYR BULB 3OZ (MISCELLANEOUS) ×3 IMPLANT
TOWEL OR 17X24 6PK STRL BLUE (TOWEL DISPOSABLE) ×3 IMPLANT
TRAY DSU PREP LF (CUSTOM PROCEDURE TRAY) ×3 IMPLANT

## 2015-04-02 NOTE — Transfer of Care (Signed)
Immediate Anesthesia Transfer of Care Note  Patient: Regina Villegas  Procedure(s) Performed: Procedure(s): OPEN REDUCTION INTERNAL FIXATION (ORIF) LEFT ORBITAL FLOOR FRACTURE (Left)  Patient Location: PACU  Anesthesia Type:General  Level of Consciousness: awake  Airway & Oxygen Therapy: Patient Spontanous Breathing and Patient connected to face mask oxygen  Post-op Assessment: Report given to RN and Post -op Vital signs reviewed and stable  Post vital signs: Reviewed and stable  Last Vitals:  Filed Vitals:   04/02/15 1009 04/02/15 1247  BP: 118/81   Pulse: 74 73  Temp: 37.1 C   Resp: 18 13    Complications: No apparent anesthesia complications

## 2015-04-02 NOTE — Anesthesia Postprocedure Evaluation (Signed)
Anesthesia Post Note  Patient: Regina Villegas  Procedure(s) Performed: Procedure(s) (LRB): OPEN REDUCTION INTERNAL FIXATION (ORIF) LEFT ORBITAL FLOOR FRACTURE (Left)  Patient location during evaluation: PACU Anesthesia Type: General Level of consciousness: awake and alert Pain management: pain level controlled Vital Signs Assessment: post-procedure vital signs reviewed and stable Respiratory status: spontaneous breathing, nonlabored ventilation, respiratory function stable and patient connected to nasal cannula oxygen Cardiovascular status: blood pressure returned to baseline and stable Postop Assessment: no signs of nausea or vomiting Anesthetic complications: no    Last Vitals:  Filed Vitals:   04/02/15 1415 04/02/15 1445  BP: 138/96 132/87  Pulse: 68 58  Temp:  36.7 C  Resp:  16    Last Pain:  Filed Vitals:   04/02/15 1457  PainSc: 4                  Mattia Liford J

## 2015-04-02 NOTE — Discharge Instructions (Addendum)
Keep head elevated No heavy lifting Ice to the area Cold drink for next 2 days    Post Anesthesia Home Care Instructions  Activity: Get plenty of rest for the remainder of the day. A responsible adult should stay with you for 24 hours following the procedure.  For the next 24 hours, DO NOT: -Drive a car -Paediatric nurse -Drink alcoholic beverages -Take any medication unless instructed by your physician -Make any legal decisions or sign important papers.  Meals: Start with liquid foods such as gelatin or soup. Progress to regular foods as tolerated. Avoid greasy, spicy, heavy foods. If nausea and/or vomiting occur, drink only clear liquids until the nausea and/or vomiting subsides. Call your physician if vomiting continues.  Special Instructions/Symptoms: Your throat may feel dry or sore from the anesthesia or the breathing tube placed in your throat during surgery. If this causes discomfort, gargle with warm salt water. The discomfort should disappear within 24 hours.  If you had a scopolamine patch placed behind your ear for the management of post- operative nausea and/or vomiting:  1. The medication in the patch is effective for 72 hours, after which it should be removed.  Wrap patch in a tissue and discard in the trash. Wash hands thoroughly with soap and water. 2. You may remove the patch earlier than 72 hours if you experience unpleasant side effects which may include dry mouth, dizziness or visual disturbances. 3. Avoid touching the patch. Wash your hands with soap and water after contact with the patch.

## 2015-04-02 NOTE — Anesthesia Procedure Notes (Signed)
Procedure Name: Intubation Date/Time: 04/02/2015 11:45 AM Performed by: Lieutenant Diego Pre-anesthesia Checklist: Patient identified, Emergency Drugs available, Suction available and Patient being monitored Patient Re-evaluated:Patient Re-evaluated prior to inductionOxygen Delivery Method: Circle System Utilized Preoxygenation: Pre-oxygenation with 100% oxygen Intubation Type: IV induction Ventilation: Mask ventilation without difficulty Laryngoscope Size: Miller and 3 Grade View: Grade I Tube type: Oral Tube size: 7.0 mm Number of attempts: 1 Airway Equipment and Method: Stylet and Oral airway Placement Confirmation: ETT inserted through vocal cords under direct vision,  positive ETCO2 and breath sounds checked- equal and bilateral Secured at: 21 cm Tube secured with: Tape Dental Injury: Teeth and Oropharynx as per pre-operative assessment
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
## 2015-04-02 NOTE — Anesthesia Preprocedure Evaluation (Signed)
Anesthesia Evaluation  Patient identified by MRN, date of birth, ID band Patient awake    Reviewed: Allergy & Precautions, NPO status , Patient's Chart, lab work & pertinent test results  Airway Mallampati: I  TM Distance: >3 FB Neck ROM: Full    Dental  (+) Teeth Intact, Dental Advisory Given   Pulmonary Current Smoker,  breath sounds clear to auscultation        Cardiovascular Rhythm:Regular Rate:Normal     Neuro/Psych    GI/Hepatic   Endo/Other    Renal/GU      Musculoskeletal   Abdominal   Peds  Hematology   Anesthesia Other Findings   Reproductive/Obstetrics                             Anesthesia Physical Anesthesia Plan  ASA: I  Anesthesia Plan: General   Post-op Pain Management:    Induction: Intravenous  Airway Management Planned: Oral ETT  Additional Equipment:   Intra-op Plan:   Post-operative Plan: Extubation in OR  Informed Consent: I have reviewed the patients History and Physical, chart, labs and discussed the procedure including the risks, benefits and alternatives for the proposed anesthesia with the patient or authorized representative who has indicated his/her understanding and acceptance.   Dental advisory given  Plan Discussed with: CRNA, Anesthesiologist and Surgeon  Anesthesia Plan Comments:         Anesthesia Quick Evaluation  
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
## 2015-04-02 NOTE — Brief Op Note (Signed)
04/02/2015  11:42 AM  PATIENT:  Regina Villegas  30 y.o. female  PRE-OPERATIVE DIAGNOSIS:  orbital floor closed fracture  POST-OPERATIVE DIAGNOSIS:   same  PROCEDURE:  Procedure(s): OPEN REDUCTION INTERNAL FIXATION (ORIF) LEFT ORBITAL FLOOR FRACTURE (Left)  SURGEON:  Surgeon(s) and Role:    * Iesha Summerhill S Ieisha Gao, DO - Primary  ASSISTANTS: none   ANESTHESIA:   general  EBL:     BLOOD ADMINISTERED:none  DRAINS: none   LOCAL MEDICATIONS USED:  LIDOCAINE   SPECIMEN:  No Specimen  DISPOSITION OF SPECIMEN:  N/A  COUNTS:  YES  TOURNIQUET:  * No tourniquets in log *  DICTATION: .Dragon Dictation  PLAN OF CARE: Discharge to home after PACU  PATIENT DISPOSITION:  PACU - hemodynamically stable.   Delay start of Pharmacological VTE agent (>24hrs) due to surgical blood loss or risk of bleeding: no
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
## 2015-04-03 ENCOUNTER — Encounter (HOSPITAL_BASED_OUTPATIENT_CLINIC_OR_DEPARTMENT_OTHER): Payer: Self-pay | Admitting: Plastic Surgery

## 2015-05-15 ENCOUNTER — Other Ambulatory Visit: Payer: Medicaid Other

## 2015-05-15 VITALS — BP 111/78 | HR 84 | Wt 120.0 lb

## 2015-05-15 DIAGNOSIS — N926 Irregular menstruation, unspecified: Secondary | ICD-10-CM

## 2015-05-17 ENCOUNTER — Encounter: Payer: Self-pay | Admitting: Certified Nurse Midwife

## 2015-05-17 ENCOUNTER — Ambulatory Visit (INDEPENDENT_AMBULATORY_CARE_PROVIDER_SITE_OTHER): Payer: Medicaid Other | Admitting: Certified Nurse Midwife

## 2015-05-17 ENCOUNTER — Telehealth: Payer: Self-pay

## 2015-05-17 ENCOUNTER — Other Ambulatory Visit: Payer: Self-pay | Admitting: Certified Nurse Midwife

## 2015-05-17 VITALS — BP 121/79 | HR 73 | Temp 98.4°F | Wt 120.0 lb

## 2015-05-17 DIAGNOSIS — Z0000 Encounter for general adult medical examination without abnormal findings: Secondary | ICD-10-CM | POA: Diagnosis not present

## 2015-05-17 DIAGNOSIS — Z803 Family history of malignant neoplasm of breast: Secondary | ICD-10-CM

## 2015-05-17 DIAGNOSIS — Z113 Encounter for screening for infections with a predominantly sexual mode of transmission: Secondary | ICD-10-CM

## 2015-05-17 DIAGNOSIS — N6320 Unspecified lump in the left breast, unspecified quadrant: Secondary | ICD-10-CM

## 2015-05-17 DIAGNOSIS — D071 Carcinoma in situ of vulva: Secondary | ICD-10-CM

## 2015-05-17 DIAGNOSIS — Z01419 Encounter for gynecological examination (general) (routine) without abnormal findings: Secondary | ICD-10-CM

## 2015-05-17 MED ORDER — VITAFOL GUMMIES 3.33-0.333-34.8 MG PO CHEW: 3.0000 | CHEWABLE_TABLET | Freq: Every day | ORAL | Status: DC
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
## 2015-05-17 NOTE — Telephone Encounter (Signed)
Left message with patient to call us regarding referrals and [facility]

## 2015-05-18 LAB — CBC WITH DIFFERENTIAL/PLATELET
BASOS ABS: 0 10*3/uL (ref 0.0–0.2)
BASOS: 0 %
EOS (ABSOLUTE): 0 10*3/uL (ref 0.0–0.4)
EOS: 0 %
HEMATOCRIT: 41.8 % (ref 34.0–46.6)
HEMOGLOBIN: 14.8 g/dL (ref 11.1–15.9)
IMMATURE GRANS (ABS): 0 10*3/uL (ref 0.0–0.1)
Immature Granulocytes: 0 %
LYMPHS: 36 %
Lymphocytes Absolute: 1.7 10*3/uL (ref 0.7–3.1)
MCH: 33.3 pg — ABNORMAL HIGH (ref 26.6–33.0)
MCHC: 35.4 g/dL (ref 31.5–35.7)
MCV: 94 fL (ref 79–97)
MONOCYTES: 7 %
Monocytes Absolute: 0.3 10*3/uL (ref 0.1–0.9)
NEUTROS ABS: 2.8 10*3/uL (ref 1.4–7.0)
Neutrophils: 57 %
PLATELETS: 300 10*3/uL (ref 150–379)
RBC: 4.44 x10E6/uL (ref 3.77–5.28)
RDW: 13.4 % (ref 12.3–15.4)
WBC: 4.8 10*3/uL (ref 3.4–10.8)

## 2015-05-18 LAB — COMPREHENSIVE METABOLIC PANEL
ALT: 12 IU/L (ref 0–32)
AST: 15 IU/L (ref 0–40)
Albumin/Globulin Ratio: 2 (ref 1.2–2.2)
Albumin: 4.7 g/dL (ref 3.5–5.5)
Alkaline Phosphatase: 54 IU/L (ref 39–117)
BUN/Creatinine Ratio: 20 (ref 8–20)
BUN: 13 mg/dL (ref 6–20)
Bilirubin Total: 0.6 mg/dL (ref 0.0–1.2)
CALCIUM: 9.4 mg/dL (ref 8.7–10.2)
CO2: 22 mmol/L (ref 18–29)
CREATININE: 0.66 mg/dL (ref 0.57–1.00)
Chloride: 100 mmol/L (ref 96–106)
GFR calc Af Amer: 136 mL/min/{1.73_m2} (ref >59)
GFR, EST NON AFRICAN AMERICAN: 118 mL/min/{1.73_m2} (ref >59)
Globulin, Total: 2.3 g/dL (ref 1.5–4.5)
Glucose: 69 mg/dL (ref 65–99)
Potassium: 4 mmol/L (ref 3.5–5.2)
Sodium: 142 mmol/L (ref 134–144)
TOTAL PROTEIN: 7 g/dL (ref 6.0–8.5)

## 2015-05-18 LAB — TSH: TSH: 0.519 u[IU]/mL (ref 0.450–4.500)

## 2015-05-18 LAB — HCG, SERUM, QUALITATIVE: HCG, BETA SUBUNIT, QUAL, SERUM: NEGATIVE m[IU]/mL (ref <6)

## 2015-05-18 LAB — RPR: RPR Ser Ql: NONREACTIVE

## 2015-05-18 LAB — HEPATITIS C ANTIBODY: Hep C Virus Ab: 0.1 s/co ratio (ref 0.0–0.9)

## 2015-05-18 LAB — HIV ANTIBODY (ROUTINE TESTING W REFLEX): HIV Screen 4th Generation wRfx: NONREACTIVE

## 2015-05-18 LAB — HEPATITIS B SURFACE ANTIGEN: HEP B S AG: NEGATIVE

## 2015-05-21 ENCOUNTER — Other Ambulatory Visit: Payer: Self-pay | Admitting: Certified Nurse Midwife

## 2015-05-21 LAB — NUSWAB VAGINITIS PLUS (VG+)
CANDIDA ALBICANS, NAA: NEGATIVE
CANDIDA GLABRATA, NAA: NEGATIVE
CHLAMYDIA TRACHOMATIS, NAA: NEGATIVE
NEISSERIA GONORRHOEAE, NAA: NEGATIVE
TRICH VAG BY NAA: POSITIVE — AB

## 2015-05-22 ENCOUNTER — Encounter: Payer: Self-pay | Admitting: Genetic Counselor

## 2015-05-22 ENCOUNTER — Telehealth: Payer: Self-pay | Admitting: Genetic Counselor

## 2015-05-22 LAB — PAP IG AND HPV HIGH-RISK
HPV, HIGH-RISK: NEGATIVE
PAP Smear Comment: 0

## 2015-05-22 NOTE — Telephone Encounter (Signed)
Verified address and insurance, spoke with pt regarding appts and pt inquired about all appt at the [facility],  Appointment confirmed; mail out new pt packet, completed intake

## 2015-05-23 ENCOUNTER — Ambulatory Visit
Admission: RE | Admit: 2015-05-23 | Discharge: 2015-05-23 | Disposition: A | Discharge status: Home / Self Care | Payer: Medicaid Other | Source: Ambulatory Visit | Attending: Certified Nurse Midwife | Admitting: Certified Nurse Midwife

## 2015-05-23 ENCOUNTER — Other Ambulatory Visit: Payer: Self-pay | Admitting: Certified Nurse Midwife

## 2015-05-23 DIAGNOSIS — A5903 Trichomonal cystitis and urethritis: Secondary | ICD-10-CM

## 2015-05-23 DIAGNOSIS — N6320 Unspecified lump in the left breast, unspecified quadrant: Secondary | ICD-10-CM

## 2015-05-23 MED ORDER — METRONIDAZOLE 500 MG PO TABS: 2000.0000 mg | ORAL_TABLET | Freq: Once | ORAL | Status: DC

## 2015-05-30 ENCOUNTER — Ambulatory Visit (HOSPITAL_BASED_OUTPATIENT_CLINIC_OR_DEPARTMENT_OTHER): Payer: Medicaid Other | Admitting: Genetic Counselor

## 2015-05-30 ENCOUNTER — Encounter: Payer: Self-pay | Admitting: Genetic Counselor

## 2015-05-30 ENCOUNTER — Other Ambulatory Visit: Payer: Medicaid Other

## 2015-05-30 DIAGNOSIS — Z87412 Personal history of vulvar dysplasia: Secondary | ICD-10-CM | POA: Diagnosis not present

## 2015-05-30 DIAGNOSIS — D071 Carcinoma in situ of vulva: Secondary | ICD-10-CM

## 2015-05-30 DIAGNOSIS — Z315 Encounter for genetic counseling: Secondary | ICD-10-CM

## 2015-05-30 DIAGNOSIS — Z803 Family history of malignant neoplasm of breast: Secondary | ICD-10-CM | POA: Diagnosis not present
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
## 2015-06-06 ENCOUNTER — Encounter: Payer: Self-pay | Admitting: Gynecologic Oncology

## 2015-06-06 ENCOUNTER — Ambulatory Visit: Payer: Medicaid Other | Attending: Gynecologic Oncology | Admitting: Gynecologic Oncology

## 2015-06-06 VITALS — BP 121/79 | HR 81 | Temp 98.4°F | Resp 20 | Ht 63.0 in | Wt 115.4 lb

## 2015-06-06 DIAGNOSIS — Z87891 Personal history of nicotine dependence: Secondary | ICD-10-CM | POA: Diagnosis not present

## 2015-06-06 DIAGNOSIS — Z803 Family history of malignant neoplasm of breast: Secondary | ICD-10-CM | POA: Insufficient documentation

## 2015-06-06 DIAGNOSIS — N901 Moderate vulvar dysplasia: Secondary | ICD-10-CM | POA: Diagnosis present

## 2015-06-06 DIAGNOSIS — D071 Carcinoma in situ of vulva: Secondary | ICD-10-CM | POA: Diagnosis not present
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
## 2015-06-06 NOTE — Patient Instructions (Signed)
We will call you with the results of your biopsies from today.  Please call for any questions or concerns.  Vulva Biopsy, Care After These instructions give you information on caring for yourself after your procedure. Your doctor may also give you more specific instructions. Call your doctor if you have any problems or questions after your procedure. HOME CARE   Only take medicine as told by your doctor.  Do not rub the biopsy area after you pee (urinate). Gently pat the area dry. You can use a bottle filled with warm water (peri-bottle) to clean the area. Gently wipe from front to back.  Clean the area using water and mild soap. Gently pat the area dry.  Check the biopsy area every day using a mirror. Make sure it is healing.  Do not have sex (intercourse) for 3-4 days or as told by your doctor.  Avoid exercise (running, biking) for 2-3 days or as told by your doctor.  You may shower after the procedure. Avoid bathing and swimming until the stitches (sutures) dissolve and healing is complete.  Wear loose cotton underwear. Do not wear tight pants.  Keep all follow-up visits with your doctor.  Call your doctor to get your test results. GET HELP RIGHT AWAY IF:   You have pain that gets worse and is not helped by pain medicine.  Your vaginal area becomes puffy (swollen) or red.  You have fluid or an abnormally bad smell coming from your vagina.  You have a fever. MAKE SURE YOU:  Understand these instructions.  Will watch your condition.  Will get help right away if you are not doing well or get worse.   This information is not intended to replace advice given to you by your health care provider. Make sure you discuss any questions you have with your health care provider.   Document Released: 05/02/2008 Document Revised: 02/24/2014 Document Reviewed: 12/01/2011 Elsevier Interactive Patient Education [employer].

## 2015-06-08 ENCOUNTER — Telehealth: Payer: Self-pay | Admitting: Gynecologic Oncology

## 2015-06-08 DIAGNOSIS — D071 Carcinoma in situ of vulva: Secondary | ICD-10-CM

## 2015-06-08 DIAGNOSIS — L280 Lichen simplex chronicus: Secondary | ICD-10-CM

## 2015-06-08 MED ORDER — CLOBETASOL PROPIONATE 0.05 % EX OINT: 1.0000 "application " | TOPICAL_OINTMENT | Freq: Two times a day (BID) | CUTANEOUS | Status: AC

## 2015-06-08 NOTE — Telephone Encounter (Signed)
Informed the patient of VIN 3 (completely resected with biopsy) and lichen simplex chronicus which we will treat with clobetasol for 1 month after incisions have healed. Will need close follow-up bse VIN3 likely to recur - discussed this with patient.

## 2015-06-21 ENCOUNTER — Ambulatory Visit (INDEPENDENT_AMBULATORY_CARE_PROVIDER_SITE_OTHER): Payer: Medicaid Other | Admitting: Certified Nurse Midwife

## 2015-06-21 VITALS — BP 134/84 | HR 83 | Wt 116.0 lb

## 2015-06-21 DIAGNOSIS — N939 Abnormal uterine and vaginal bleeding, unspecified: Secondary | ICD-10-CM

## 2015-06-21 DIAGNOSIS — N926 Irregular menstruation, unspecified: Secondary | ICD-10-CM | POA: Diagnosis not present

## 2015-06-21 DIAGNOSIS — M25559 Pain in unspecified hip: Secondary | ICD-10-CM

## 2015-06-21 DIAGNOSIS — S31104A Unspecified open wound of abdominal wall, left lower quadrant without penetration into peritoneal cavity, initial encounter: Secondary | ICD-10-CM

## 2015-06-21 MED ORDER — CEPHALEXIN 500 MG PO CAPS: 500.0000 mg | ORAL_CAPSULE | Freq: Three times a day (TID) | ORAL | Status: DC

## 2015-06-21 MED ORDER — MUPIROCIN 2 % EX OINT: 1.0000 "application " | TOPICAL_OINTMENT | Freq: Two times a day (BID) | CUTANEOUS | Status: DC | PRN

## 2015-06-21 MED ORDER — TRIPLE ANTIBIOTIC 5-400-5000 EX OINT: TOPICAL_OINTMENT | Freq: Four times a day (QID) | CUTANEOUS | Status: DC

## 2015-06-22 ENCOUNTER — Encounter: Payer: Self-pay | Admitting: Certified Nurse Midwife
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
## 2015-06-25 ENCOUNTER — Telehealth: Payer: Self-pay | Admitting: Genetic Counselor

## 2015-06-25 NOTE — Telephone Encounter (Signed)
VM message stating that it has not been set up yet.

## 2015-06-25 NOTE — Telephone Encounter (Signed)
Revealed negative genetic testing in the Breast/Ovarian cancer panel.

## 2015-06-26 ENCOUNTER — Ambulatory Visit: Payer: Self-pay | Admitting: Genetic Counselor

## 2015-06-26 DIAGNOSIS — Z1379 Encounter for other screening for genetic and chromosomal anomalies: Secondary | ICD-10-CM | POA: Insufficient documentation

## 2015-06-26 DIAGNOSIS — D071 Carcinoma in situ of vulva: Secondary | ICD-10-CM

## 2015-06-26 DIAGNOSIS — Z803 Family history of malignant neoplasm of breast: Secondary | ICD-10-CM

## 2015-06-26 NOTE — Progress Notes (Signed)
HPI: Regina Villegas was previously seen in the [facility] due to a personal and family history of cancer and concerns regarding a hereditary predisposition to cancer. Please refer to our prior cancer genetics clinic note for more information regarding Regina Villegas's medical, social and family histories, and our assessment and recommendations, at the time. Regina Villegas recent genetic test results were disclosed to her, as were recommendations warranted by these results. These results and recommendations are discussed in more detail below.  FAMILY HISTORY:  We obtained a detailed, 4-generation family history.  Significant diagnoses are listed below: Family History  Problem Relation Age of Onset  . Prostate cancer Maternal Uncle 58  . Breast cancer Paternal Aunt 59  . Breast cancer Other 1    MGMs sister  . Breast cancer Cousin     mother's maternal first cousin dx under 71  . Breast cancer Cousin     Mother's maternal first cousin dx <45    The patient ha two sons and a daughter and is currently pregnant. All children are cancer free. She has tow maternal half brothers and two paternal half brothers and half sister who are all cancer free. Her parents are both living. Her father is 35. He has six brothers and six sisters. One sister was diagnosed with breast cancer at age 32. There is no other cancer on this side of the family. The patient's mother is alive and well at age 50. She has four maternal half brothers and four maternal half sisters. One brother has prostate cancer and one sister died of an unknown cancer. The patient's maternal grandmother died at age 47. She has a sister who had breast cancer at 74. This sister had two daughters and a son. Both daughters died of breast cancer under the age of 62 and the son developed prostate cancer around age 31. Patient's maternal ancestors are of [place] American and [place] Indican descent, and paternal ancestors are  of African American descent. There is no reported Ashkenazi Jewish ancestry. There is no known consanguinity.  GENETIC TEST RESULTS: At the time of Regina Villegas's visit, we recommended she pursue genetic testing of the Breast/Ovarian cancer gene panel. The Breast/Ovarian gene panel offered by [facility] includes sequencing and rearrangement analysis for the following 20 genes:  ATM, BARD1, BRCA1, BRCA2, BRIP1, CDH1, CHEK2, EPCAM, FANCC, MLH1, MSH2, MSH6, NBN, PALB2, PMS2, PTEN, RAD51C, RAD51D, TP53, and XRCC2.   The report date is Jun 22, 2015.  Genetic testing was normal, and did not reveal a deleterious mutation in these genes. The test report has been scanned into EPIC and is located under the Molecular Pathology section of the Results Review tab.   We discussed with Regina Villegas that since the current genetic testing is not perfect, it is possible there may be a gene mutation in one of these genes that current testing cannot detect, but that chance is small. We also discussed, that it is possible that another gene that has not yet been discovered, or that we have not yet tested, is responsible for the cancer diagnoses in the family, and it is, therefore, important to remain in touch with cancer genetics in the future so that we can continue to offer Regina Villegas the most up to date genetic testing.   CANCER SCREENING RECOMMENDATIONS: This normal result is reassuring and indicates that Regina Villegas does not likely have an increased risk of cancer due to a mutation in one of these genes. We discussed that  there could be a hereditary cancer syndrome that was causing the breast cancer in her extended relatives, but that maybe it was not passed down in her line of inheritance.  We, therefore, recommended  Regina Villegas continue to follow the cancer screening guidelines provided by her primary healthcare providers.   RECOMMENDATIONS FOR FAMILY MEMBERS: Women in this family might be at some increased risk of developing  cancer, over the general population risk, simply due to the family history of cancer. We recommended women in this family have a yearly mammogram beginning at age 59, or 83 years younger than the earliest onset of cancer, an an annual clinical breast exam, and perform monthly breast self-exams. Women in this family should also have a gynecological exam as recommended by their primary provider. All family members should have a colonoscopy by age 66.  FOLLOW-UP: Lastly, we discussed with Regina Villegas that cancer genetics is a rapidly advancing field and it is possible that new genetic tests will be appropriate for her and/or her family members in the future. We encouraged her to remain in contact with cancer genetics on an annual basis so we can update her personal and family histories and let her know of advances in cancer genetics that may benefit this family.   Our contact number was provided. Regina Villegas questions were answered to her satisfaction, and she knows she is welcome to call us at anytime with additional questions or concerns.   Roma Kayser, MS, [facility] Certified Genetic Counselor [email or web address]

## 2015-07-05 ENCOUNTER — Ambulatory Visit (INDEPENDENT_AMBULATORY_CARE_PROVIDER_SITE_OTHER): Payer: Medicaid Other | Admitting: Certified Nurse Midwife

## 2015-07-05 ENCOUNTER — Ambulatory Visit (INDEPENDENT_AMBULATORY_CARE_PROVIDER_SITE_OTHER): Payer: Medicaid Other

## 2015-07-05 VITALS — BP 108/72 | HR 80 | Wt 114.0 lb

## 2015-07-05 DIAGNOSIS — N939 Abnormal uterine and vaginal bleeding, unspecified: Secondary | ICD-10-CM | POA: Diagnosis not present

## 2015-07-05 DIAGNOSIS — L680 Hirsutism: Secondary | ICD-10-CM

## 2015-07-05 DIAGNOSIS — N926 Irregular menstruation, unspecified: Secondary | ICD-10-CM | POA: Diagnosis not present

## 2015-07-05 DIAGNOSIS — M25559 Pain in unspecified hip: Secondary | ICD-10-CM

## 2015-07-05 DIAGNOSIS — Z3169 Encounter for other general counseling and advice on procreation: Secondary | ICD-10-CM

## 2015-07-05 MED ORDER — SPIRONOLACTONE 50 MG PO TABS: 50.0000 mg | ORAL_TABLET | Freq: Every day | ORAL | Status: DC
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
## 2015-07-06 ENCOUNTER — Encounter: Payer: Self-pay | Admitting: Certified Nurse Midwife

## 2015-07-12 LAB — TSH: TSH: 0.895 u[IU]/mL (ref 0.450–4.500)

## 2015-07-12 LAB — FSH/LH
FSH: 3.3 m[IU]/mL
LH: 16.3 m[IU]/mL

## 2015-07-12 LAB — PROLACTIN: Prolactin: 14.8 ng/mL (ref 4.8–23.3)

## 2015-07-12 LAB — 17-HYDROXYPROGESTERONE: 17-Hydroxyprogesterone: 493 ng/dL

## 2015-07-12 LAB — PROGESTERONE: Progesterone: 11.4 ng/mL

## 2015-07-12 LAB — TESTOSTERONE, FREE, TOTAL, SHBG
SEX HORMONE BINDING: 80.4 nmol/L (ref 24.6–122.0)
Testosterone, Free: 0.7 pg/mL (ref 0.0–4.2)
Testosterone: 88 ng/dL — ABNORMAL HIGH (ref 8–48)

## 2015-07-12 LAB — HEMOGLOBIN A1C
Est. average glucose Bld gHb Est-mCnc: 97 mg/dL
HEMOGLOBIN A1C: 5 % (ref 4.8–5.6)

## 2015-07-24 ENCOUNTER — Encounter (HOSPITAL_COMMUNITY): Payer: Self-pay

## 2015-09-13 ENCOUNTER — Encounter: Payer: Self-pay | Admitting: *Deleted

## 2015-09-13 ENCOUNTER — Other Ambulatory Visit (INDEPENDENT_AMBULATORY_CARE_PROVIDER_SITE_OTHER): Payer: Medicaid Other | Admitting: *Deleted

## 2015-09-13 VITALS — BP 111/76 | HR 72 | Temp 98.3°F | Wt 116.0 lb

## 2015-09-13 DIAGNOSIS — R5383 Other fatigue: Secondary | ICD-10-CM

## 2015-09-13 DIAGNOSIS — Z3202 Encounter for pregnancy test, result negative: Secondary | ICD-10-CM

## 2015-09-13 LAB — POCT URINE PREGNANCY: PREG TEST UR: NEGATIVE

## 2015-11-12 ENCOUNTER — Encounter: Payer: Self-pay | Admitting: Gynecologic Oncology

## 2015-11-12 ENCOUNTER — Ambulatory Visit: Payer: Medicaid Other | Attending: Gynecologic Oncology | Admitting: Gynecologic Oncology

## 2015-11-12 DIAGNOSIS — Z885 Allergy status to narcotic agent status: Secondary | ICD-10-CM | POA: Insufficient documentation

## 2015-11-12 DIAGNOSIS — Z87891 Personal history of nicotine dependence: Secondary | ICD-10-CM | POA: Diagnosis not present

## 2015-11-12 DIAGNOSIS — R140 Abdominal distension (gaseous): Secondary | ICD-10-CM | POA: Insufficient documentation

## 2015-11-12 DIAGNOSIS — Z859 Personal history of malignant neoplasm, unspecified: Secondary | ICD-10-CM | POA: Insufficient documentation

## 2015-11-12 DIAGNOSIS — Z803 Family history of malignant neoplasm of breast: Secondary | ICD-10-CM | POA: Diagnosis not present

## 2015-11-12 DIAGNOSIS — Z809 Family history of malignant neoplasm, unspecified: Secondary | ICD-10-CM | POA: Insufficient documentation

## 2015-11-12 DIAGNOSIS — D071 Carcinoma in situ of vulva: Secondary | ICD-10-CM | POA: Diagnosis not present

## 2015-11-12 DIAGNOSIS — N750 Cyst of Bartholin's gland: Secondary | ICD-10-CM | POA: Insufficient documentation

## 2015-11-12 DIAGNOSIS — Z8042 Family history of malignant neoplasm of prostate: Secondary | ICD-10-CM | POA: Insufficient documentation
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
## 2015-11-12 NOTE — Patient Instructions (Signed)
Plan to follow up in one week or sooner if needed.  Plan to have an ultrasound for evaluation of abdominal bloating symptoms.     Bartholin Cyst or Abscess A Bartholin cyst is a fluid-filled sac that forms on a Bartholin gland. Bartholin glands are small glands that are found in the folds of skin (labia) on the sides of the lower opening of the vagina. This type of cyst causes a bulge on the side of the vagina. A cyst that is not large or infected may not cause problems. However, if the fluid in the cyst becomes infected, the cyst can turn into an abscess. An abscess may cause discomfort or pain. HOME CARE  Take medicines only as told by your doctor.  If you were prescribed an antibiotic medicine, finish all of it even if you start to feel better.  Apply warm, wet compresses to the area or take warm, shallow baths that cover your pelvic area (sitz baths). Do this many times each day or as told by your doctor.  Do not squeeze the cyst. Do not apply heavy pressure to it.  Do not have sex until the cyst has gone away.  If your cyst or abscess was opened by your doctor, a small piece of gauze or a drain may have been placed in the area. That lets the cyst drain. Do not remove the gauze or the drain until your doctor tells you it is okay to do that.  Do not wear tampons. Wear feminine pads as needed for any fluid or blood.  Keep all follow-up visits as told by your doctor. This is important. GET HELP IF:  Your pain, puffiness (swelling), or redness in the area of the cyst gets worse.  You have fluid or pus pus coming from the cyst.  You have a fever.   This information is not intended to replace advice given to you by your health care provider. Make sure you discuss any questions you have with your health care provider.   Document Released: 05/02/2008 Document Revised: 06/20/2014 Document Reviewed: 09/19/2013 Elsevier Interactive Patient Education [employer].

## 2015-11-13 ENCOUNTER — Telehealth: Payer: Self-pay | Admitting: Gynecologic Oncology

## 2015-11-13 DIAGNOSIS — R102 Pelvic and perineal pain: Secondary | ICD-10-CM

## 2015-11-13 MED ORDER — POLYETHYLENE GLYCOL 3350 17 G PO PACK: 17.0000 g | PACK | Freq: Every day | ORAL | 0 refills | Status: DC

## 2015-11-13 MED ORDER — DOCUSATE SODIUM 100 MG PO CAPS: 100.0000 mg | ORAL_CAPSULE | Freq: Every day | ORAL | 0 refills | Status: DC

## 2015-11-13 MED ORDER — OXYCODONE HCL 5 MG PO TABS: 5.0000 mg | ORAL_TABLET | Freq: Four times a day (QID) | ORAL | 0 refills | Status: DC | PRN

## 2015-11-13 NOTE — Telephone Encounter (Signed)
Returned call to patient.  She stated Dr. Denman George was supposed to give her pain medication and medications for her bowels yesterday in the office but she did not receive any prescriptions.  Reporting moderate vulvar pain.  Patient stating she cannot take tramadol.  Script to be written for oxycodone.  Advised to not take and drive.  She is going to come and pick up the prescription.  She is advised to call our office with an update on how she is doing.

## 2015-11-16 ENCOUNTER — Ambulatory Visit (HOSPITAL_COMMUNITY)
Admission: RE | Admit: 2015-11-16 | Discharge: 2015-11-16 | Disposition: A | Discharge status: Home / Self Care | Payer: Medicaid Other | Source: Ambulatory Visit | Attending: Gynecologic Oncology | Admitting: Gynecologic Oncology

## 2015-11-16 DIAGNOSIS — N838 Other noninflammatory disorders of ovary, fallopian tube and broad ligament: Secondary | ICD-10-CM | POA: Diagnosis not present

## 2015-11-16 DIAGNOSIS — R140 Abdominal distension (gaseous): Secondary | ICD-10-CM | POA: Diagnosis present

## 2015-11-16 IMAGING — US US TRANSVAGINAL NON-OB
1 series · 15 of 25 positions shown · non-contrast
Comparison: Pelvic ultrasound [DATE]

CLINICAL DATA: Patient with abdominal distension. Irregular
periods.

EXAM:
TRANSABDOMINAL AND TRANSVAGINAL ULTRASOUND OF PELVIS
TECHNIQUE: Both transabdominal and transvaginal ultrasound examinations of the
pelvis were performed. Transabdominal technique was performed for
global imaging of the pelvis including uterus, ovaries, adnexal
regions, and pelvic cul-de-sac. It was necessary to proceed with
endovaginal exam following the transabdominal exam to visualize the
endometrium and adnexal structures.

[Series 1: us transvaginal non-ob · 15 of 53 slices shown]
[im 1/53]
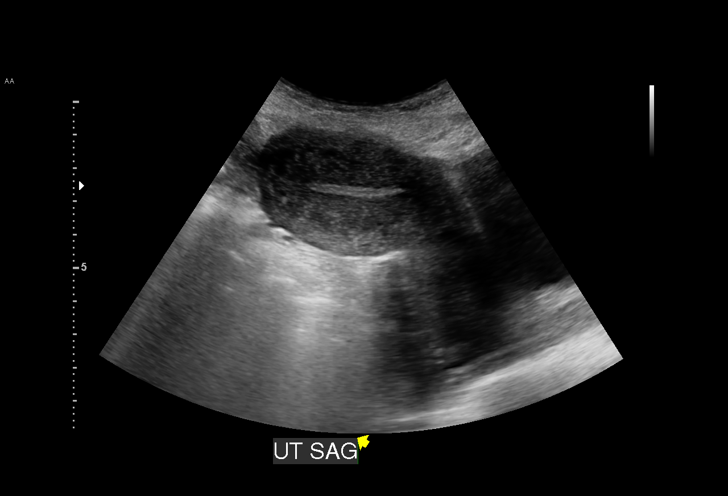
[im 5/53]
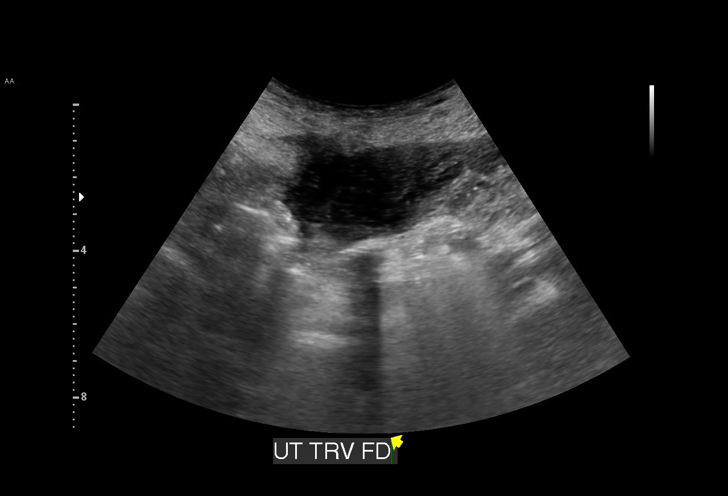
[im 9/53]
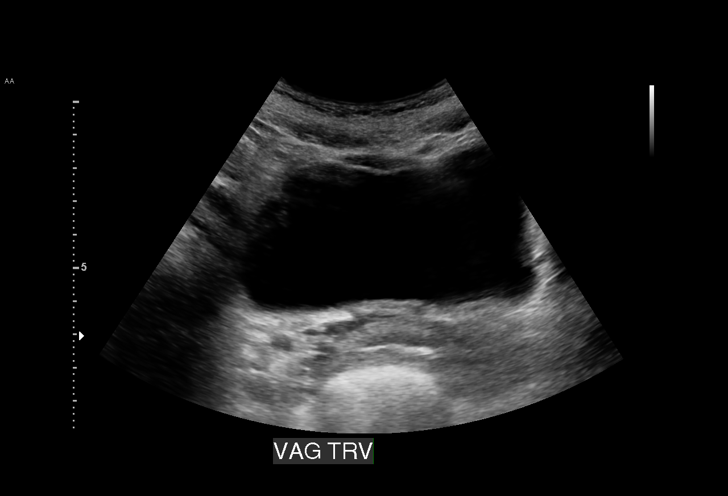
[im 11/53]
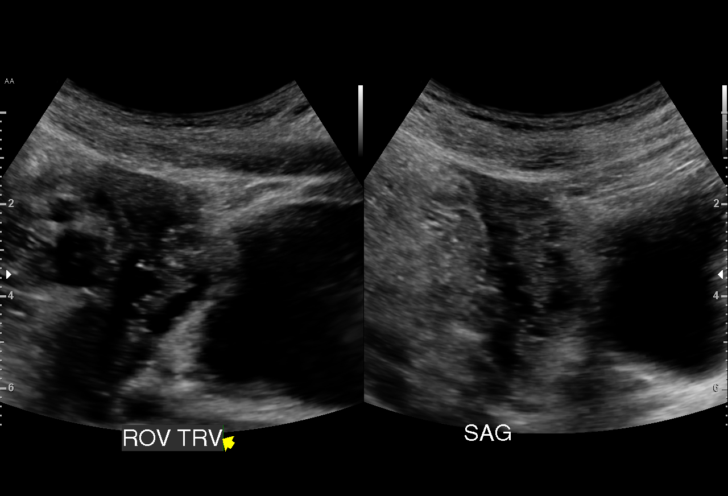
[im 16/53]
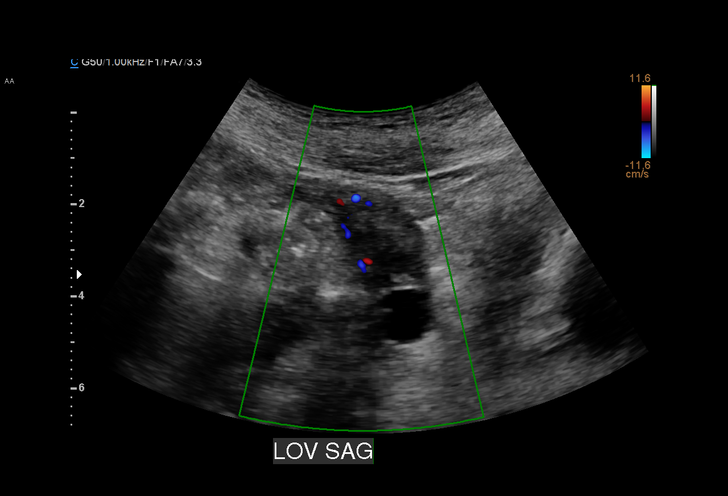
[im 20/53]
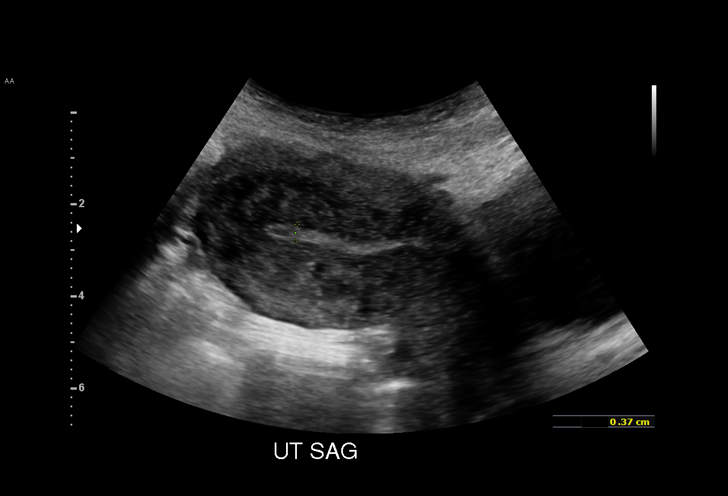
[im 22/53]
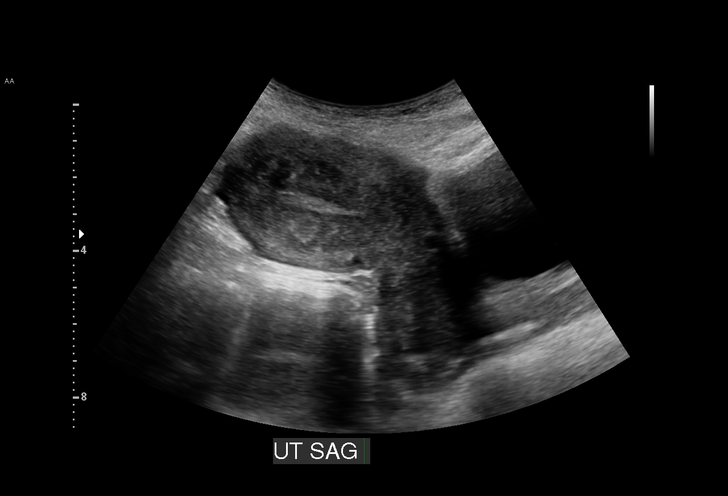
[im 27/53]
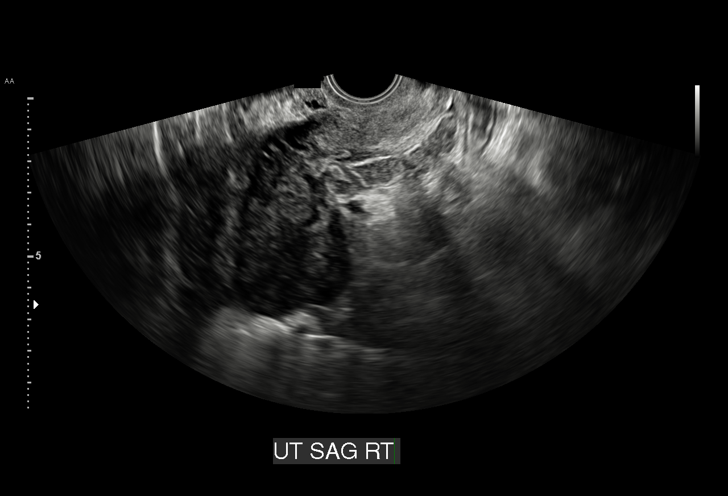
[im 31/53]
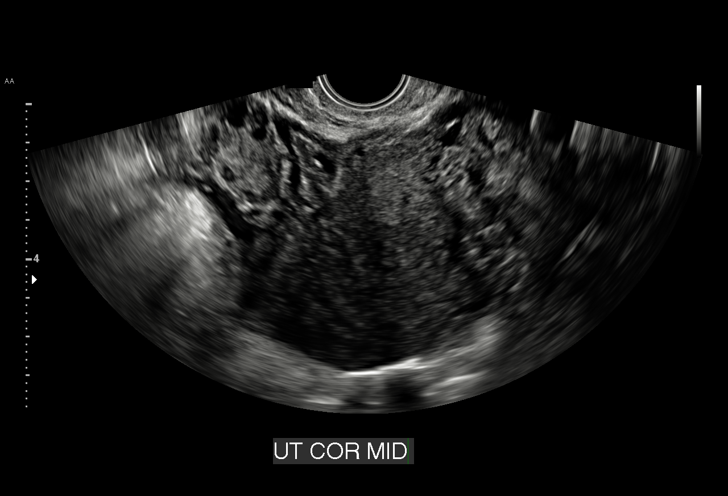
[im 33/53]
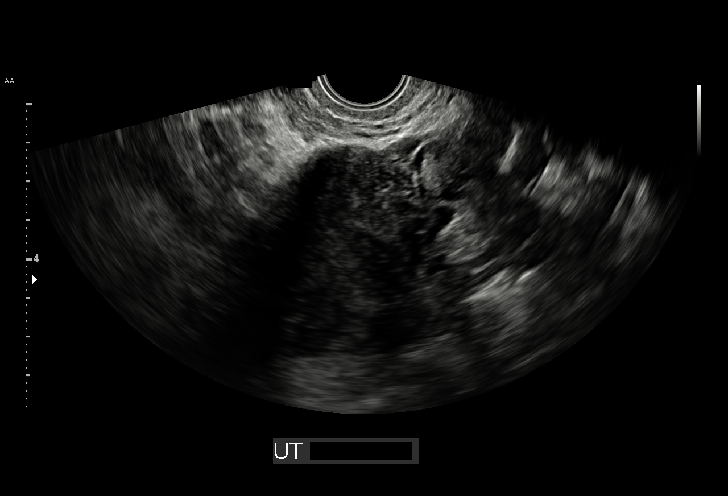
[im 37/53]
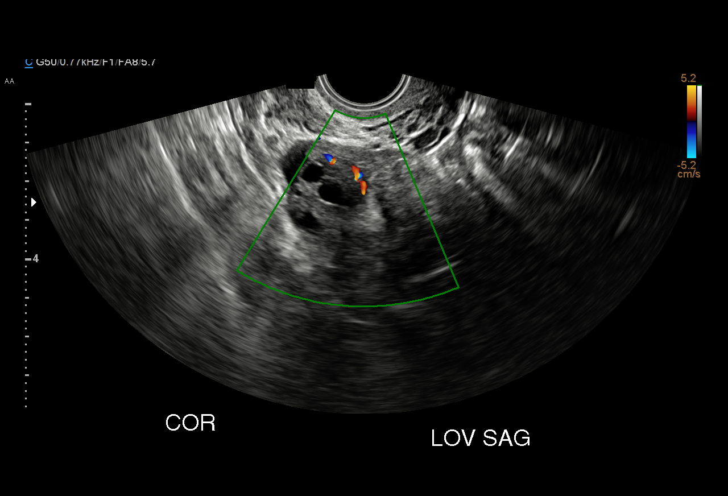
[im 42/53]
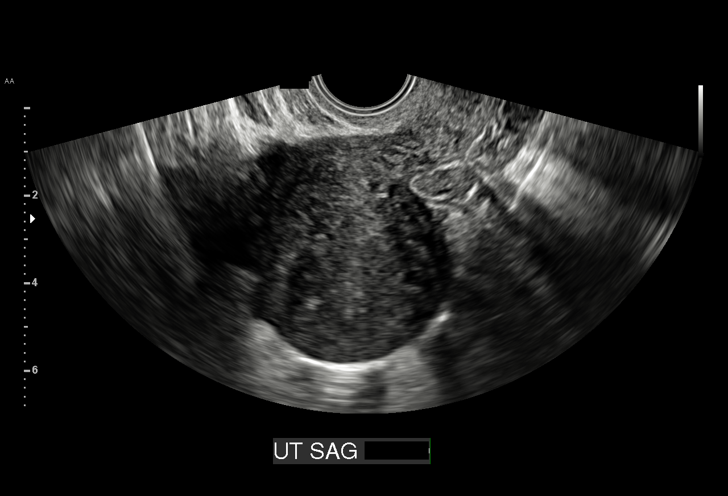
[im 44/53]
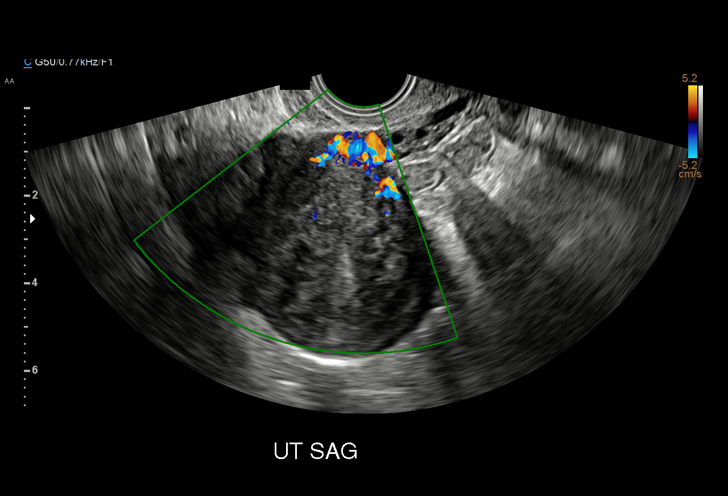
[im 48/53]
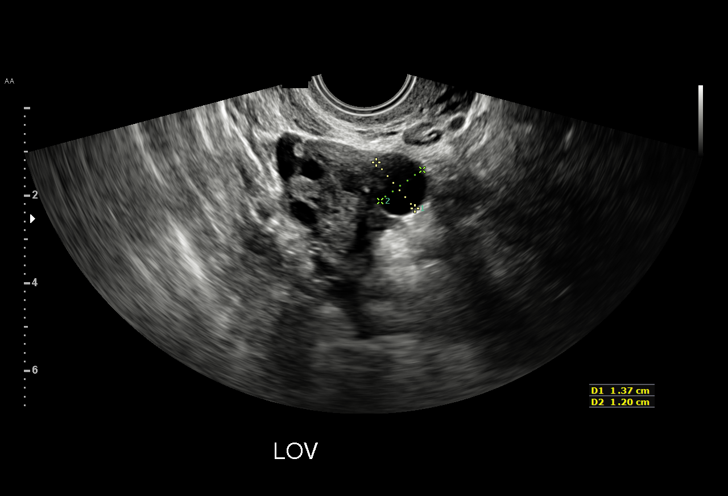
[im 53/53]
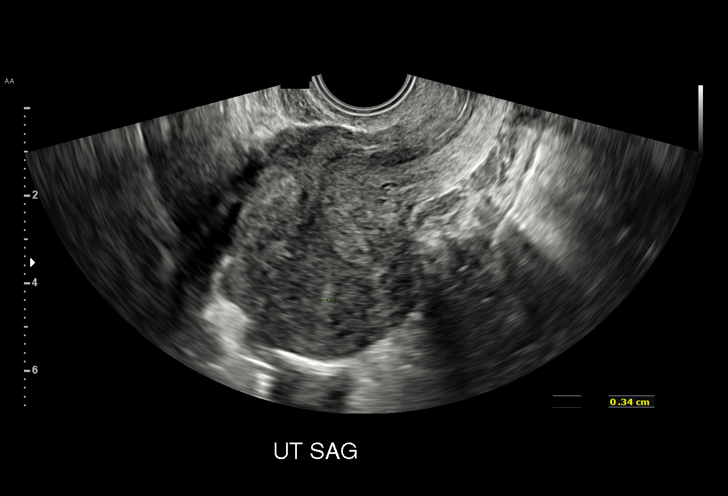

[15 of 25 positions shown; findings below may reference images not displayed]

FINDINGS: Uterus

Measurements: 10.2 x 4.7 x 5.5 cm. No fibroids or other mass
visualized. Uterus is retroverted.

Endometrium

Thickness: 4 mm.  No focal abnormality visualized.

Right ovary

Measurements: 2.8 x 2.1 x 2.1 cm. Normal appearance/no adnexal mass.

Left ovary

Measurements: 3.4 x 1.7 x 1.7 cm. Normal appearance/no adnexal mass.

Other findings

Within the left adnexa, adjacent to the left ovary, there is a 1.3 x
1.2 x 1.1 cm simple cyst. This likely represents a paraovarian or
paratubal cyst.
IMPRESSION: Left adnexal simple cyst, likely reflective of a paraovarian or
paratubal cyst.

Unremarkable ovaries.

Endometrium is unremarkable.

## 2015-11-19 ENCOUNTER — Ambulatory Visit: Payer: Medicaid Other | Attending: Gynecologic Oncology | Admitting: Gynecologic Oncology

## 2015-11-19 ENCOUNTER — Encounter: Payer: Self-pay | Admitting: Gynecologic Oncology

## 2015-11-19 VITALS — BP 115/73 | HR 72 | Temp 98.2°F | Resp 18 | Ht 63.0 in | Wt 118.1 lb

## 2015-11-19 DIAGNOSIS — Z87898 Personal history of other specified conditions: Secondary | ICD-10-CM | POA: Diagnosis not present

## 2015-11-19 DIAGNOSIS — Z87891 Personal history of nicotine dependence: Secondary | ICD-10-CM | POA: Diagnosis not present

## 2015-11-19 DIAGNOSIS — Z803 Family history of malignant neoplasm of breast: Secondary | ICD-10-CM | POA: Diagnosis not present

## 2015-11-19 DIAGNOSIS — Z8042 Family history of malignant neoplasm of prostate: Secondary | ICD-10-CM | POA: Diagnosis not present

## 2015-11-19 DIAGNOSIS — N901 Moderate vulvar dysplasia: Secondary | ICD-10-CM | POA: Insufficient documentation

## 2015-11-19 DIAGNOSIS — D071 Carcinoma in situ of vulva: Secondary | ICD-10-CM | POA: Diagnosis not present

## 2015-11-19 DIAGNOSIS — R140 Abdominal distension (gaseous): Secondary | ICD-10-CM | POA: Diagnosis not present

## 2015-11-19 DIAGNOSIS — Z885 Allergy status to narcotic agent status: Secondary | ICD-10-CM | POA: Diagnosis not present

## 2015-11-19 DIAGNOSIS — N750 Cyst of Bartholin's gland: Secondary | ICD-10-CM | POA: Diagnosis not present

## 2015-11-19 DIAGNOSIS — Z9889 Other specified postprocedural states: Secondary | ICD-10-CM | POA: Insufficient documentation

## 2015-11-19 DIAGNOSIS — Z8669 Personal history of other diseases of the nervous system and sense organs: Secondary | ICD-10-CM | POA: Diagnosis not present

## 2015-11-19 NOTE — Patient Instructions (Signed)
Please call us if the area on your vulva does not continue to decrease in size and improve.  Plan to follow up in six months or sooner if needed.  Please call us closer to the date at [phone] to schedule your appt.
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
## 2015-11-21 ENCOUNTER — Other Ambulatory Visit: Payer: Self-pay | Admitting: Gynecologic Oncology

## 2015-11-21 DIAGNOSIS — R102 Pelvic and perineal pain: Secondary | ICD-10-CM

## 2015-11-21 MED ORDER — OXYCODONE HCL 5 MG PO TABS: 5.0000 mg | ORAL_TABLET | Freq: Four times a day (QID) | ORAL | 0 refills | Status: DC | PRN

## 2015-12-10 ENCOUNTER — Ambulatory Visit: Payer: Medicaid Other | Admitting: Gynecologic Oncology

## 2015-12-14 ENCOUNTER — Other Ambulatory Visit: Payer: Self-pay | Admitting: Gynecologic Oncology

## 2015-12-14 DIAGNOSIS — D071 Carcinoma in situ of vulva: Secondary | ICD-10-CM

## 2016-01-09 ENCOUNTER — Ambulatory Visit (INDEPENDENT_AMBULATORY_CARE_PROVIDER_SITE_OTHER): Payer: Medicaid Other | Admitting: Certified Nurse Midwife

## 2016-01-09 VITALS — BP 117/74 | HR 91 | Wt 119.1 lb

## 2016-01-09 DIAGNOSIS — Z8619 Personal history of other infectious and parasitic diseases: Secondary | ICD-10-CM | POA: Diagnosis not present

## 2016-01-09 DIAGNOSIS — N750 Cyst of Bartholin's gland: Secondary | ICD-10-CM

## 2016-01-09 DIAGNOSIS — F331 Major depressive disorder, recurrent, moderate: Secondary | ICD-10-CM

## 2016-01-09 DIAGNOSIS — K5901 Slow transit constipation: Secondary | ICD-10-CM

## 2016-01-09 DIAGNOSIS — R140 Abdominal distension (gaseous): Secondary | ICD-10-CM | POA: Diagnosis not present

## 2016-01-09 DIAGNOSIS — R198 Other specified symptoms and signs involving the digestive system and abdomen: Secondary | ICD-10-CM | POA: Diagnosis not present

## 2016-01-09 DIAGNOSIS — N901 Moderate vulvar dysplasia: Secondary | ICD-10-CM

## 2016-01-09 MED ORDER — SERTRALINE HCL 50 MG PO TABS: 50.0000 mg | ORAL_TABLET | Freq: Every day | ORAL | 12 refills | Status: DC
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
## 2016-01-12 LAB — NUSWAB VAGINITIS PLUS (VG+)
ATOPOBIUM VAGINAE: HIGH {score} — AB
BVAB 2: HIGH {score} — AB
CANDIDA ALBICANS, NAA: NEGATIVE
CHLAMYDIA TRACHOMATIS, NAA: NEGATIVE
Candida glabrata, NAA: NEGATIVE
MEGASPHAERA 1: HIGH {score} — AB
Neisseria gonorrhoeae, NAA: NEGATIVE
Trich vag by NAA: POSITIVE — AB

## 2016-01-14 ENCOUNTER — Encounter: Payer: Self-pay | Admitting: Certified Nurse Midwife

## 2016-01-16 ENCOUNTER — Encounter: Payer: Self-pay | Admitting: Physician Assistant

## 2016-01-17 ENCOUNTER — Other Ambulatory Visit: Payer: Self-pay | Admitting: Certified Nurse Midwife

## 2016-01-17 DIAGNOSIS — A599 Trichomoniasis, unspecified: Secondary | ICD-10-CM

## 2016-01-17 DIAGNOSIS — N760 Acute vaginitis: Principal | ICD-10-CM

## 2016-01-17 DIAGNOSIS — B9689 Other specified bacterial agents as the cause of diseases classified elsewhere: Secondary | ICD-10-CM

## 2016-01-17 MED ORDER — METRONIDAZOLE 500 MG PO TABS: 500.0000 mg | ORAL_TABLET | Freq: Two times a day (BID) | ORAL | 0 refills | Status: DC

## 2016-01-24 ENCOUNTER — Inpatient Hospital Stay (HOSPITAL_COMMUNITY)
Admission: AD | Admit: 2016-01-24 | Discharge: 2016-01-24 | Disposition: A | Discharge status: Home / Self Care | Payer: Medicaid Other | Source: Ambulatory Visit | Attending: Obstetrics and Gynecology | Admitting: Obstetrics and Gynecology

## 2016-01-24 ENCOUNTER — Encounter (HOSPITAL_COMMUNITY): Payer: Self-pay

## 2016-01-24 DIAGNOSIS — Z3202 Encounter for pregnancy test, result negative: Secondary | ICD-10-CM | POA: Insufficient documentation

## 2016-01-24 DIAGNOSIS — Z87891 Personal history of nicotine dependence: Secondary | ICD-10-CM | POA: Insufficient documentation

## 2016-01-24 DIAGNOSIS — A599 Trichomoniasis, unspecified: Secondary | ICD-10-CM

## 2016-01-24 DIAGNOSIS — A5901 Trichomonal vulvovaginitis: Secondary | ICD-10-CM | POA: Diagnosis not present

## 2016-01-24 DIAGNOSIS — N809 Endometriosis, unspecified: Secondary | ICD-10-CM | POA: Diagnosis not present

## 2016-01-24 DIAGNOSIS — R102 Pelvic and perineal pain: Secondary | ICD-10-CM

## 2016-01-24 LAB — POCT PREGNANCY, URINE: PREG TEST UR: NEGATIVE

## 2016-01-24 LAB — WET PREP, GENITAL
CLUE CELLS WET PREP: NONE SEEN
SPERM: NONE SEEN
WBC, Wet Prep HPF POC: NONE SEEN
Yeast Wet Prep HPF POC: NONE SEEN

## 2016-01-24 LAB — URINALYSIS, ROUTINE W REFLEX MICROSCOPIC
BILIRUBIN URINE: NEGATIVE
Bacteria, UA: NONE SEEN
GLUCOSE, UA: NEGATIVE mg/dL
Ketones, ur: NEGATIVE mg/dL
Leukocytes, UA: NEGATIVE
NITRITE: NEGATIVE
PH: 5 (ref 5.0–8.0)
Protein, ur: NEGATIVE mg/dL
SPECIFIC GRAVITY, URINE: 1.008 (ref 1.005–1.030)
WBC, UA: NONE SEEN WBC/hpf (ref 0–5)

## 2016-01-24 MED ORDER — METRONIDAZOLE 500 MG PO TABS: 500.0000 mg | ORAL_TABLET | Freq: Two times a day (BID) | ORAL | 0 refills | Status: AC

## 2016-01-24 MED ORDER — IBUPROFEN 600 MG PO TABS: 600.0000 mg | ORAL_TABLET | Freq: Four times a day (QID) | ORAL | 1 refills | Status: DC | PRN

## 2016-01-24 MED ORDER — IBUPROFEN 600 MG PO TABS
600.0000 mg | ORAL_TABLET | Freq: Once | ORAL | Status: AC
  Administered 2016-01-24: 600 mg via ORAL
  Filled 2016-01-24: qty 1

## 2016-01-24 NOTE — Discharge Instructions (Signed)
Endometriosis Endometriosis is a condition in which the tissue that lines the uterus (endometrium) grows outside of its normal location. The tissue may grow in many locations close to the uterus, but it commonly grows on the ovaries, fallopian tubes, vagina, or bowel. When the uterus sheds the endometrium every menstrual cycle, there is bleeding wherever the endometrial tissue is located. This can cause pain because blood is irritating to tissues that are not normally exposed to it. What are the causes? The cause of endometriosis is not known. What increases the risk? You may be more likely to develop endometriosis if you:  Have a family history of endometriosis.  Have never given birth.  Started your period at age 10 or younger.  Have high levels of estrogen in your body.  Were exposed to a certain medicine (diethylstilbestrol) before you were born (in utero).  Had low birth weight.  Were born as a twin, triplet, or other multiple.  Have a BMI of less than 25. BMI is an estimate of body fat and is calculated from height and weight. What are the signs or symptoms? Often, there are no symptoms of this condition. If you do have symptoms, they may:  Vary depending on where your endometrial tissue is growing.  Occur during your menstrual period (most common) or midcycle.  Come and go, or you may go months with no symptoms at all.  Stop with menopause. Symptoms may include:  Pain in the back or abdomen.  Heavier bleeding during periods.  Pain during sex.  Painful bowel movements.  Infertility.  Pelvic pain.  Bleeding more than once a month. How is this diagnosed? This condition is diagnosed based on your symptoms and a physical exam. You may have tests, such as:  Blood tests and urine tests. These may be done to help rule out other possible causes of your symptoms.  Ultrasound, to look for abnormal tissues.  An X-ray of the lower bowel (barium enema).  An ultrasound  that is done through the vagina (transvaginally).  CT scan.  MRI.  Laparoscopy. In this procedure, a lighted, pencil-sized instrument called a laparoscope is inserted into your abdomen through an incision. The laparoscope allows your health care provider to look at the organs inside your body and check for abnormal tissue to confirm the diagnosis. If abnormal tissue is found, your health care provider may remove a small piece of tissue (biopsy) to be examined under a microscope. How is this treated? Treatment for this condition may include:  Medicines to relieve pain, such as NSAIDs.  Hormone therapy. This involves using artificial (synthetic) hormones to reduce endometrial tissue growth. Your health care provider may recommend using a hormonal form of birth control, or other medicines.  Surgery. This may be done to remove abnormal endometrial tissue.  In some cases, tissue may be removed using a laparoscope and a laser (laparoscopic laser treatment).  In severe cases, surgery may be done to remove the fallopian tubes, uterus, and ovaries (hysterectomy). Follow these instructions at home:  Take over-the-counter and prescription medicines only as told by your health care provider.  Do not drive or use heavy machinery while taking prescription pain medicine.  Try to avoid activities that cause pain, including sexual activity.  Keep all follow-up visits as told by your health care provider. This is important. Contact a health care provider if:  You have pain in the area between your hip bones (pelvic area) that occurs:  Before, during, or after your period.  In   between your period and gets worse during your period.  During or after sex.  With bowel movements or urination, especially during your period.  You have problems getting pregnant.  You have a fever. Get help right away if:  You have severe pain that does not get better with medicine.  You have severe nausea and  vomiting, or you cannot eat without vomiting.  You have pain that affects only the lower, right side of your abdomen.  You have abdominal pain that gets worse.  You have abdominal swelling.  You have blood in your stool. This information is not intended to replace advice given to you by your health care provider. Make sure you discuss any questions you have with your health care provider. Document Released: 02/01/2000 Document Revised: 11/09/2015 Document Reviewed: 07/07/2015 Elsevier Interactive Patient Education  2017 Elsevier Inc.  

## 2016-01-24 NOTE — MAU Note (Signed)
Pt reports she has been having sharp pain in her lower pelvis in the side for a few days. Feeling nauseated and having bad headache as well. Not taken anything for the pain.
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
## 2016-01-25 ENCOUNTER — Other Ambulatory Visit: Payer: Self-pay

## 2016-01-25 ENCOUNTER — Encounter: Payer: Self-pay | Admitting: Physician Assistant

## 2016-01-25 ENCOUNTER — Other Ambulatory Visit (INDEPENDENT_AMBULATORY_CARE_PROVIDER_SITE_OTHER): Payer: Medicaid Other

## 2016-01-25 ENCOUNTER — Ambulatory Visit (INDEPENDENT_AMBULATORY_CARE_PROVIDER_SITE_OTHER): Payer: Medicaid Other | Admitting: Physician Assistant

## 2016-01-25 VITALS — BP 102/66 | HR 68 | Ht 63.0 in | Wt 113.4 lb

## 2016-01-25 DIAGNOSIS — F5000 Anorexia nervosa, unspecified: Secondary | ICD-10-CM

## 2016-01-25 DIAGNOSIS — K219 Gastro-esophageal reflux disease without esophagitis: Secondary | ICD-10-CM

## 2016-01-25 DIAGNOSIS — R140 Abdominal distension (gaseous): Secondary | ICD-10-CM

## 2016-01-25 DIAGNOSIS — R1013 Epigastric pain: Secondary | ICD-10-CM

## 2016-01-25 DIAGNOSIS — R630 Anorexia: Secondary | ICD-10-CM

## 2016-01-25 DIAGNOSIS — K5909 Other constipation: Secondary | ICD-10-CM

## 2016-01-25 DIAGNOSIS — R17 Unspecified jaundice: Secondary | ICD-10-CM

## 2016-01-25 DIAGNOSIS — R1084 Generalized abdominal pain: Secondary | ICD-10-CM

## 2016-01-25 LAB — COMPREHENSIVE METABOLIC PANEL
ALBUMIN: 5.2 g/dL (ref 3.5–5.2)
ALT: 10 U/L (ref 0–35)
AST: 13 U/L (ref 0–37)
Alkaline Phosphatase: 44 U/L (ref 39–117)
BILIRUBIN TOTAL: 1.4 mg/dL — AB (ref 0.2–1.2)
BUN: 16 mg/dL (ref 6–23)
CALCIUM: 10 mg/dL (ref 8.4–10.5)
CHLORIDE: 100 meq/L (ref 96–112)
CO2: 26 mEq/L (ref 19–32)
CREATININE: 0.73 mg/dL (ref 0.40–1.20)
GFR: 118.86 mL/min (ref >60.00)
Glucose, Bld: 70 mg/dL (ref 70–99)
Potassium: 3.8 mEq/L (ref 3.5–5.1)
SODIUM: 138 meq/L (ref 135–145)
Total Protein: 8.4 g/dL — ABNORMAL HIGH (ref 6.0–8.3)

## 2016-01-25 LAB — CBC WITH DIFFERENTIAL/PLATELET
BASOS ABS: 0 10*3/uL (ref 0.0–0.1)
BASOS PCT: 0.2 % (ref 0.0–3.0)
EOS ABS: 0 10*3/uL (ref 0.0–0.7)
Eosinophils Relative: 0 % (ref 0.0–5.0)
HEMATOCRIT: 42.4 % (ref 36.0–46.0)
Hemoglobin: 15.1 g/dL — ABNORMAL HIGH (ref 12.0–15.0)
LYMPHS PCT: 17.7 % (ref 12.0–46.0)
Lymphs Abs: 1.4 10*3/uL (ref 0.7–4.0)
MCHC: 35.6 g/dL (ref 30.0–36.0)
MCV: 90 fl (ref 78.0–100.0)
MONO ABS: 0.3 10*3/uL (ref 0.1–1.0)
Monocytes Relative: 3.5 % (ref 3.0–12.0)
Neutro Abs: 6.1 10*3/uL (ref 1.4–7.7)
Neutrophils Relative %: 78.6 % — ABNORMAL HIGH (ref 43.0–77.0)
Platelets: 324 10*3/uL (ref 150.0–400.0)
RBC: 4.71 Mil/uL (ref 3.87–5.11)
RDW: 14.1 % (ref 11.5–15.5)
WBC: 7.7 10*3/uL (ref 4.0–10.5)

## 2016-01-25 LAB — LIPASE: LIPASE: 6 U/L — AB (ref 11.0–59.0)

## 2016-01-25 LAB — GC/CHLAMYDIA PROBE AMP (~~LOC~~) NOT AT ARMC
CHLAMYDIA, DNA PROBE: NEGATIVE
NEISSERIA GONORRHEA: NEGATIVE

## 2016-01-25 LAB — H. PYLORI ANTIBODY, IGG: H Pylori IgG: NEGATIVE

## 2016-01-25 MED ORDER — OMEPRAZOLE 40 MG PO CPDR: 40.0000 mg | DELAYED_RELEASE_CAPSULE | Freq: Every day | ORAL | 3 refills | Status: DC
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
## 2016-01-25 NOTE — Patient Instructions (Signed)
We sent a prescription for Omeprazole 40 mg to your pharmacy. We have given you a coupon for Align probiotic. You can get this at your pharmacy, [facility], [employer], [employer], [employer].  We have given you several handouts of information. 1. Anti-reflux  2. High Fiber diet.   Take Miralax up to 4 times a day for constipation.   Helicobacter Pylori Infection Introduction Helicobacter pylori infection is an infection in the stomach that is caused by the Helicobacter pylori (H. pylori) bacteria. This type of bacteria often lives in the lining of the stomach. The infection can cause ulcers and irritation (gastritis) in some people. It is the most common cause of ulcers in the stomach (gastric ulcer) and in the upper part of the intestine (duodenal ulcer). Having this infection may also increase the risk of stomach cancer and a type of white blood cell cancer (lymphoma) that affects the stomach. What are the causes? H. pylori is a type of bacteria that is often found in the stomachs of healthy people. The bacteria may be passed from person to person through contact with stool or saliva. It is not known why some people develop ulcers, gastritis, or cancer from the infection. What increases the risk? This condition is more likely to develop in people who:  Have family members with the infection.  Live with many other people, such as in a dormitory.  Are of African, Hispanic, or Asian descent. What are the signs or symptoms? Most people with this infection do not have symptoms. If you do have symptoms, they may include:  Heartburn.  Stomach pain.  Nausea.  Vomiting.  Blood-tinged vomit.  Loss of appetite.  Bad breath. How is this diagnosed? This condition may be diagnosed based on your symptoms, a physical exam, and various tests. Tests may include:  Blood tests or stool tests to check for the proteins (antibodies) that your body may produce in response to the bacteria. These tests are  the best way to confirm the diagnosis.  A breath test to check for the type of gas that the H. pylori bacteria release after breaking down a substance called urea. For the test, you are asked to drink urea. This test is often done after treatment in order to find out if the treatment worked.  A procedure in which a thin, flexible tube with a tiny camera at the end is placed into your stomach and upper intestine (upper endoscopy). Your health care provider may also take tissue samples (biopsy) to test for H. pylori and cancer. How is this treated? Treatment for this condition usually involves taking a combination of medicines (triple therapy) for a couple of weeks. Triple therapy includes one medicine to reduce the acid in your stomach and two types of antibiotic medicines. Many drug combinations have been approved for treatment. Treatment usually kills the H. pylori and reduces your risk of cancer. You may need to be tested for H. pylori again after treatment. In some cases, the treatment may need to be repeated. Follow these instructions at home:  Take over-the-counter and prescription medicines only as told by your health care provider.  Take your antibiotics as told by your health care provider. Do not stop taking the antibiotics even if you start to feel better.  You can do all your usual activities and eat what you usually do.  Take steps to prevent future infections:  Wash your hands often.  Make sure the food you eat has been properly prepared.  Drink  water only from clean sources.  Keep all follow-up visits as told by your health care provider. This is important. Contact a health care provider if:  Your symptoms do not get better.  Your symptoms return after treatment. This information is not intended to replace advice given to you by your health care provider. Make sure you discuss any questions you have with your health care provider. Document Released: 05/28/2015 Document  Revised: 07/12/2015 Document Reviewed: 02/15/2014  2017 Elsevier

## 2016-03-04 ENCOUNTER — Ambulatory Visit (INDEPENDENT_AMBULATORY_CARE_PROVIDER_SITE_OTHER): Payer: Medicaid Other | Admitting: Gastroenterology

## 2016-03-04 ENCOUNTER — Encounter: Payer: Self-pay | Admitting: Gastroenterology

## 2016-03-04 ENCOUNTER — Encounter (INDEPENDENT_AMBULATORY_CARE_PROVIDER_SITE_OTHER): Payer: Self-pay

## 2016-03-04 VITALS — BP 90/62 | HR 104 | Ht 62.6 in | Wt 122.0 lb

## 2016-03-04 DIAGNOSIS — R1032 Left lower quadrant pain: Secondary | ICD-10-CM

## 2016-03-04 DIAGNOSIS — R194 Change in bowel habit: Secondary | ICD-10-CM

## 2016-03-04 DIAGNOSIS — K219 Gastro-esophageal reflux disease without esophagitis: Secondary | ICD-10-CM

## 2016-03-04 DIAGNOSIS — R140 Abdominal distension (gaseous): Secondary | ICD-10-CM

## 2016-03-04 NOTE — Progress Notes (Addendum)
[facility] Progress Note  Chief Complaint: Heartburn and abdominal pain altered bowel habits  Subjective  History:  This is a 31 year old woman recently seen by one of our PAs for a multitude of GI symptoms. Her heartburn and dyspepsia have improved on once daily PPI. She is a somewhat tangential and limited historian, but seems to had much symptom improvement with a change in diet. She still has difficulty tolerating certain foods, including all meats. Her constipation is apparently improved, now sometimes has one or 2 loose stools per day. Has generalized abdominal pain that seems worse on the left. It is nonradiating with no clear triggers such as eating time of day position or bowel movements. She denies rectal bleeding  ROS: Cardiovascular:  no chest pain Respiratory: no dyspnea  The patient's Past Medical, Family and Social History were reviewed and are on file in the EMR.  Objective:  Med list reviewed  Vital signs in last 24 hrs: Vitals:   03/04/16 1115  BP: 90/62  Pulse: (!) 104    Physical Exam    HEENT: sclera anicteric, oral mucosa moist without lesions  Neck: supple, no thyromegaly, JVD or lymphadenopathy  Cardiac: RRR without murmurs, S1S2 heard, no peripheral edema  Pulm: clear to auscultation bilaterally, normal RR and effort noted  Abdomen: soft, Mild left sided tenderness, with active bowel sounds. No guarding or palpable hepatosplenomegaly.  Skin; warm and dry, no jaundice or rash  Recent Labs:  H pylori IgG negative  She has had no abdominal imaging   @ASSESSMENTPLANBEGIN @ Assessment: Encounter Diagnoses  Name Primary?  Marland Kitchen LLQ pain Yes  . Altered bowel habits   . Gastroesophageal reflux disease, esophagitis presence not specified   . Abdominal bloating    Overall, this seems most likely a functional bowel disorder.   Plan:  CT scan abdomen and pelvis to rule out an inflammatory process such as IBD. (after pregnancy test done  today) If negative, continued attention to healthy diet and lifestyle and as needed use of antispasmodic therapy.  Total time 25 minutes, over half spent in counseling and coordination of care.   Nelida Meuse III

## 2016-03-04 NOTE — Patient Instructions (Signed)
If you are age 33 or older, your body mass index should be between 23-30. Your Body mass index is 21.89 kg/m. If this is out of the aforementioned range listed, please consider follow up with your Primary Care Provider.  If you are age 83 or younger, your body mass index should be between 19-25. Your Body mass index is 21.89 kg/m. If this is out of the aformentioned range listed, please consider follow up with your Primary Care Provider.   You have been scheduled for a CT scan of the abdomen and pelvis at [facility] ([address]---this is in the same building as Press [facility]).   You are scheduled on 03-10-2016 at 2pm. You should arrive 15 minutes prior to your appointment time for registration. Please follow the written instructions below on the day of your exam:  WARNING: IF YOU ARE ALLERGIC TO IODINE/X-RAY DYE, PLEASE NOTIFY RADIOLOGY IMMEDIATELY AT [phone]! YOU WILL BE GIVEN A 13 HOUR PREMEDICATION PREP.  1) Do not eat or drink anything after 10am (4 hours prior to your test) 2) You have been given 2 bottles of oral contrast to drink. The solution may taste               better if refrigerated, but do NOT add ice or any other liquid to this solution. Shake             well before drinking.    Drink 1 bottle of contrast @ 12/noon (2 hours prior to your exam)  Drink 1 bottle of contrast @ 1pm (1 hour prior to your exam)  You may take any medications as prescribed with a small amount of water except for the following: Metformin, Glucophage, Glucovance, Avandamet, Riomet, Fortamet, Actoplus Met, Janumet, Glumetza or Metaglip. The above medications must be held the day of the exam AND 48 hours after the exam.  The purpose of you drinking the oral contrast is to aid in the visualization of your intestinal tract. The contrast solution may cause some diarrhea. Before your exam is started, you will be given a small amount of fluid to drink. Depending on your individual set  of symptoms, you may also receive an intravenous injection of x-ray contrast/dye. Plan on being at [facility] for 30 minutes or longer, depending on the type of exam you are having performed.  This test typically takes 30-45 minutes to complete.  If you have any questions regarding your exam or if you need to reschedule, you may call the CT department at [phone] between the hours of 8:00 am and 5:00 pm, Monday-Friday.  ________________________________________________________________________  Thank you for choosing [facility]  Dr Wilfrid Lund III

## 2016-03-07 ENCOUNTER — Other Ambulatory Visit: Payer: Medicaid Other

## 2016-03-07 DIAGNOSIS — K219 Gastro-esophageal reflux disease without esophagitis: Secondary | ICD-10-CM

## 2016-03-07 DIAGNOSIS — R140 Abdominal distension (gaseous): Secondary | ICD-10-CM

## 2016-03-07 DIAGNOSIS — R194 Change in bowel habit: Secondary | ICD-10-CM

## 2016-03-07 DIAGNOSIS — R1032 Left lower quadrant pain: Secondary | ICD-10-CM

## 2016-03-08 LAB — PREGNANCY, URINE: PREG TEST UR: NEGATIVE

## 2016-03-10 ENCOUNTER — Ambulatory Visit (INDEPENDENT_AMBULATORY_CARE_PROVIDER_SITE_OTHER)
Admission: RE | Admit: 2016-03-10 | Discharge: 2016-03-10 | Disposition: A | Discharge status: Home / Self Care | Payer: Medicaid Other | Source: Ambulatory Visit | Attending: Gastroenterology | Admitting: Gastroenterology

## 2016-03-10 DIAGNOSIS — K219 Gastro-esophageal reflux disease without esophagitis: Secondary | ICD-10-CM

## 2016-03-10 DIAGNOSIS — R1032 Left lower quadrant pain: Secondary | ICD-10-CM | POA: Diagnosis not present

## 2016-03-10 DIAGNOSIS — R194 Change in bowel habit: Secondary | ICD-10-CM

## 2016-03-10 DIAGNOSIS — R140 Abdominal distension (gaseous): Secondary | ICD-10-CM

## 2016-03-10 IMAGING — CT CT ABD-PELV W/ CM
2 of 4 series · 16 of 46 positions shown, 18 images · IV contrast (iopamidol)
Comparison: [DATE] pelvic ultrasound

CLINICAL DATA: Worsening abdominal pain with bloating and nausea
after eating. Constipation. Left lower quadrant tenderness with
palpation. History of endometriosis.

EXAM:
CT ABDOMEN AND PELVIS WITH CONTRAST
TECHNIQUE: Multidetector CT imaging of the abdomen and pelvis was performed
using the standard protocol following bolus administration of
intravenous contrast.
CONTRAST:  100mL [DC] IOPAMIDOL ([DC]) INJECTION 61%

[Series 2: abd/pel w · axial · 0.63mm/px · z∈[-444,-90]mm · 13 of 79 slices shown, 15 images]
[im 4/79  soft-tissue]
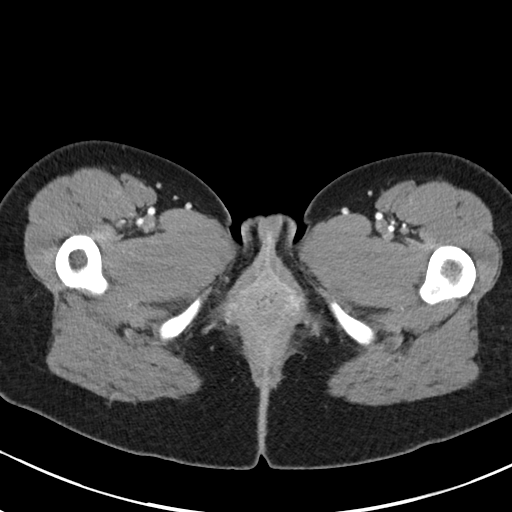
[im 4/79  bone]
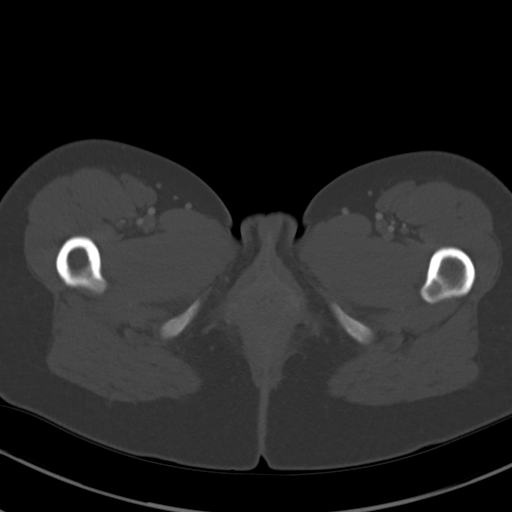
[im 10/79  soft-tissue]
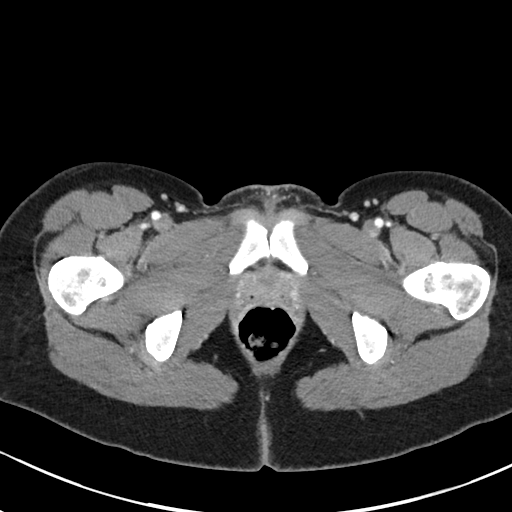
[im 17/79  soft-tissue]
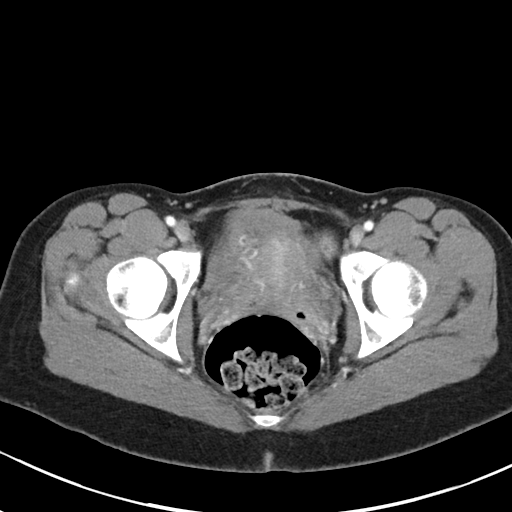
[im 23/79  soft-tissue]
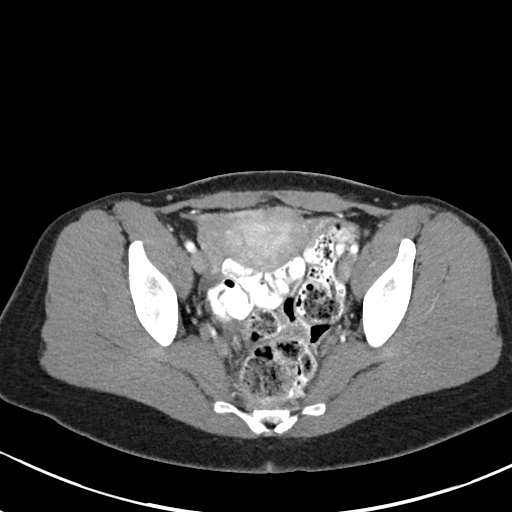
[im 27/79  soft-tissue]
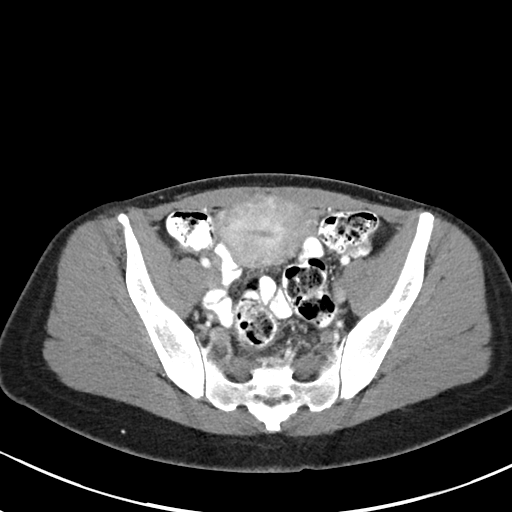
[im 33/79  soft-tissue]
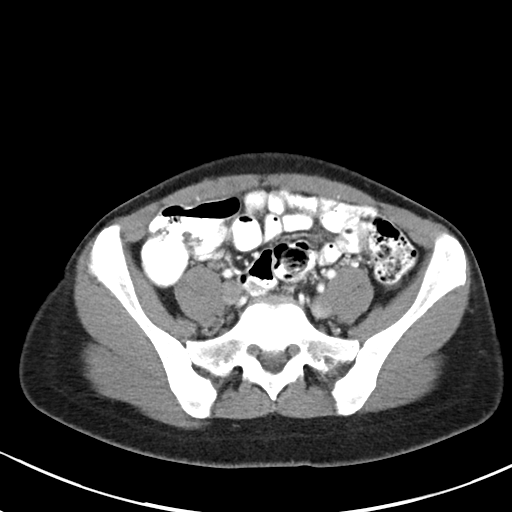
[im 40/79  soft-tissue]
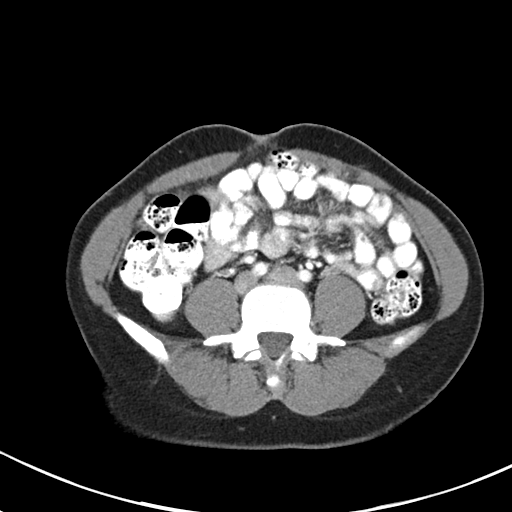
[im 46/79  soft-tissue]
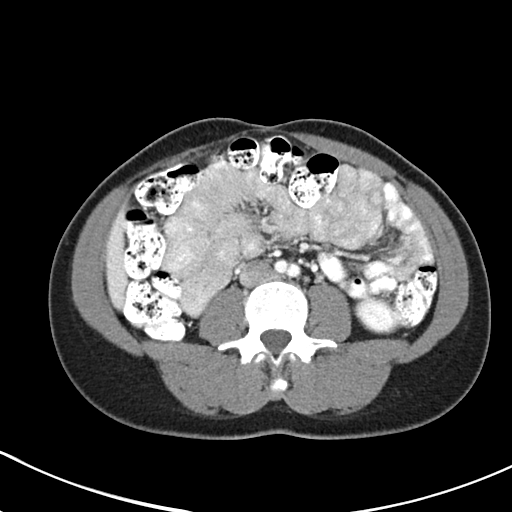
[im 53/79  soft-tissue]
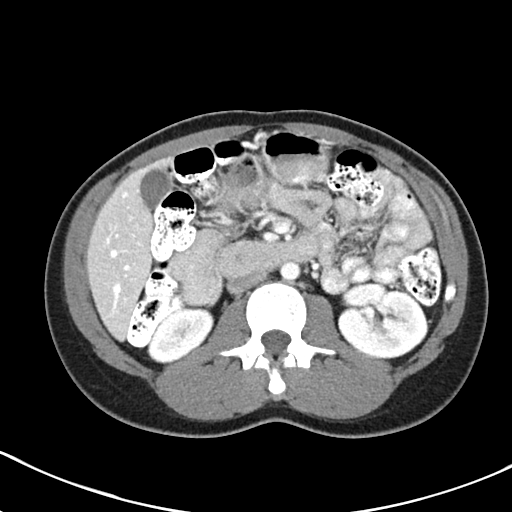
[im 53/79  bone]
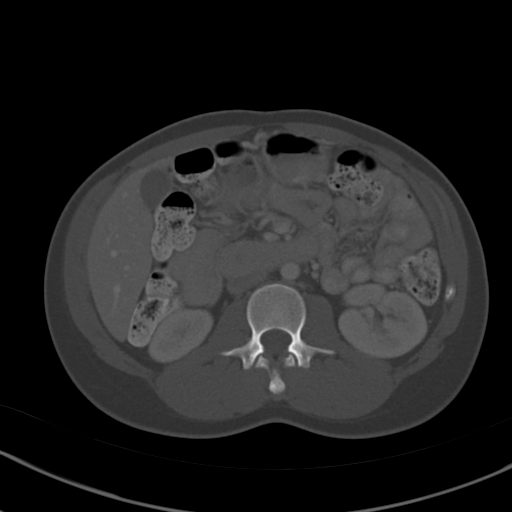
[im 56/79  soft-tissue]
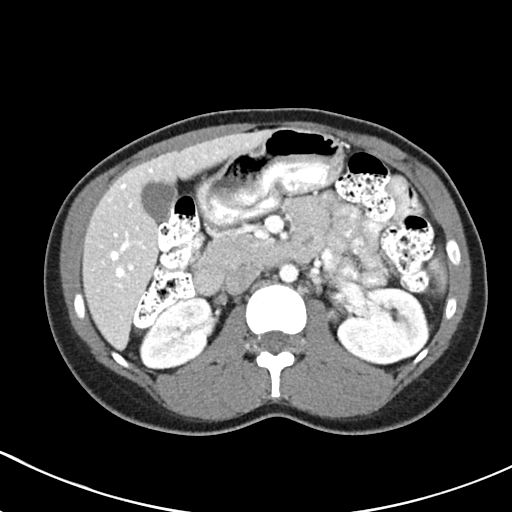
[im 62/79  soft-tissue]
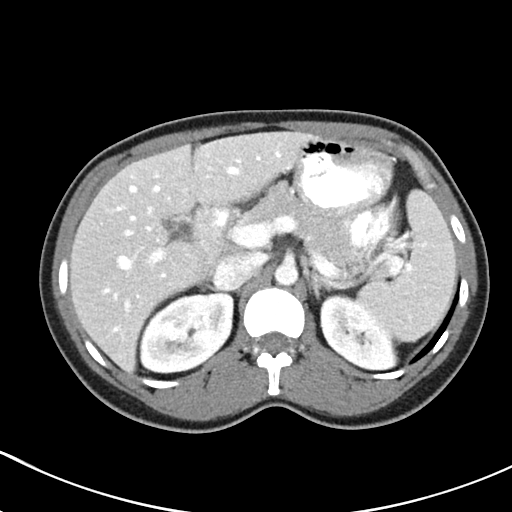
[im 69/79  soft-tissue]
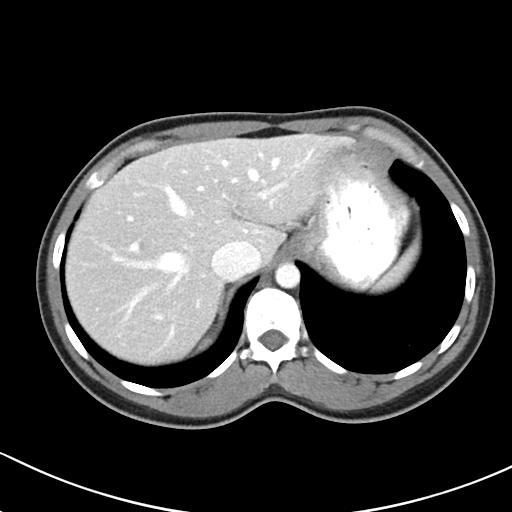
[im 75/79  soft-tissue]
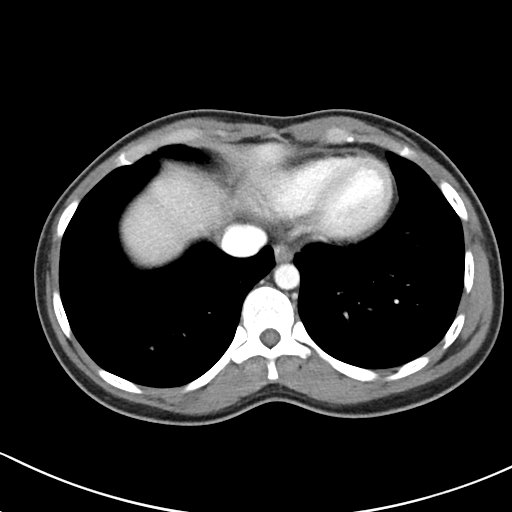

[Series 5: abd/pel w st · coronal · 0.62mm/px · 3 of 63 slices shown]
[im 21/63  soft-tissue]
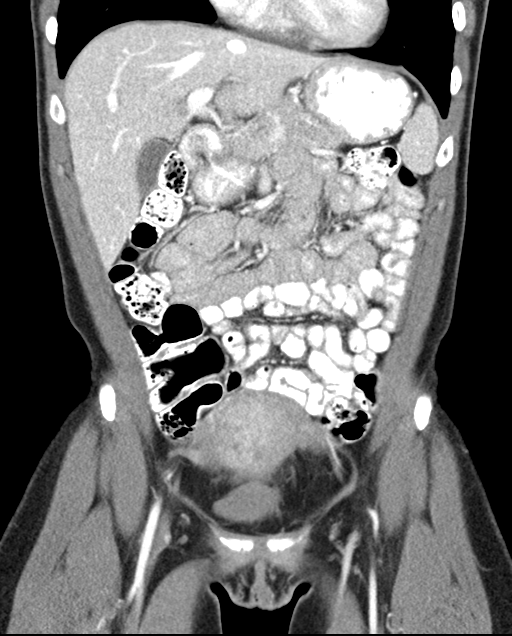
[im 28/63  soft-tissue]
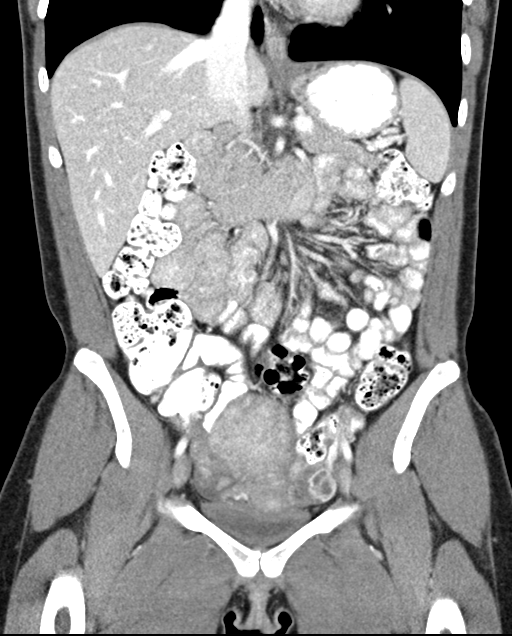
[im 35/63  soft-tissue]
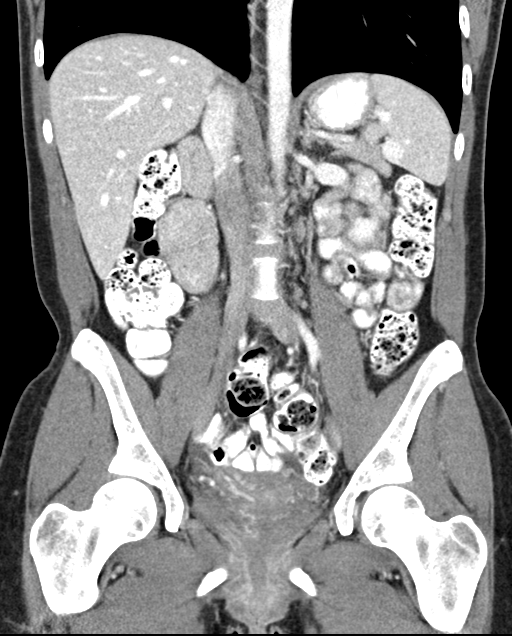

[16 of 46 positions shown; findings below may reference images not displayed]

FINDINGS: Lower chest: Mild bandlike scarring or atelectasis anteriorly in the
right middle lobe on image [DATE].

Hepatobiliary: Unremarkable

Pancreas: Unremarkable

Spleen: Unremarkable

Adrenals/Urinary Tract: Unremarkable

Stomach/Bowel: Prominent stool throughout the colon favors
constipation. Appendix normal.

Vascular/Lymphatic: Unremarkable

Reproductive: Slightly unusual relationship of the anterior uterine
wall to the anterior abdominal wall on image 46 of series 6, raising
the possibility of an adhesion. Small cysts or follicles in both
ovaries with a collapsed cyst or corpus luteum in the left ovary
measuring 1.8 by 1.0 cm on image 60/2.

Other: Mild indistinctness of tissue planes in the cul-de-sac such
as on images 55-56 of series 2, possibly a manifestation of
endometriosis but technically nonspecific.

Musculoskeletal: Unremarkable
IMPRESSION: 1. Infiltration of the adipose tissue along the cul-de-sac region,
as well as possible adhesion or scarring along the anterior uterine
wall adjacent to the anterior pelvic wall musculature, both could be
from endometriosis better technically nonspecific.
2.  Prominent stool throughout the colon favors constipation.

## 2016-03-10 MED ORDER — IOPAMIDOL (ISOVUE-300) INJECTION 61%
100.0000 mL | Freq: Once | INTRAVENOUS | Status: AC | PRN
  Administered 2016-03-10: 100 mL via INTRAVENOUS

## 2016-04-03 ENCOUNTER — Encounter (HOSPITAL_COMMUNITY): Payer: Self-pay

## 2016-06-30 ENCOUNTER — Encounter: Payer: Self-pay | Admitting: *Deleted

## 2016-06-30 ENCOUNTER — Ambulatory Visit (INDEPENDENT_AMBULATORY_CARE_PROVIDER_SITE_OTHER): Payer: Medicaid Other | Admitting: *Deleted

## 2016-06-30 DIAGNOSIS — Z3201 Encounter for pregnancy test, result positive: Secondary | ICD-10-CM

## 2016-06-30 DIAGNOSIS — Z01419 Encounter for gynecological examination (general) (routine) without abnormal findings: Secondary | ICD-10-CM

## 2016-06-30 DIAGNOSIS — N912 Amenorrhea, unspecified: Secondary | ICD-10-CM

## 2016-06-30 LAB — POCT URINE PREGNANCY: Preg Test, Ur: POSITIVE — AB

## 2016-06-30 MED ORDER — VITAFOL GUMMIES 3.33-0.333-34.8 MG PO CHEW: 3.0000 | CHEWABLE_TABLET | Freq: Every day | ORAL | 12 refills | Status: DC

## 2016-06-30 NOTE — Progress Notes (Signed)
Patient had positive home test. LMP 05/25/2016- spotting- not normal cycle EDD 03/01/17    [redacted]w[redacted]d

## 2016-07-09 ENCOUNTER — Inpatient Hospital Stay (HOSPITAL_COMMUNITY): Payer: Medicaid Other

## 2016-07-09 ENCOUNTER — Inpatient Hospital Stay (HOSPITAL_COMMUNITY)
Admission: AD | Admit: 2016-07-09 | Discharge: 2016-07-09 | Disposition: A | Discharge status: Home / Self Care | Payer: Medicaid Other | Source: Ambulatory Visit | Attending: Family Medicine | Admitting: Family Medicine

## 2016-07-09 ENCOUNTER — Encounter (HOSPITAL_COMMUNITY): Payer: Self-pay | Admitting: *Deleted

## 2016-07-09 DIAGNOSIS — O208 Other hemorrhage in early pregnancy: Secondary | ICD-10-CM | POA: Insufficient documentation

## 2016-07-09 DIAGNOSIS — A5901 Trichomonal vulvovaginitis: Secondary | ICD-10-CM | POA: Insufficient documentation

## 2016-07-09 DIAGNOSIS — N8311 Corpus luteum cyst of right ovary: Secondary | ICD-10-CM | POA: Insufficient documentation

## 2016-07-09 DIAGNOSIS — Z885 Allergy status to narcotic agent status: Secondary | ICD-10-CM | POA: Diagnosis not present

## 2016-07-09 DIAGNOSIS — Z8042 Family history of malignant neoplasm of prostate: Secondary | ICD-10-CM | POA: Insufficient documentation

## 2016-07-09 DIAGNOSIS — Z803 Family history of malignant neoplasm of breast: Secondary | ICD-10-CM | POA: Insufficient documentation

## 2016-07-09 DIAGNOSIS — A599 Trichomoniasis, unspecified: Secondary | ICD-10-CM

## 2016-07-09 DIAGNOSIS — O23591 Infection of other part of genital tract in pregnancy, first trimester: Secondary | ICD-10-CM | POA: Insufficient documentation

## 2016-07-09 DIAGNOSIS — Z9889 Other specified postprocedural states: Secondary | ICD-10-CM | POA: Insufficient documentation

## 2016-07-09 DIAGNOSIS — O200 Threatened abortion: Secondary | ICD-10-CM | POA: Diagnosis not present

## 2016-07-09 DIAGNOSIS — B9689 Other specified bacterial agents as the cause of diseases classified elsewhere: Secondary | ICD-10-CM | POA: Insufficient documentation

## 2016-07-09 DIAGNOSIS — Z3A01 Less than 8 weeks gestation of pregnancy: Secondary | ICD-10-CM | POA: Diagnosis not present

## 2016-07-09 DIAGNOSIS — O3481 Maternal care for other abnormalities of pelvic organs, first trimester: Secondary | ICD-10-CM | POA: Insufficient documentation

## 2016-07-09 DIAGNOSIS — Z87891 Personal history of nicotine dependence: Secondary | ICD-10-CM | POA: Diagnosis not present

## 2016-07-09 DIAGNOSIS — Z8544 Personal history of malignant neoplasm of other female genital organs: Secondary | ICD-10-CM | POA: Insufficient documentation

## 2016-07-09 DIAGNOSIS — Z3201 Encounter for pregnancy test, result positive: Secondary | ICD-10-CM | POA: Insufficient documentation

## 2016-07-09 DIAGNOSIS — O418X10 Other specified disorders of amniotic fluid and membranes, first trimester, not applicable or unspecified: Secondary | ICD-10-CM

## 2016-07-09 DIAGNOSIS — O209 Hemorrhage in early pregnancy, unspecified: Secondary | ICD-10-CM

## 2016-07-09 DIAGNOSIS — O9989 Other specified diseases and conditions complicating pregnancy, childbirth and the puerperium: Secondary | ICD-10-CM | POA: Diagnosis not present

## 2016-07-09 DIAGNOSIS — O468X1 Other antepartum hemorrhage, first trimester: Secondary | ICD-10-CM

## 2016-07-09 LAB — URINALYSIS, ROUTINE W REFLEX MICROSCOPIC
BILIRUBIN URINE: NEGATIVE
Bacteria, UA: NONE SEEN
GLUCOSE, UA: NEGATIVE mg/dL
KETONES UR: 5 mg/dL — AB
NITRITE: NEGATIVE
PH: 7 (ref 5.0–8.0)
Protein, ur: NEGATIVE mg/dL
Specific Gravity, Urine: 1.02 (ref 1.005–1.030)

## 2016-07-09 LAB — CBC
HCT: 43.1 % (ref 36.0–46.0)
Hemoglobin: 15.5 g/dL — ABNORMAL HIGH (ref 12.0–15.0)
MCH: 32.9 pg (ref 26.0–34.0)
MCHC: 36 g/dL (ref 30.0–36.0)
MCV: 91.5 fL (ref 78.0–100.0)
PLATELETS: 248 10*3/uL (ref 150–400)
RBC: 4.71 MIL/uL (ref 3.87–5.11)
RDW: 13.5 % (ref 11.5–15.5)
WBC: 7 10*3/uL (ref 4.0–10.5)

## 2016-07-09 LAB — WET PREP, GENITAL
Sperm: NONE SEEN
YEAST WET PREP: NONE SEEN

## 2016-07-09 LAB — ABO/RH: ABO/RH(D): O POS

## 2016-07-09 LAB — HCG, QUANTITATIVE, PREGNANCY: hCG, Beta Chain, Quant, S: 43402 m[IU]/mL — ABNORMAL HIGH (ref <5)

## 2016-07-09 LAB — POCT PREGNANCY, URINE: Preg Test, Ur: POSITIVE — AB

## 2016-07-09 IMAGING — US US OB COMP LESS 14 WK
1 series · 15 of 28 positions shown · non-contrast
Comparison: None.

CLINICAL DATA: Pregnant, for viability

EXAM:
OBSTETRIC <14 WK US AND TRANSVAGINAL OB US
TECHNIQUE: Both transabdominal and transvaginal ultrasound examinations were
performed for complete evaluation of the gestation as well as the
maternal uterus, adnexal regions, and pelvic cul-de-sac.
Transvaginal technique was performed to assess early pregnancy.

[Series 1: us ob comp less 14 wk · 79 acquisitions, 15 frames shown]
[im 1/79]
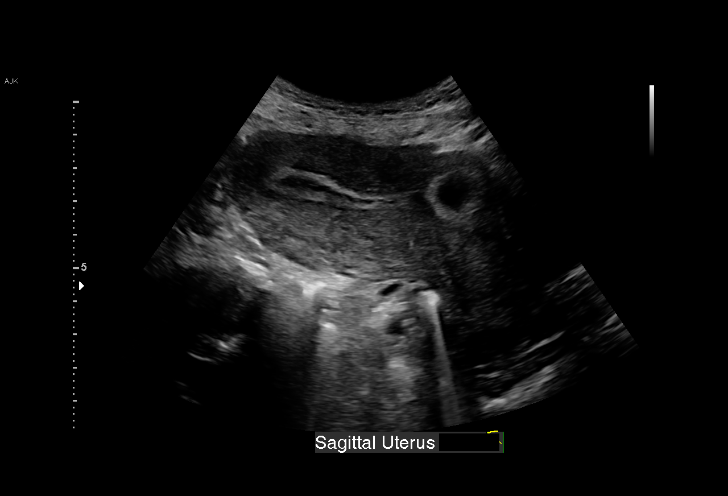
[im 6/79]
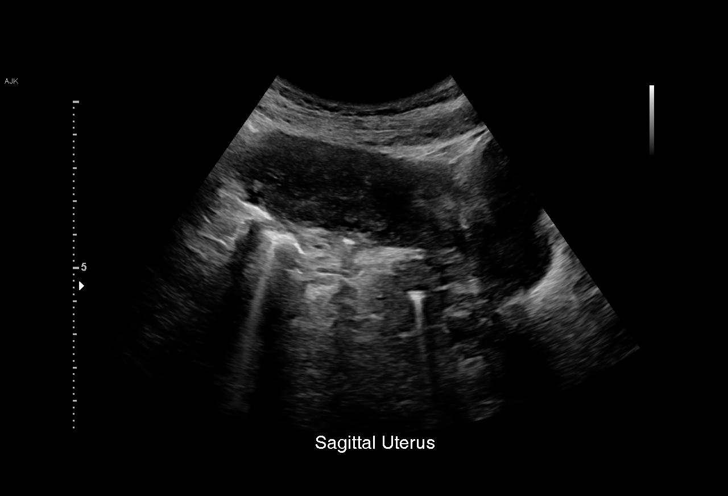
[im 12/79]
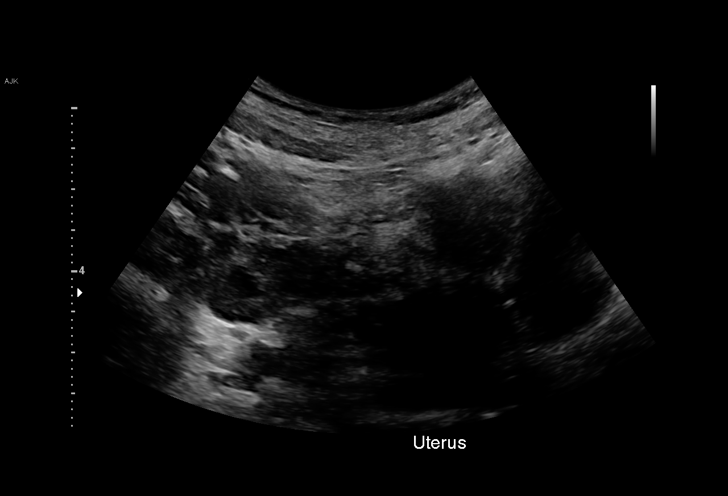
[im 18/79]
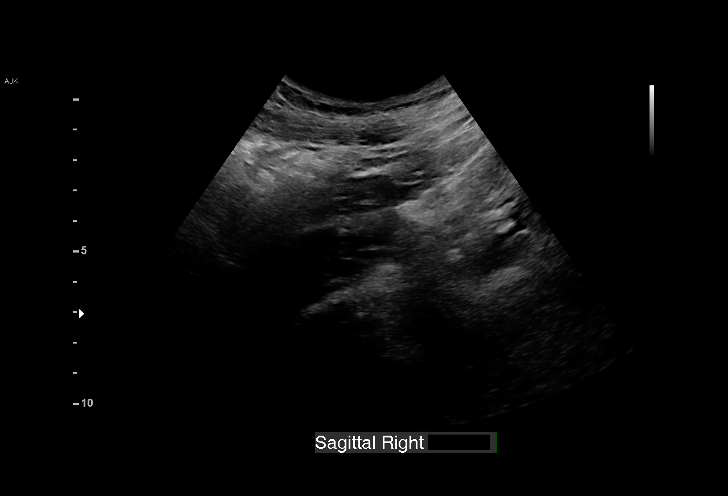
[im 24/79]
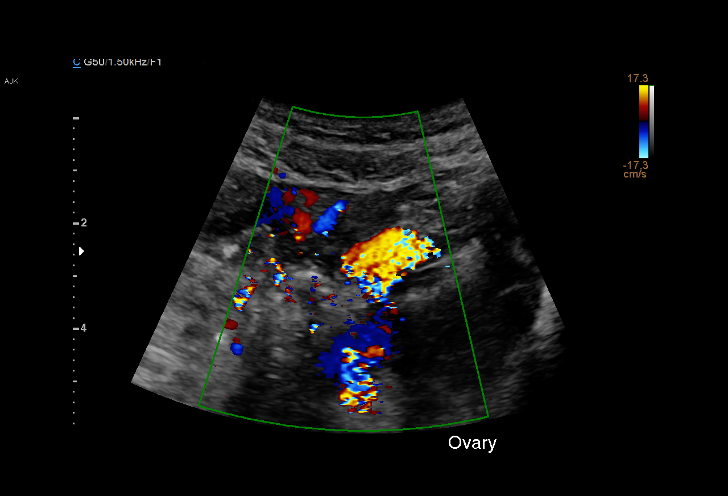
[im 29/79]
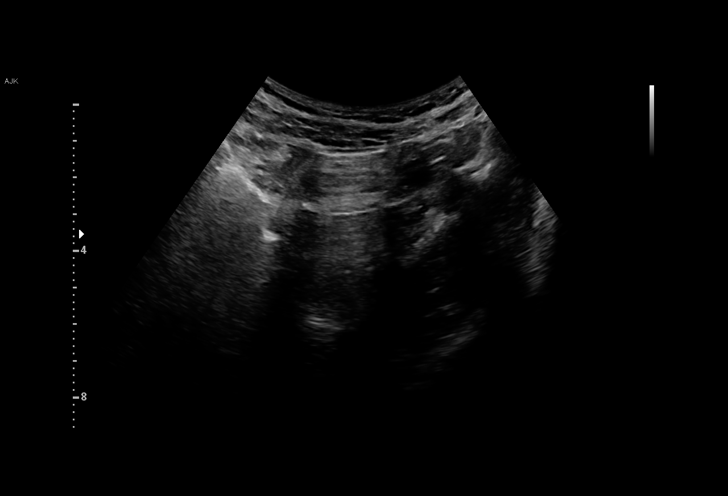
[im 35/79]
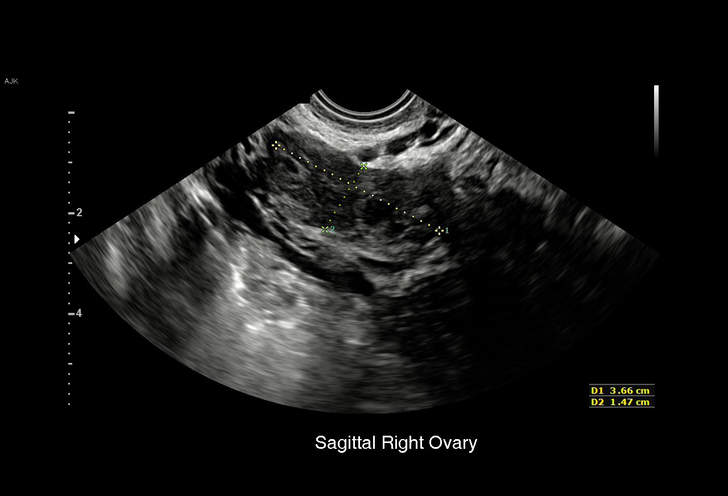
[im 41/79]
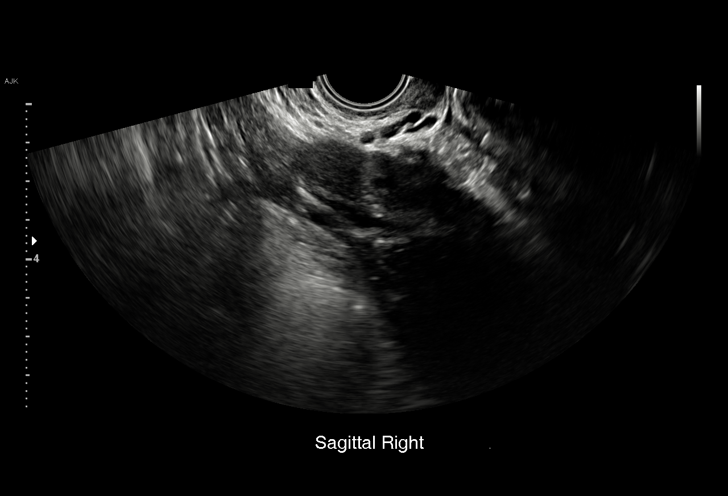
[im 44/79]
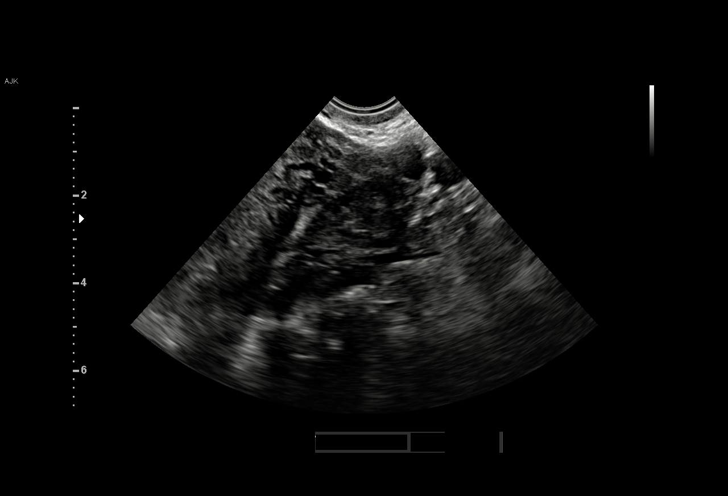
[im 50/79]
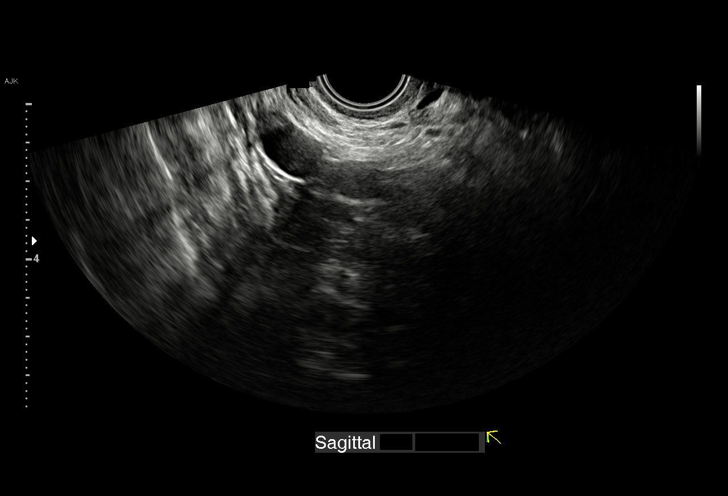
[im 55/79]
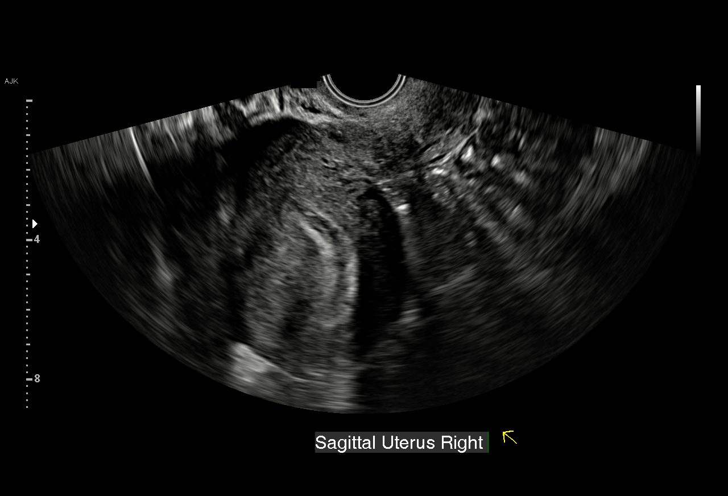
[im 61/79]
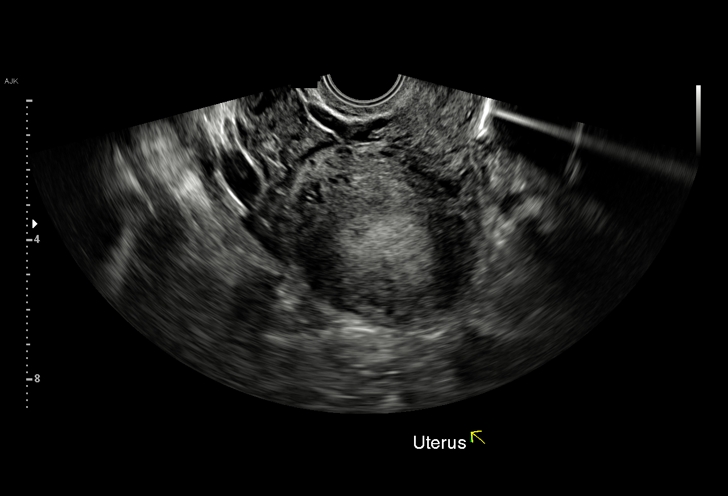
[im 67/79]
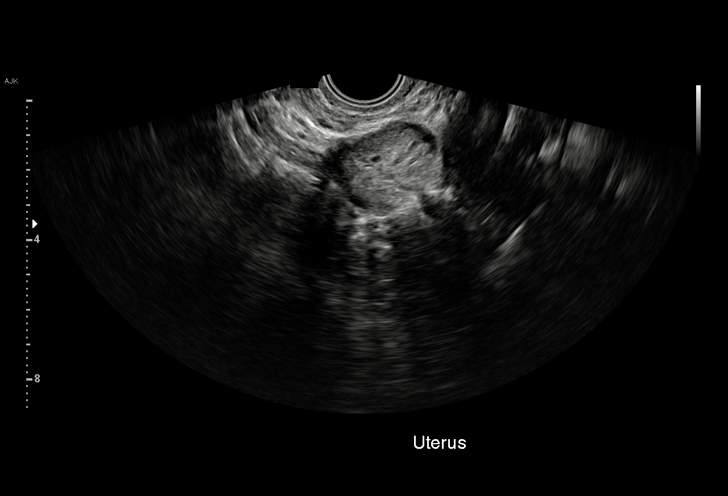
[im 73/79]
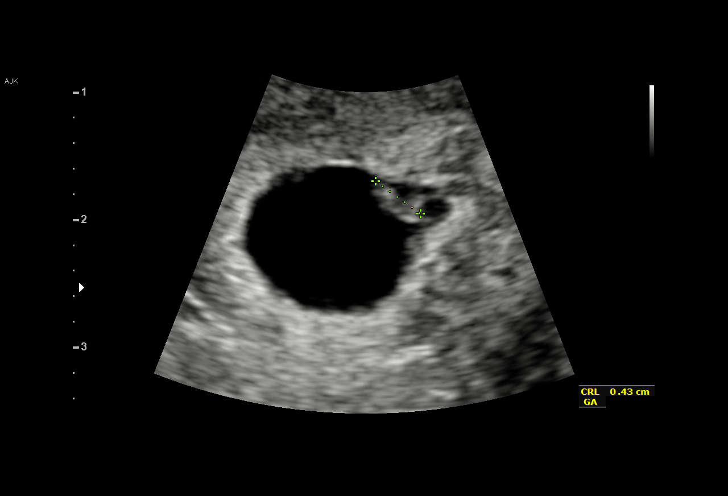
[im 79/79]
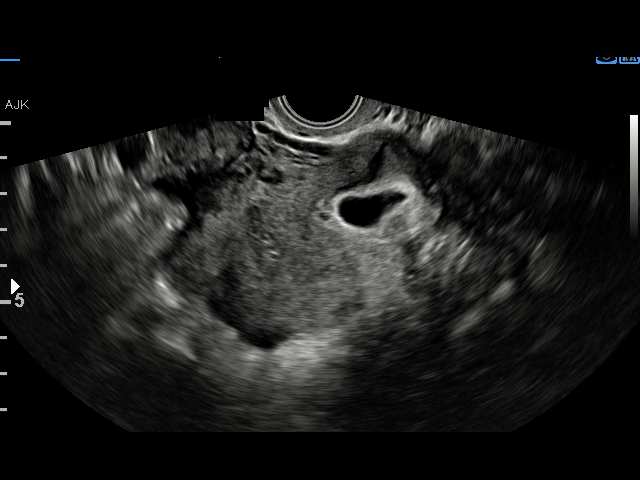

[15 of 28 positions shown; findings below may reference images not displayed]

FINDINGS: Intrauterine gestational sac: Single, located in the lower uterine
body

Yolk sac:  Visualized.

Embryo:  Visualized.

Cardiac Activity: Visualized.

Heart Rate: 104  bpm

CRL:  4.4  mm   6 w   1 d                  US EDC: [DATE]

Subchorionic hemorrhage:  Small subchronic hemorrhage.

Maternal uterus/adnexae: Bilateral ovaries are within normal limits,
noting a right corpus luteal cyst.

No free fluid.
IMPRESSION: Single live intrauterine gestation, with estimated gestational age 6
weeks 1 day by crown-rump length.

Mildly low-lying position of the gestational sac within the lower
uterine body. Consider follow-up pelvic ultrasound in 14 days.

## 2016-07-09 MED ORDER — METRONIDAZOLE 500 MG PO TABS
2000.0000 mg | ORAL_TABLET | Freq: Once | ORAL | Status: AC
  Administered 2016-07-09: 2000 mg via ORAL
  Filled 2016-07-09: qty 4

## 2016-07-09 MED ORDER — ONDANSETRON 4 MG PO TBDP
4.0000 mg | ORAL_TABLET | Freq: Once | ORAL | Status: AC
  Administered 2016-07-09: 4 mg via ORAL
  Filled 2016-07-09: qty 1

## 2016-07-09 MED ORDER — METOCLOPRAMIDE HCL 10 MG PO TABS: 10.0000 mg | ORAL_TABLET | Freq: Three times a day (TID) | ORAL | 1 refills | Status: DC
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
## 2016-07-09 NOTE — MAU Note (Addendum)
Patient states started spotting following intercourse last week; had stopped but started back yesterday and little heavier. Brown in color.  +nausea for "weeks"; has gotten worse; patient states so bad she is unable to go to work. Has not tried anything for the nausea.patient expresses need to confirm if its still ok to take her zoloft pregnant. LMP in march 1; patient unsure of this date. Was seen at [facility] and was told about [redacted] weeks pregnant

## 2016-07-09 NOTE — Discharge Instructions (Signed)
Threatened Miscarriage A threatened miscarriage is when you have vaginal bleeding during your first 20 weeks of pregnancy but the pregnancy has not ended. Your doctor will do tests to make sure you are still pregnant. The cause of the bleeding may not be known. This condition does not mean your pregnancy will end. It does increase the risk of it ending (complete miscarriage). Follow these instructions at home:  Make sure you keep all your doctor visits for prenatal care.  Get plenty of rest.  Do not have sex or use tampons if you have vaginal bleeding.  Do not douche.  Do not smoke or use drugs.  Do not drink alcohol.  Avoid caffeine. Contact a doctor if:  You have light bleeding from your vagina.  You have belly pain or cramping.  You have a fever. Get help right away if:  You have heavy bleeding from your vagina.  You have clots of blood coming from your vagina.  You have bad pain or cramps in your low back or belly.  You have fever, chills, and bad belly pain. This information is not intended to replace advice given to you by your health care provider. Make sure you discuss any questions you have with your health care provider. Document Released: 01/17/2008 Document Revised: 07/12/2015 Document Reviewed: 11/30/2012 Elsevier Interactive Patient Education  2017 [place] Hematoma A subchorionic hematoma is a gathering of blood between the outer wall of the placenta and the inner wall of the womb (uterus). The placenta is the organ that connects the fetus to the wall of the uterus. The placenta performs the feeding, breathing (oxygen to the fetus), and waste removal (excretory work) of the fetus. Subchorionic hematoma is the most common abnormality found on a result from ultrasonography done during the first trimester or early second trimester of pregnancy. If there has been little or no vaginal bleeding, early small hematomas usually shrink on their own and do  not affect your baby or pregnancy. The blood is gradually absorbed over 1-2 weeks. When bleeding starts later in pregnancy or the hematoma is larger or occurs in an older pregnant woman, the outcome may not be as good. Larger hematomas may get bigger, which increases the chances for miscarriage. Subchorionic hematoma also increases the risk of premature detachment of the placenta from the uterus, preterm (premature) labor, and stillbirth. Follow these instructions at home:  Stay on bed rest if your health care provider recommends this. Although bed rest will not prevent more bleeding or prevent a miscarriage, your health care provider may recommend bed rest until you are advised otherwise.  Avoid heavy lifting (more than 10 lb [4.5 kg]), exercise, sexual intercourse, or douching as directed by your health care provider.  Keep track of the number of pads you use each day and how soaked (saturated) they are. Write down this information.  Do not use tampons.  Keep all follow-up appointments as directed by your health care provider. Your health care provider may ask you to have follow-up blood tests or ultrasound tests or both. Get help right away if:  You have severe cramps in your stomach, back, abdomen, or pelvis.  You have a fever.  You pass large clots or tissue. Save any tissue for your health care provider to look at.  Your bleeding increases or you become lightheaded, feel weak, or have fainting episodes. This information is not intended to replace advice given to you by your health care provider. Make sure you discuss any  questions you have with your health care provider. Document Released: 05/21/2006 Document Revised: 07/12/2015 Document Reviewed: 09/02/2012 Elsevier Interactive Patient Education  2017 [employer].   Expedited Partner Therapy:  Information Sheet for Patients and Partners               You have been offered expedited partner therapy (EPT). This information sheet  contains important information and warnings you need to be aware of, so please read it carefully.   Expedited Partner Therapy (EPT) is the clinical practice of treating the sexual partners of persons who receive chlamydia, gonorrhea, or trichomoniasis diagnoses by providing medications or prescriptions to the patient. Patients then provide partners with these therapies without the health-care provider having examined the partner. In other words, EPT is a convenient, fast and private way for patients to help their sexual partners get treated.   Chlamydia and gonorrhea are bacterial infections you get from having sex with a person who is already infected. Trichomoniasis (or trich) is a very common sexually transmitted infection (STI) that is caused by infection with a protozoan parasite called Trichomonas vaginalis.  Many people with these infections dont know it because they feel fine, but without treatment these infections can cause serious health problems, such as pelvic inflammatory disease, ectopic pregnancy, infertility and increased risk of HIV.   It is important to get treated as soon as possible to protect your health, to avoid spreading these infections to others, and to prevent yourself from becoming re-infected. The good news is these infections can be easily cured with proper antibiotic medicine. The best way to take care of your self is to see a doctor or go to your local health department. If you are not able to see a doctor or other medical provider, you should take EPT.    Recommended Medication: EPT for Chlamydia:  Azithromycin (Zithromax) 1 gram orally in a single dose EPT for Gonorrhea:  Cefixime (Suprax) 400 milligrams orally in a single dose PLUS azithromycin (Zithromax) 1 gram orally in a single dose EPT for Trichomoniasis:  Metronidazole (Flagyl) 2 grams orally in a single dose   These medicines are very safe. However, you should not take them if you have ever had an allergic  reaction (like a rash) to any of these medicines: azithromycin (Zithromax), erythromycin, clarithromycin (Biaxin), metronidazole (Flagyl), tinidazole (Tindimax). If you are uncertain about whether you have an allergy, call your medical provider or pharmacist before taking this medicine. If you have a serious, long-term illness like kidney, liver or heart disease, colitis or stomach problems, or you are currently taking other prescription medication, talk to your provider before taking this medication.   Women: If you have lower belly pain, pain during sex, vomiting, or a fever, do not take this medicine. Instead, you should see a medical provider to be certain you do not have pelvic inflammatory disease (PID). PID can be serious and lead to infertility, pregnancy problems or chronic pelvic pain.   Pregnant Women: It is very important for you to see a doctor to get pregnancy services and pre-natal care. These antibiotics for EPT are safe for pregnant women, but you still need to see a medical provider as soon as possible. It is also important to note that Doxycycline is an alternative therapy for chlamydia, but it should not be taken by someone who is pregnant.   Men: If you have pain or swelling in the testicles or a fever, do not take this medicine and see a medical provider.  Men who have sex with men (MSM): MSM in [place] continue to experience high rates of syphilis and HIV. Many MSM with gonorrhea or chlamydia could also have syphilis and/or HIV and not know it. If you are a man who has sex with other men, it is very important that you see a medical provider and are tested for HIV and syphilis. EPT is not recommended for gonorrhea for MSM.  Recommended treatment for gonorrhea for MSM is Rocephin (shot) AND azithromycin due to decreased cure rate.  Please see your medical provider if this is the case.    Along with this information sheet is a prescription for the medicine. If you receive a  prescription it will be in your name and will indicate your date of birth, or it will be in the name of Expedited Partner Therapy.   In either case, you can have the prescription filled at a pharmacy. You will be responsible for the cost of the medicine, unless you have prescription drug coverage. In that case, you could provide your name so the pharmacy could bill your health plan.   Take the medication as directed. Some people will have a mild, upset stomach, which does not last long. AVOID alcohol 24 hours after taking metronidazole (Flagyl) to reduce the possibility of a disulfiram-like reaction (severe vomiting and abdominal pain).  After taking the medicine, do not have sex for 7 days. Do not share this medicine or give it to anyone else. It is important to tell everyone you have had sex with in the last 60 days that they need to go and get tested for sexually transmitted infections.   Ways to prevent these and other sexually transmitted infections (STIs):    Abstain from sex. This is the only sure way to avoid getting an STI.   Use barrier methods, such as condoms, consistently and correctly.   Limit the number of sexual partners.   Have regular physical exams, including testing for STIs.   For more information about EPT or other issues pertaining to an STI, please contact your medical provider or the [facility] Department at [phone] or [email or web address]

## 2016-07-10 LAB — CULTURE, OB URINE
Culture: NO GROWTH
Special Requests: NORMAL

## 2016-07-10 LAB — GC/CHLAMYDIA PROBE AMP (~~LOC~~) NOT AT ARMC
CHLAMYDIA, DNA PROBE: NEGATIVE
Neisseria Gonorrhea: NEGATIVE

## 2016-07-10 LAB — HIV ANTIBODY (ROUTINE TESTING W REFLEX): HIV SCREEN 4TH GENERATION: NONREACTIVE

## 2016-07-12 ENCOUNTER — Inpatient Hospital Stay (HOSPITAL_COMMUNITY)
Admission: AD | Admit: 2016-07-12 | Discharge: 2016-07-12 | Disposition: A | Discharge status: Home / Self Care | Payer: Medicaid Other | Source: Ambulatory Visit | Attending: Obstetrics & Gynecology | Admitting: Obstetrics & Gynecology

## 2016-07-12 DIAGNOSIS — Z87891 Personal history of nicotine dependence: Secondary | ICD-10-CM | POA: Insufficient documentation

## 2016-07-12 DIAGNOSIS — O209 Hemorrhage in early pregnancy, unspecified: Secondary | ICD-10-CM | POA: Diagnosis present

## 2016-07-12 DIAGNOSIS — Z803 Family history of malignant neoplasm of breast: Secondary | ICD-10-CM | POA: Diagnosis not present

## 2016-07-12 DIAGNOSIS — Z3A01 Less than 8 weeks gestation of pregnancy: Secondary | ICD-10-CM | POA: Insufficient documentation

## 2016-07-12 DIAGNOSIS — Z8544 Personal history of malignant neoplasm of other female genital organs: Secondary | ICD-10-CM | POA: Diagnosis not present

## 2016-07-12 DIAGNOSIS — O200 Threatened abortion: Secondary | ICD-10-CM | POA: Diagnosis not present

## 2016-07-12 LAB — URINALYSIS, ROUTINE W REFLEX MICROSCOPIC
BILIRUBIN URINE: NEGATIVE
GLUCOSE, UA: NEGATIVE mg/dL
Ketones, ur: NEGATIVE mg/dL
Leukocytes, UA: NEGATIVE
Nitrite: NEGATIVE
Protein, ur: NEGATIVE mg/dL
SPECIFIC GRAVITY, URINE: 1.01 (ref 1.005–1.030)
pH: 7.5 (ref 5.0–8.0)

## 2016-07-12 LAB — URINALYSIS, MICROSCOPIC (REFLEX)

## 2016-07-12 NOTE — Progress Notes (Signed)
A0T6 @ 7? Wksga. Presents to triage for bleeding. States was here couple days ago for bleeding but today bleeding more with clots. Denies pain.   1120: Provider at bs assessing. bs abdominal U/S performed. Unable to see anything. Transvaginal U/S performed and  Confirmed fhr. FHR 118.
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
## 2016-07-12 NOTE — Discharge Instructions (Signed)
Vaginal Bleeding During Pregnancy, First Trimester °A small amount of bleeding (spotting) from the vagina is common in early pregnancy. Sometimes the bleeding is normal and is not a problem, and sometimes it is a sign of something serious. Be sure to tell your doctor about any bleeding from your vagina right away. °Follow these instructions at home: °· Watch your condition for any changes. °· Follow your doctor's instructions about how active you can be. °· If you are on bed rest: °¨ You may need to stay in bed and only get up to use the bathroom. °¨ You may be allowed to do some activities. °¨ If you need help, make plans for someone to help you. °· Write down: °¨ The number of pads you use each day. °¨ How often you change pads. °¨ How soaked (saturated) your pads are. °· Do not use tampons. °· Do not douche. °· Do not have sex or orgasms until your doctor says it is okay. °· If you pass any tissue from your vagina, save the tissue so you can show it to your doctor. °· Only take medicines as told by your doctor. °· Do not take aspirin because it can make you bleed. °· Keep all follow-up visits as told by your doctor. °Contact a doctor if: °· You bleed from your vagina. °· You have cramps. °· You have labor pains. °· You have a fever that does not go away after you take medicine. °Get help right away if: °· You have very bad cramps in your back or belly (abdomen). °· You pass large clots or tissue from your vagina. °· You bleed more. °· You feel light-headed or weak. °· You pass out (faint). °· You have chills. °· You are leaking fluid or have a gush of fluid from your vagina. °· You pass out while pooping (having a bowel movement). °This information is not intended to replace advice given to you by your health care provider. Make sure you discuss any questions you have with your health care provider. °Document Released: 06/20/2013 Document Revised: 07/12/2015 Document Reviewed: 10/11/2012 °Elsevier Interactive  Patient Education © 2017 [employer] ° °

## 2016-07-18 ENCOUNTER — Telehealth: Payer: Self-pay

## 2016-07-18 NOTE — Telephone Encounter (Signed)
Patient needs to be pre auth for ob transvaginal on Monday. Thanks

## 2016-07-21 ENCOUNTER — Ambulatory Visit: Payer: Medicaid Other

## 2016-07-21 ENCOUNTER — Ambulatory Visit (HOSPITAL_COMMUNITY)
Admission: RE | Admit: 2016-07-21 | Discharge: 2016-07-21 | Disposition: A | Discharge status: Home / Self Care | Payer: Medicaid Other | Source: Ambulatory Visit | Attending: Obstetrics and Gynecology | Admitting: Obstetrics and Gynecology

## 2016-07-21 DIAGNOSIS — O468X1 Other antepartum hemorrhage, first trimester: Secondary | ICD-10-CM

## 2016-07-21 DIAGNOSIS — O418X10 Other specified disorders of amniotic fluid and membranes, first trimester, not applicable or unspecified: Secondary | ICD-10-CM

## 2016-07-21 DIAGNOSIS — O200 Threatened abortion: Secondary | ICD-10-CM

## 2016-07-21 DIAGNOSIS — O209 Hemorrhage in early pregnancy, unspecified: Secondary | ICD-10-CM

## 2016-07-24 ENCOUNTER — Ambulatory Visit (HOSPITAL_COMMUNITY)
Admission: RE | Admit: 2016-07-24 | Discharge: 2016-07-24 | Disposition: A | Discharge status: Home / Self Care | Payer: Medicaid Other | Source: Ambulatory Visit | Attending: Obstetrics and Gynecology | Admitting: Obstetrics and Gynecology

## 2016-07-24 ENCOUNTER — Ambulatory Visit: Payer: Medicaid Other | Admitting: General Practice

## 2016-07-24 DIAGNOSIS — Z3A08 8 weeks gestation of pregnancy: Secondary | ICD-10-CM | POA: Insufficient documentation

## 2016-07-24 DIAGNOSIS — O209 Hemorrhage in early pregnancy, unspecified: Secondary | ICD-10-CM | POA: Diagnosis present

## 2016-07-24 DIAGNOSIS — O219 Vomiting of pregnancy, unspecified: Secondary | ICD-10-CM

## 2016-07-24 IMAGING — US US OB TRANSVAGINAL
1 series · 15 of 28 positions shown · non-contrast
Comparison: Pelvic ultrasound [DATE]

CLINICAL DATA: Assess fetal viability ; the gestational sac was
seen to be located in the lower aspect of the uterine cavity on the
previous study.

EXAM:
TRANSVAGINAL OB ULTRASOUND
TECHNIQUE: Transvaginal ultrasound was performed for complete evaluation of the
gestation as well as the maternal uterus, adnexal regions, and
pelvic cul-de-sac.

[Series 1: us ob transvaginal · 15 of 40 slices shown]
[im 1/40]
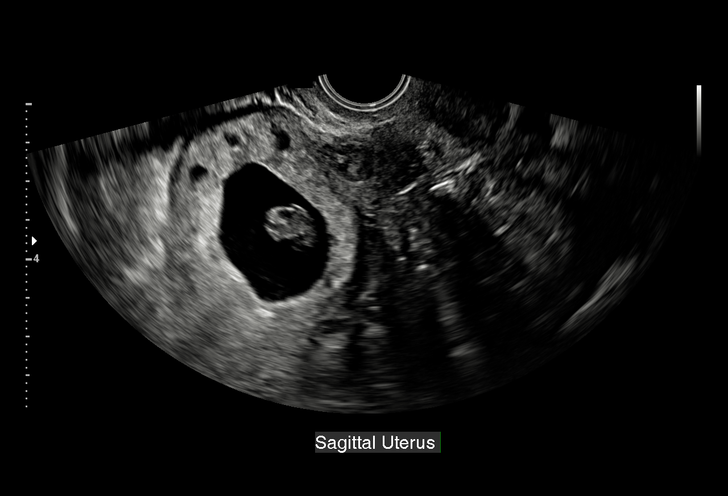
[im 3/40]
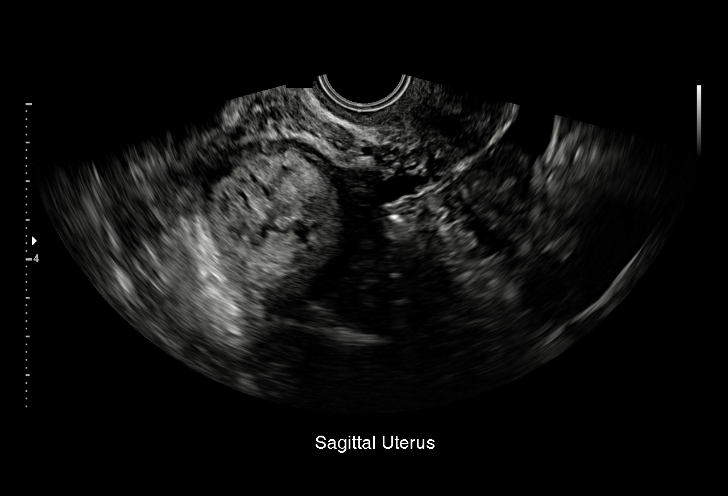
[im 6/40]
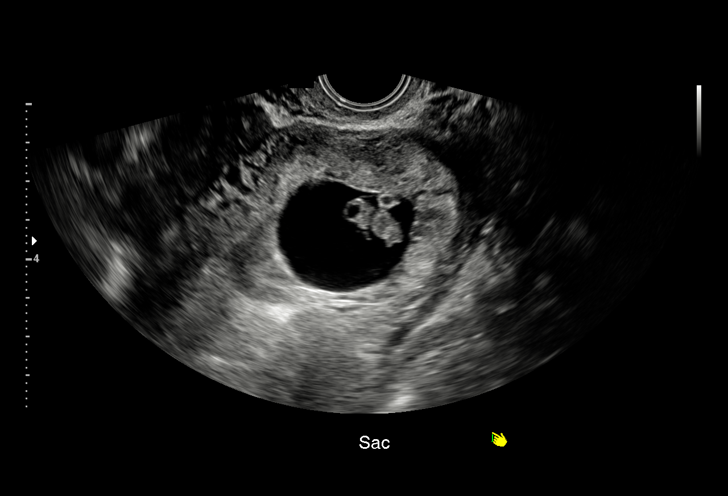
[im 9/40]
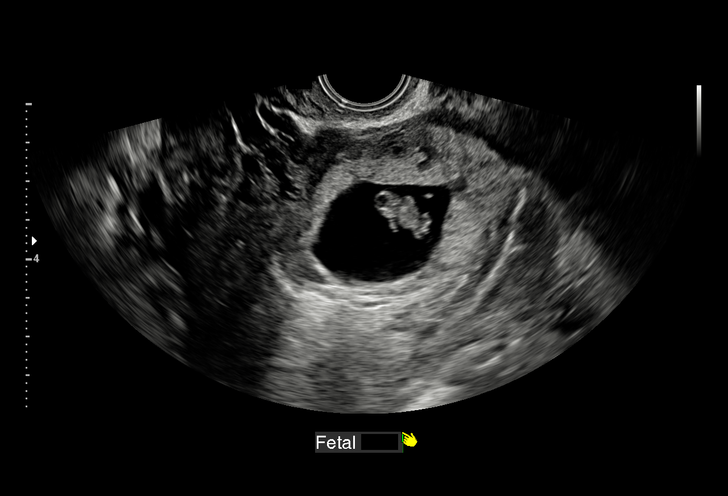
[im 12/40]
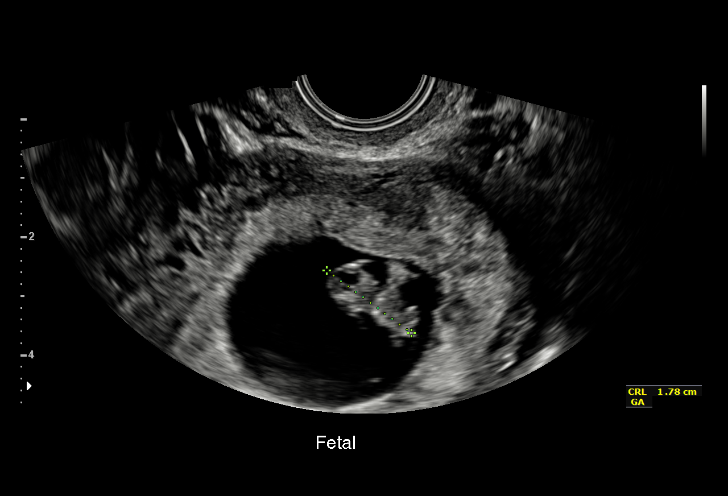
[im 15/40]
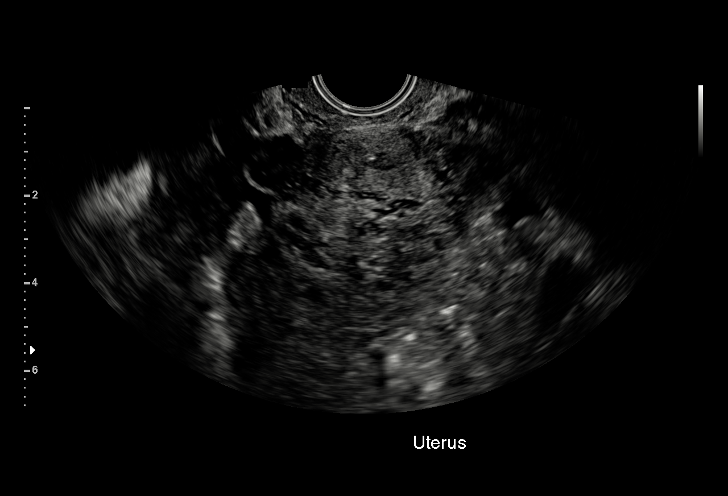
[im 18/40]
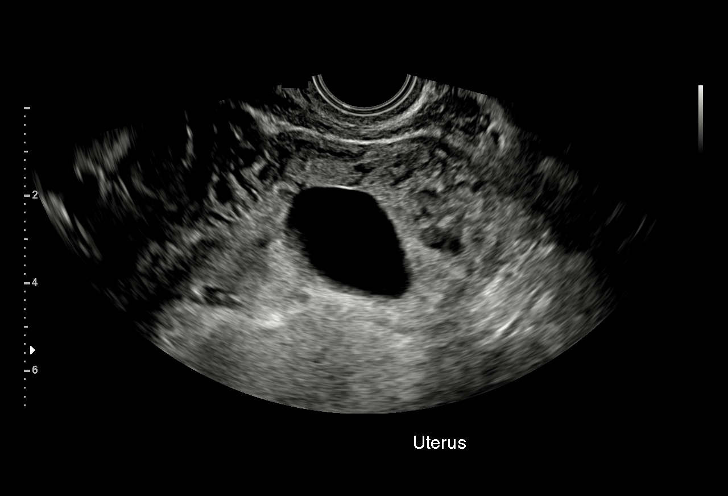
[im 21/40]
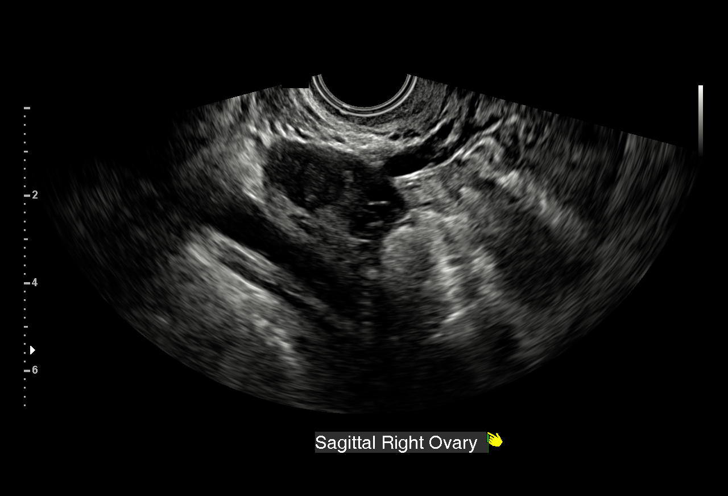
[im 22/40]
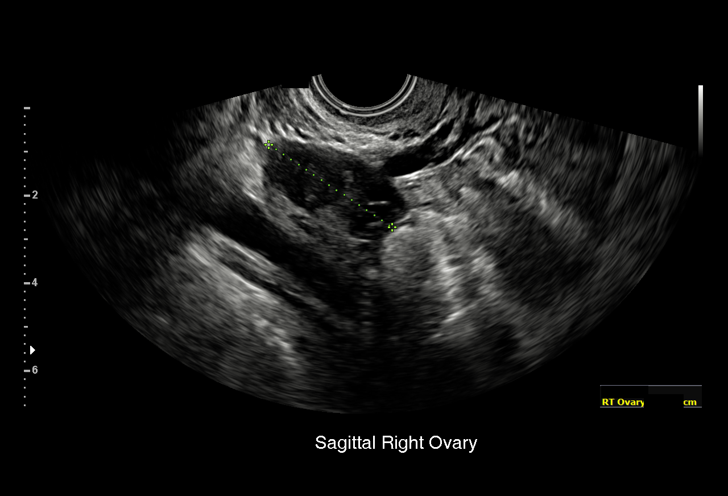
[im 25/40]
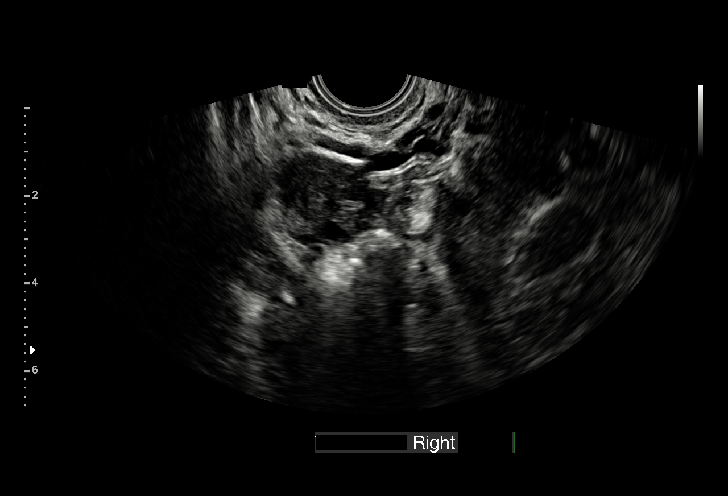
[im 28/40]
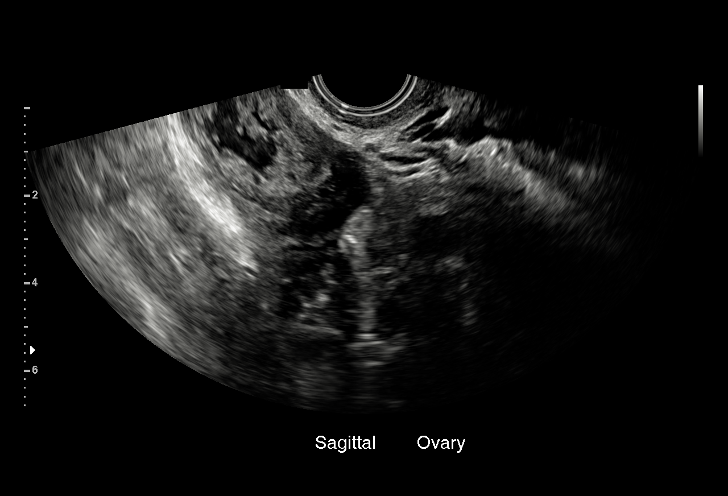
[im 31/40]
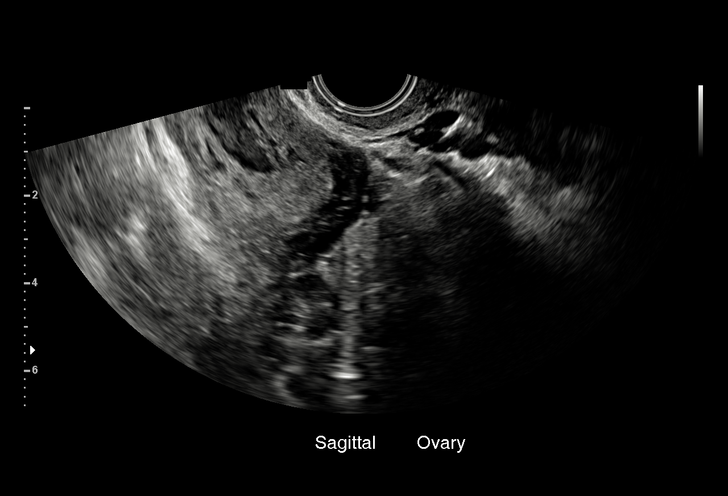
[im 34/40]
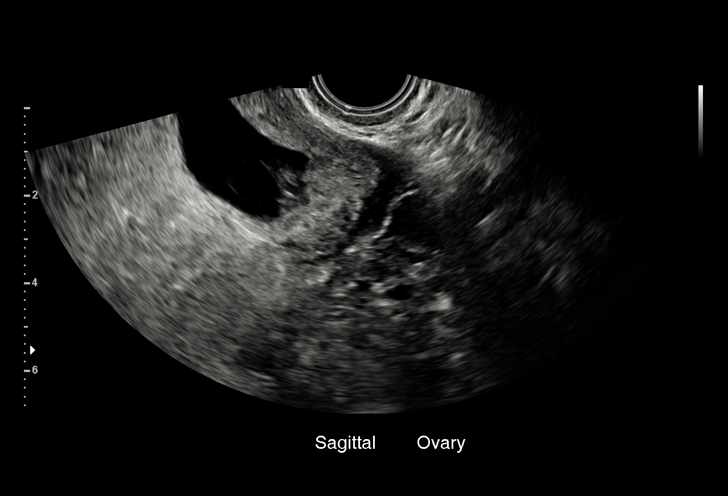
[im 37/40]
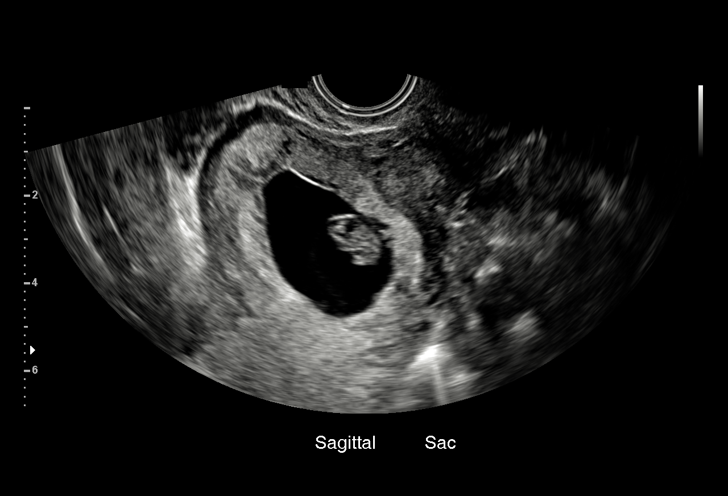
[im 40/40]
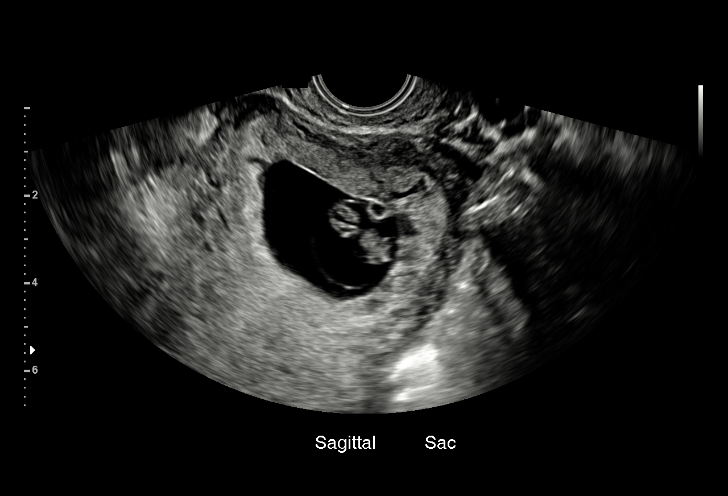

[15 of 28 positions shown; findings below may reference images not displayed]

FINDINGS: Intrauterine gestational sac: Present

Yolk sac:  Present

Embryo:  Present

Cardiac Activity: Present

Heart Rate: 166 bpm

CRL:   17.2  mm   8 w 1 d                  US EDC: [DATE]

Subchorionic hemorrhage:  None visualized.

Maternal uterus/adnexae: The gestational sac does not appear low
lying today. The ovaries are normal in size and echotexture.
IMPRESSION: Viable IUP with estimated gestational age of 8 weeks 1 day with
estimated date of confinement [DATE]. There is no
subchorionic hemorrhage.

## 2016-07-24 MED ORDER — DOXYLAMINE-PYRIDOXINE 10-10 MG PO TBEC: DELAYED_RELEASE_TABLET | ORAL | 6 refills | Status: DC

## 2016-07-24 NOTE — Progress Notes (Addendum)
Patient here for viability ultrasound results today. Picture given & informed her of dating. Patient verbalized understanding. Asked patient what meds/vitamins she is taking. Patient reports using PNV, reglan. & zoloft. Verified with Dr Elly Modena that those are safe in pregnancy & patient informed. Patient requesting alternative med for nausea/vomiting as she is sick all day. Patient uses [facility] on [address]. Per Dr Elly Modena, may send in diclegis. Patient informed. Patient had no other questions

## 2016-08-04 ENCOUNTER — Encounter: Payer: Self-pay | Admitting: Certified Nurse Midwife

## 2016-08-04 ENCOUNTER — Telehealth: Payer: Self-pay | Admitting: *Deleted

## 2016-08-04 ENCOUNTER — Ambulatory Visit (INDEPENDENT_AMBULATORY_CARE_PROVIDER_SITE_OTHER): Payer: Medicaid Other | Admitting: Certified Nurse Midwife

## 2016-08-04 ENCOUNTER — Other Ambulatory Visit (HOSPITAL_COMMUNITY)
Admission: RE | Admit: 2016-08-04 | Discharge: 2016-08-04 | Disposition: A | Discharge status: Home / Self Care | Payer: Medicaid Other | Source: Ambulatory Visit | Attending: Certified Nurse Midwife | Admitting: Certified Nurse Midwife

## 2016-08-04 VITALS — BP 132/79 | HR 99 | Wt 114.8 lb

## 2016-08-04 DIAGNOSIS — Z98891 History of uterine scar from previous surgery: Secondary | ICD-10-CM | POA: Insufficient documentation

## 2016-08-04 DIAGNOSIS — O209 Hemorrhage in early pregnancy, unspecified: Secondary | ICD-10-CM

## 2016-08-04 DIAGNOSIS — O0990 Supervision of high risk pregnancy, unspecified, unspecified trimester: Secondary | ICD-10-CM | POA: Diagnosis present

## 2016-08-04 DIAGNOSIS — Z3481 Encounter for supervision of other normal pregnancy, first trimester: Secondary | ICD-10-CM | POA: Diagnosis not present

## 2016-08-04 DIAGNOSIS — O219 Vomiting of pregnancy, unspecified: Secondary | ICD-10-CM

## 2016-08-04 DIAGNOSIS — N901 Moderate vulvar dysplasia: Secondary | ICD-10-CM

## 2016-08-04 MED ORDER — PROMETHAZINE HCL 12.5 MG PO TABS: 12.5000 mg | ORAL_TABLET | Freq: Four times a day (QID) | ORAL | 2 refills | Status: DC | PRN

## 2016-08-04 MED ORDER — PROMETHAZINE HCL 25 MG RE SUPP: 25.0000 mg | Freq: Four times a day (QID) | RECTAL | 2 refills | Status: DC | PRN

## 2016-08-04 NOTE — Telephone Encounter (Signed)
Attempted to contact the patient regarding an appt. Left the patient a message to call the office back tomorrow to schedule an appt.

## 2016-08-04 NOTE — Addendum Note (Signed)
Addended by: Kandis Cocking A on: 08/04/2016 03:43 PM   Modules accepted: Orders
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
## 2016-08-04 NOTE — Progress Notes (Signed)
Patient was seen at [facility] for early bleeding. She has not had any since. Patient does have a history of endometriosis and does have scar tissue and adhesions that cause her to have some abdominal pain.

## 2016-08-05 ENCOUNTER — Telehealth: Payer: Self-pay | Admitting: *Deleted

## 2016-08-05 NOTE — Telephone Encounter (Signed)
Scheduled the patient for new patient appt for Monday June 25th at 10:30am; arrive at 10:15am

## 2016-08-06 ENCOUNTER — Other Ambulatory Visit: Payer: Self-pay | Admitting: Certified Nurse Midwife

## 2016-08-06 ENCOUNTER — Telehealth: Payer: Self-pay

## 2016-08-06 DIAGNOSIS — O0990 Supervision of high risk pregnancy, unspecified, unspecified trimester: Secondary | ICD-10-CM

## 2016-08-06 DIAGNOSIS — B373 Candidiasis of vulva and vagina: Secondary | ICD-10-CM

## 2016-08-06 DIAGNOSIS — B3731 Acute candidiasis of vulva and vagina: Secondary | ICD-10-CM

## 2016-08-06 LAB — CERVICOVAGINAL ANCILLARY ONLY
BACTERIAL VAGINITIS: NEGATIVE
Candida vaginitis: POSITIVE — AB
Chlamydia: NEGATIVE
Neisseria Gonorrhea: NEGATIVE
Trichomonas: NEGATIVE

## 2016-08-06 LAB — HEMOGLOBINOPATHY EVALUATION
HGB C: 0 %
HGB S: 0 %
HGB VARIANT: 0 %
Hemoglobin A2 Quantitation: 2.5 % (ref 1.8–3.2)
Hemoglobin F Quantitation: 0.8 % (ref 0.0–2.0)
Hgb A: 96.7 % (ref 96.4–98.8)

## 2016-08-06 LAB — PRENATAL PROFILE I(LABCORP)
Antibody Screen: NEGATIVE
Basophils Absolute: 0 10*3/uL (ref 0.0–0.2)
Basos: 0 %
EOS (ABSOLUTE): 0 10*3/uL (ref 0.0–0.4)
Eos: 0 %
HEMOGLOBIN: 13.6 g/dL (ref 11.1–15.9)
HEP B S AG: NEGATIVE
Hematocrit: 38.6 % (ref 34.0–46.6)
IMMATURE GRANS (ABS): 0 10*3/uL (ref 0.0–0.1)
IMMATURE GRANULOCYTES: 0 %
LYMPHS: 23 %
Lymphocytes Absolute: 1.3 10*3/uL (ref 0.7–3.1)
MCH: 31.7 pg (ref 26.6–33.0)
MCHC: 35.2 g/dL (ref 31.5–35.7)
MCV: 90 fL (ref 79–97)
MONOS ABS: 0.3 10*3/uL (ref 0.1–0.9)
Monocytes: 5 %
NEUTROS PCT: 72 %
Neutrophils Absolute: 4.2 10*3/uL (ref 1.4–7.0)
Platelets: 268 10*3/uL (ref 150–379)
RBC: 4.29 x10E6/uL (ref 3.77–5.28)
RDW: 13.8 % (ref 12.3–15.4)
RH TYPE: POSITIVE
RPR: NONREACTIVE
Rubella Antibodies, IGG: 3.66 index (ref >0.99)
WBC: 5.8 10*3/uL (ref 3.4–10.8)

## 2016-08-06 LAB — CYTOLOGY - PAP
ADEQUACY: ABSENT
DIAGNOSIS: NEGATIVE

## 2016-08-06 LAB — URINE CULTURE, OB REFLEX

## 2016-08-06 LAB — VITAMIN D 25 HYDROXY (VIT D DEFICIENCY, FRACTURES): Vit D, 25-Hydroxy: 34.8 ng/mL (ref 30.0–100.0)

## 2016-08-06 LAB — HEMOGLOBIN A1C
ESTIMATED AVERAGE GLUCOSE: 82 mg/dL
HEMOGLOBIN A1C: 4.5 % — AB (ref 4.8–5.6)

## 2016-08-06 LAB — CULTURE, OB URINE

## 2016-08-06 LAB — VARICELLA ZOSTER ANTIBODY, IGG: VARICELLA: 2066 {index} (ref >165)

## 2016-08-06 LAB — HIV ANTIBODY (ROUTINE TESTING W REFLEX): HIV SCREEN 4TH GENERATION: NONREACTIVE

## 2016-08-06 MED ORDER — TERCONAZOLE 0.8 % VA CREA: 1.0000 | TOPICAL_CREAM | Freq: Every day | VAGINAL | 0 refills | Status: DC

## 2016-08-06 NOTE — Telephone Encounter (Signed)
S/w pt and advised of lab results and rx sent.

## 2016-08-11 ENCOUNTER — Encounter: Payer: Self-pay | Admitting: Gynecologic Oncology

## 2016-08-11 ENCOUNTER — Ambulatory Visit: Payer: Medicaid Other | Attending: Gynecologic Oncology | Admitting: Gynecologic Oncology

## 2016-08-11 VITALS — BP 101/67 | HR 78 | Temp 98.4°F | Resp 18 | Wt 118.1 lb

## 2016-08-11 DIAGNOSIS — Z08 Encounter for follow-up examination after completed treatment for malignant neoplasm: Secondary | ICD-10-CM | POA: Insufficient documentation

## 2016-08-11 DIAGNOSIS — N901 Moderate vulvar dysplasia: Secondary | ICD-10-CM | POA: Diagnosis not present

## 2016-08-11 DIAGNOSIS — Z803 Family history of malignant neoplasm of breast: Secondary | ICD-10-CM | POA: Diagnosis not present

## 2016-08-11 DIAGNOSIS — D071 Carcinoma in situ of vulva: Secondary | ICD-10-CM | POA: Diagnosis present

## 2016-08-11 DIAGNOSIS — Z8544 Personal history of malignant neoplasm of other female genital organs: Secondary | ICD-10-CM | POA: Insufficient documentation

## 2016-08-11 DIAGNOSIS — Z9889 Other specified postprocedural states: Secondary | ICD-10-CM | POA: Insufficient documentation

## 2016-08-11 DIAGNOSIS — Z87891 Personal history of nicotine dependence: Secondary | ICD-10-CM | POA: Diagnosis not present
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
## 2016-08-11 NOTE — Patient Instructions (Signed)
We will call you with the results of your biopsy.  Please call for any questions or concerns.  VULVAR BIOPSY POST-PROCEDURE INSTRUCTIONS  1. You may take Ibuprofen, Aleve or Tylenol for pain if needed.    2. You may have a small amount of spotting.  You should wear a mini pad for the next few days.  3. You may use some topical Neosporin ointment if you would like (over the counter is fine).  4. You need to call if you have redness around the biopsy site, if there is any unusual draining, if the bleeding is heavy, or if you are concerned.  5. Shower or bathe as normal  6. We will call you within one week with results or we will discuss the results at your follow-up appointment if needed.

## 2016-08-14 ENCOUNTER — Telehealth: Payer: Self-pay | Admitting: Gynecologic Oncology

## 2016-08-14 DIAGNOSIS — N9089 Other specified noninflammatory disorders of vulva and perineum: Secondary | ICD-10-CM

## 2016-08-14 NOTE — Telephone Encounter (Signed)
Spoke with patient about Dr. Serita Grit recommendations.  While ordering the clobetasol, a warning for extreme use in pregnancy came up.  Per Dr. Denman George, hold on prescription and reach out to [facility].  Per patient, her OB is [facility] Lemon.  Message sent via EPIC.

## 2016-08-14 NOTE — Telephone Encounter (Signed)
-----   Message from Everitt Amber, MD sent at 08/14/2016  3:53 PM EDT ----- Irritation but no dysplasia. Recommend clobetasol ointment to vulva for irritation. This is safe in pregnancy.

## 2016-08-15 ENCOUNTER — Telehealth: Payer: Self-pay | Admitting: Gynecologic Oncology

## 2016-08-15 ENCOUNTER — Other Ambulatory Visit: Payer: Self-pay | Admitting: Gynecologic Oncology

## 2016-08-15 ENCOUNTER — Other Ambulatory Visit: Payer: Self-pay | Admitting: Certified Nurse Midwife

## 2016-08-15 DIAGNOSIS — L900 Lichen sclerosus et atrophicus: Secondary | ICD-10-CM

## 2016-08-15 MED ORDER — CLOBETASOL PROPIONATE 0.05 % EX OINT: 1.0000 "application " | TOPICAL_OINTMENT | Freq: Two times a day (BID) | CUTANEOUS | 0 refills | Status: DC

## 2016-08-15 NOTE — Telephone Encounter (Signed)
Patient informed that clobetasol was at [facility]  It was prescribed by R. Denney.  Advised her to call for any questions or concerns.

## 2016-08-19 ENCOUNTER — Other Ambulatory Visit: Payer: Self-pay | Admitting: Certified Nurse Midwife

## 2016-08-19 DIAGNOSIS — O0990 Supervision of high risk pregnancy, unspecified, unspecified trimester: Secondary | ICD-10-CM

## 2016-08-19 LAB — INHERITEST SOCIETY GUIDED

## 2016-08-22 ENCOUNTER — Ambulatory Visit (HOSPITAL_COMMUNITY)
Admission: RE | Admit: 2016-08-22 | Discharge: 2016-08-22 | Disposition: A | Discharge status: Home / Self Care | Payer: Medicaid Other | Source: Ambulatory Visit | Attending: Certified Nurse Midwife | Admitting: Certified Nurse Midwife

## 2016-08-22 ENCOUNTER — Encounter (HOSPITAL_COMMUNITY): Payer: Self-pay

## 2016-08-22 DIAGNOSIS — O0990 Supervision of high risk pregnancy, unspecified, unspecified trimester: Secondary | ICD-10-CM | POA: Insufficient documentation

## 2016-08-22 DIAGNOSIS — Z3A12 12 weeks gestation of pregnancy: Secondary | ICD-10-CM | POA: Diagnosis not present

## 2016-08-22 DIAGNOSIS — O209 Hemorrhage in early pregnancy, unspecified: Secondary | ICD-10-CM | POA: Insufficient documentation

## 2016-08-22 IMAGING — US US MFM FETAL NUCHAL TRANSLUCENCY
1 series · 15 of 28 positions shown · non-contrast
Comparison: none

[Series 1: us mfm fetal nuchal translucency · 15 of 37 slices shown]
[im 1/37]
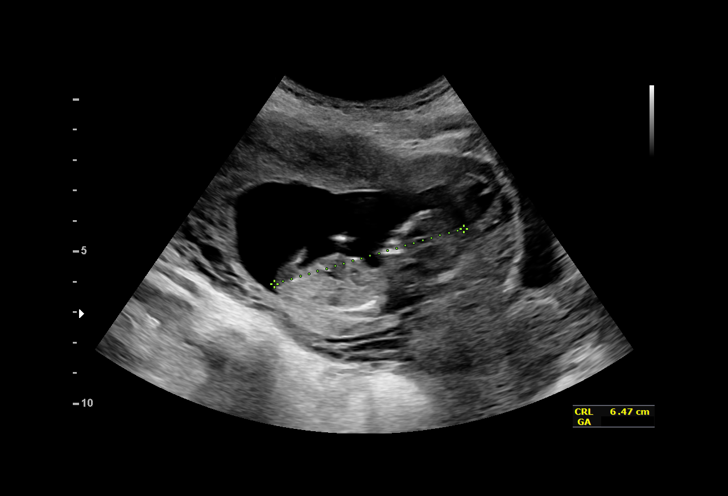
[im 3/37]
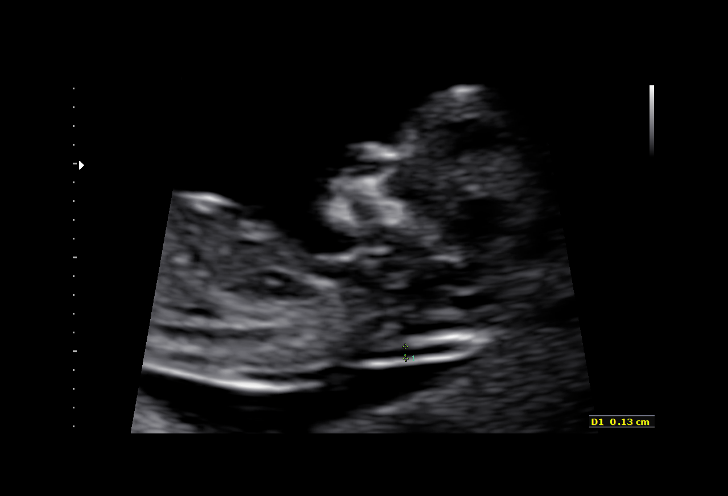
[im 6/37]
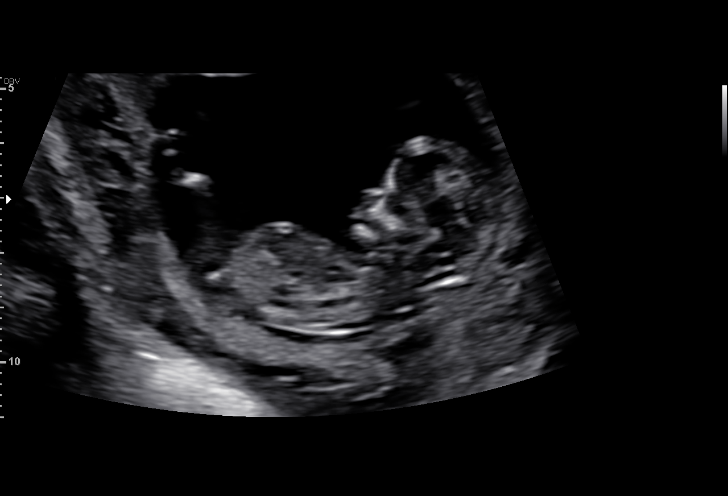
[im 9/37]
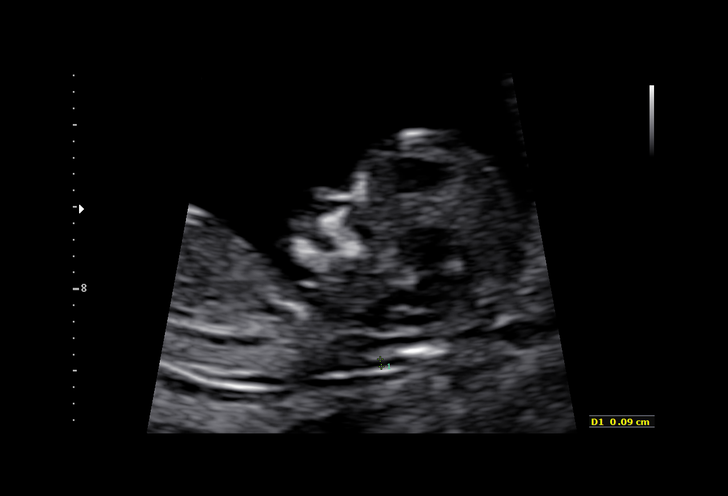
[im 11/37]
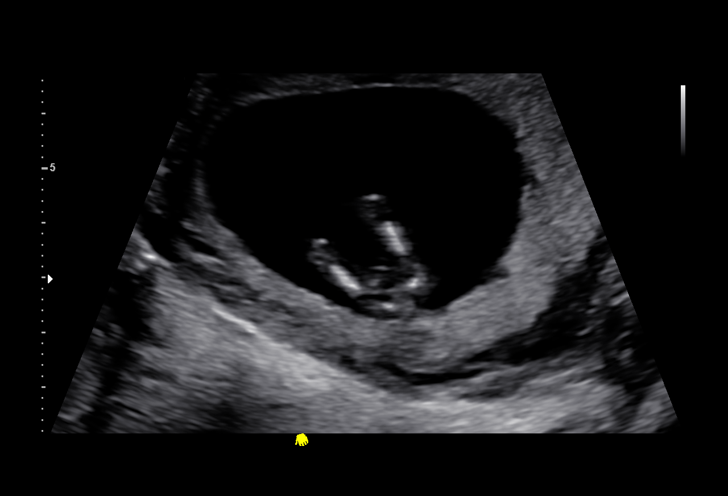
[im 14/37]
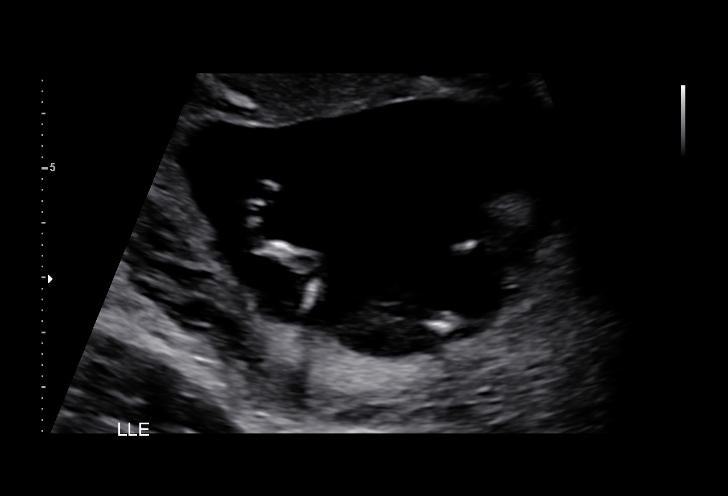
[im 17/37]
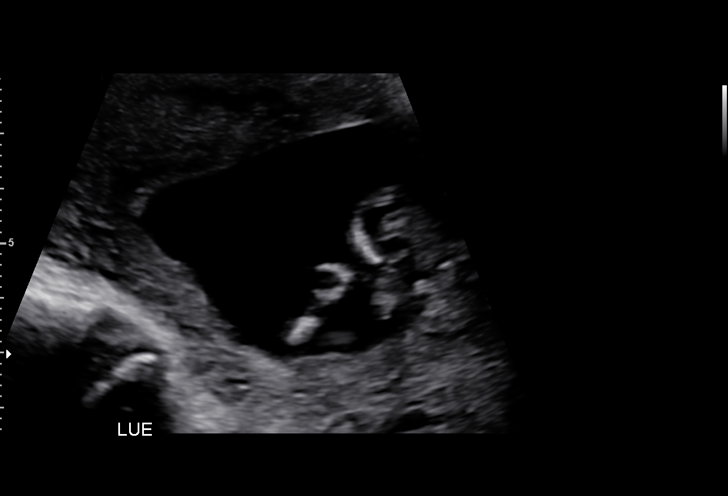
[im 19/37]
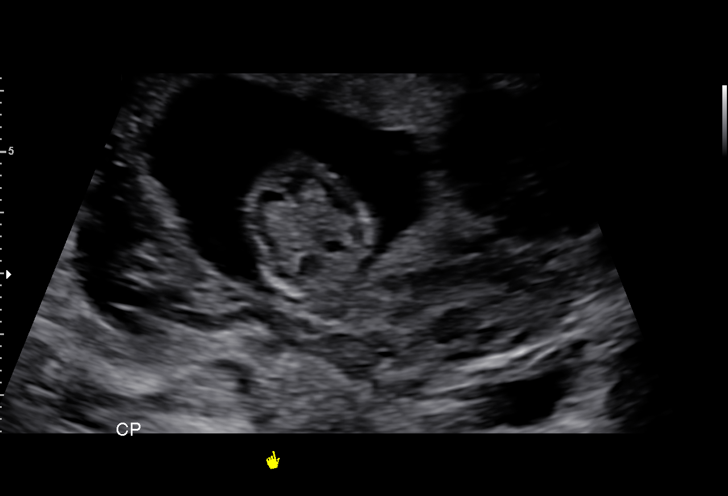
[im 21/37]
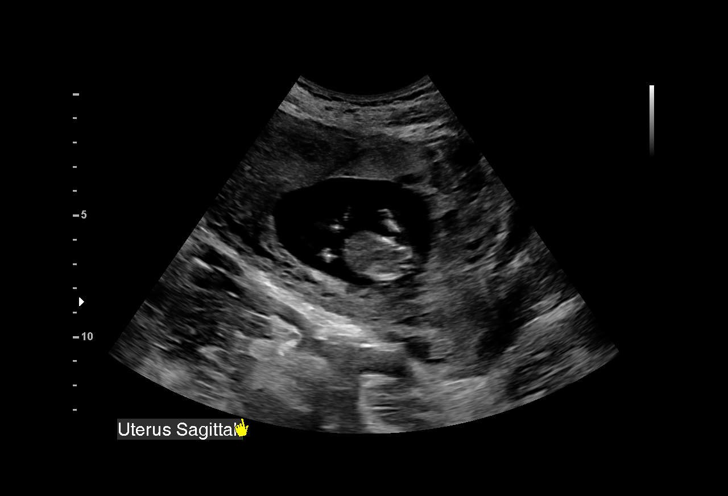
[im 23/37]
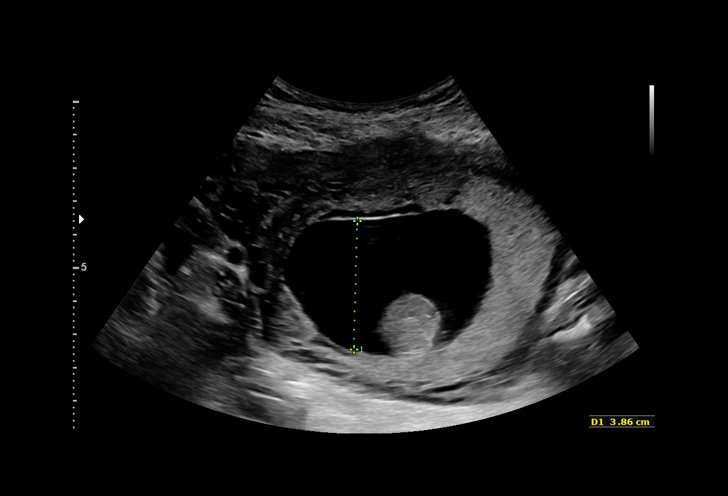
[im 26/37]
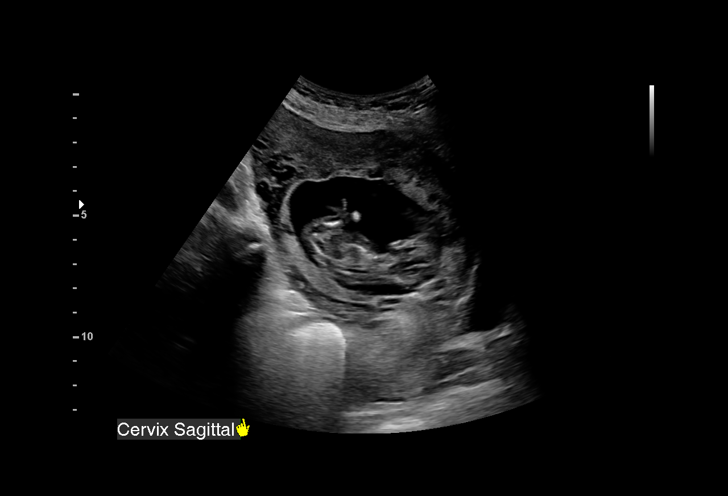
[im 29/37]
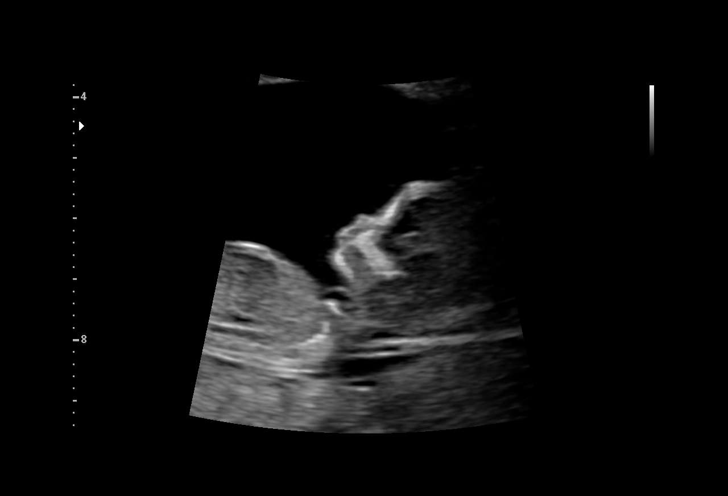
[im 31/37]
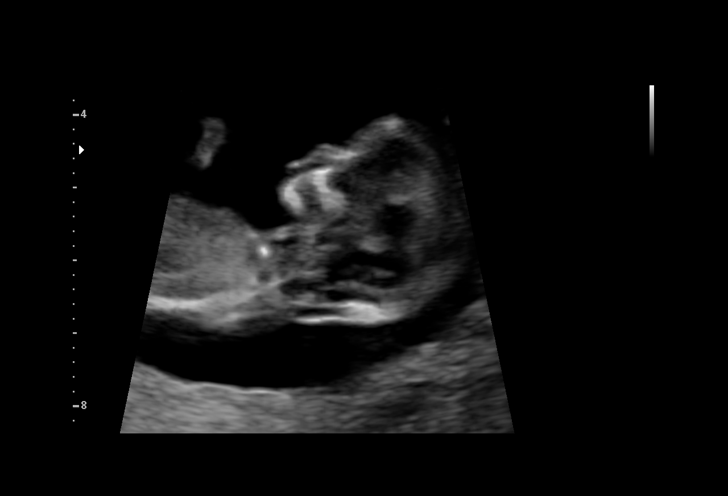
[im 34/37]
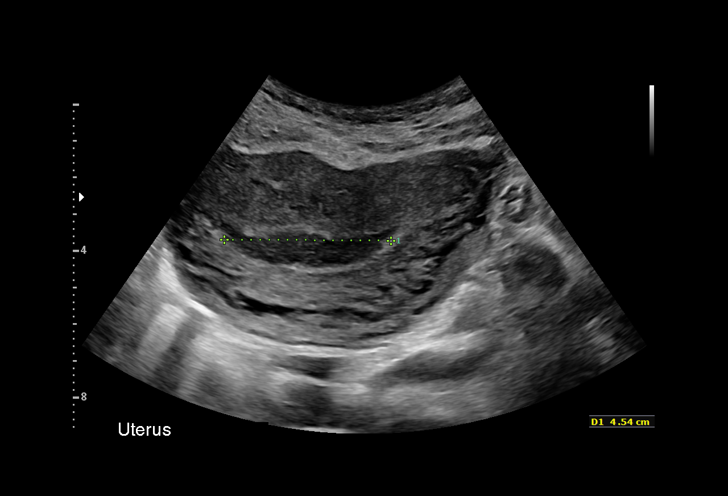
[im 37/37]
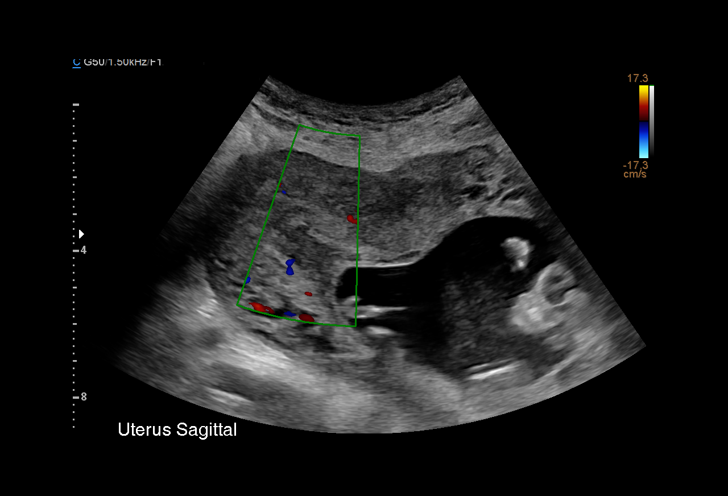

[15 of 28 positions shown; findings below may reference images not displayed]

Road [HOSPITAL]

TRANSLUCENCY

Indications

12 weeks gestation of pregnancy
Encounter for nuchal translucency              [NT]
History of cesarean delivery (x3), currently   [NT]
pregnant
OB History

Blood Type:            Height:  5'3"   Weight (lb):  118      BMI:
Gravidity:    4         Term:   3
Living:       3
Fetal Evaluation

Num Of Fetuses:     1
Fetal Heart         154
Rate(bpm):
Cardiac Activity:   Observed
Presentation:       Variable

Amniotic Fluid
AFI FV:      Subjectively within normal limits
Biometry

CRL:      64.1  mm     G. Age:  12w 4d                  EDD:   [DATE]
Gestational Age

Best:          12w 3d    Det. By:   Early Ultrasound         EDD:   [DATE]
([DATE])
1st Trimester Genetic Sonogram Screening

CRL:            64.1  mm    G. Age:   12w 4d                 EDD:   [DATE]
Nuc Trans:       1.3  mm
Nasal Bone:                 Present
Anatomy

Stomach:               Appears normal, left   Bladder:                Appears normal
sided
Cervix Uterus Adnexa

Cervix
Normal appearance by transabdominal scan.

Uterus
Subchorionic RECHELLE ANN seen
Impression

Singleton intrauterine pregnancy at 12 weeks 3 days
gestation with fetal cardiac activity
Variable presentation
NT 1.3 mm
NB present
Recommendations

First trimester screen drawn today after discussion of the RBA

## 2016-08-25 ENCOUNTER — Other Ambulatory Visit: Payer: Self-pay | Admitting: Certified Nurse Midwife

## 2016-08-25 DIAGNOSIS — O0990 Supervision of high risk pregnancy, unspecified, unspecified trimester: Secondary | ICD-10-CM

## 2016-09-01 ENCOUNTER — Ambulatory Visit (INDEPENDENT_AMBULATORY_CARE_PROVIDER_SITE_OTHER): Payer: Medicaid Other | Admitting: Obstetrics & Gynecology

## 2016-09-01 VITALS — BP 104/71 | HR 111 | Wt 124.0 lb

## 2016-09-01 DIAGNOSIS — L280 Lichen simplex chronicus: Secondary | ICD-10-CM

## 2016-09-01 DIAGNOSIS — O0990 Supervision of high risk pregnancy, unspecified, unspecified trimester: Secondary | ICD-10-CM

## 2016-09-01 DIAGNOSIS — O0991 Supervision of high risk pregnancy, unspecified, first trimester: Secondary | ICD-10-CM

## 2016-09-01 DIAGNOSIS — O34219 Maternal care for unspecified type scar from previous cesarean delivery: Secondary | ICD-10-CM

## 2016-09-01 DIAGNOSIS — Z98891 History of uterine scar from previous surgery: Secondary | ICD-10-CM
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
## 2016-09-01 NOTE — Patient Instructions (Signed)
Return to clinic for any scheduled appointments or obstetric concerns, or go to [facility] for evaluation  

## 2016-09-01 NOTE — Progress Notes (Signed)
Pt states she is no longer taking Zoloft, was giving her severe HA.  Pt states she has not had in about 2 weeks. Pt states she is back in therapy, doing well.

## 2016-09-03 ENCOUNTER — Other Ambulatory Visit (HOSPITAL_COMMUNITY): Payer: Self-pay

## 2016-09-29 ENCOUNTER — Ambulatory Visit (INDEPENDENT_AMBULATORY_CARE_PROVIDER_SITE_OTHER): Payer: Medicaid Other | Admitting: Obstetrics and Gynecology

## 2016-09-29 VITALS — BP 107/69 | HR 86 | Wt 129.0 lb

## 2016-09-29 DIAGNOSIS — O0992 Supervision of high risk pregnancy, unspecified, second trimester: Secondary | ICD-10-CM

## 2016-09-29 DIAGNOSIS — Z98891 History of uterine scar from previous surgery: Secondary | ICD-10-CM

## 2016-09-29 DIAGNOSIS — O0990 Supervision of high risk pregnancy, unspecified, unspecified trimester: Secondary | ICD-10-CM

## 2016-09-29 MED ORDER — COMFORT FIT MATERNITY SUPP MED MISC: 0 refills | Status: DC
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
## 2016-09-29 NOTE — Progress Notes (Signed)
Patient complains of burning fire sensation on her left side and down left leg.

## 2016-10-02 LAB — AFP, SERUM, OPEN SPINA BIFIDA
AFP MoM: 1.1
AFP Value: 54 ng/mL
GEST. AGE ON COLLECTION DATE: 17.6 wk
Maternal Age At EDD: 33 yr
OSBR Risk 1 IN: 10000
TEST RESULTS AFP: NEGATIVE
Weight: 129 [lb_av]

## 2016-10-03 ENCOUNTER — Other Ambulatory Visit: Payer: Self-pay

## 2016-10-07 ENCOUNTER — Encounter (HOSPITAL_COMMUNITY): Payer: Self-pay

## 2016-10-07 ENCOUNTER — Ambulatory Visit (HOSPITAL_COMMUNITY)
Admission: RE | Admit: 2016-10-07 | Discharge: 2016-10-07 | Disposition: A | Discharge status: Home / Self Care | Payer: Medicaid Other | Source: Ambulatory Visit | Attending: Certified Nurse Midwife | Admitting: Certified Nurse Midwife

## 2016-10-07 ENCOUNTER — Other Ambulatory Visit: Payer: Self-pay | Admitting: Certified Nurse Midwife

## 2016-10-07 DIAGNOSIS — O34219 Maternal care for unspecified type scar from previous cesarean delivery: Secondary | ICD-10-CM | POA: Insufficient documentation

## 2016-10-07 DIAGNOSIS — Z3A19 19 weeks gestation of pregnancy: Secondary | ICD-10-CM | POA: Diagnosis not present

## 2016-10-07 DIAGNOSIS — Z98891 History of uterine scar from previous surgery: Secondary | ICD-10-CM

## 2016-10-07 DIAGNOSIS — Z3689 Encounter for other specified antenatal screening: Secondary | ICD-10-CM | POA: Diagnosis not present

## 2016-10-07 DIAGNOSIS — O0990 Supervision of high risk pregnancy, unspecified, unspecified trimester: Secondary | ICD-10-CM | POA: Diagnosis present

## 2016-10-07 IMAGING — US US MFM OB DETAIL+14 WK
1 series · 13 of 28 positions shown · non-contrast
Comparison: none

[Series 1: us mfm ob detail+14 wk · 136 acquisitions, 13 frames shown]
[im 6/136]
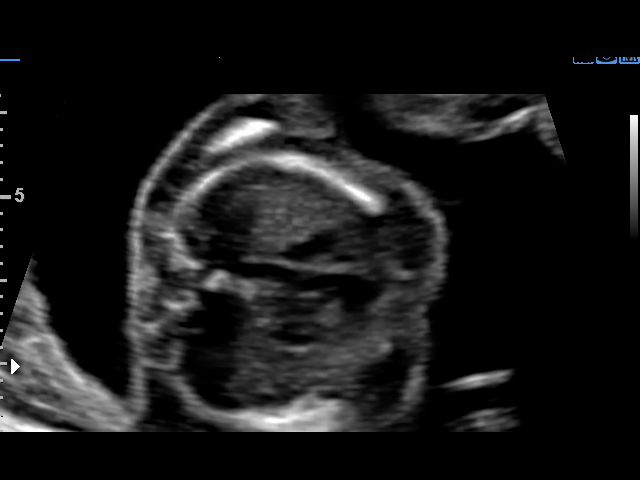
[im 16/136]
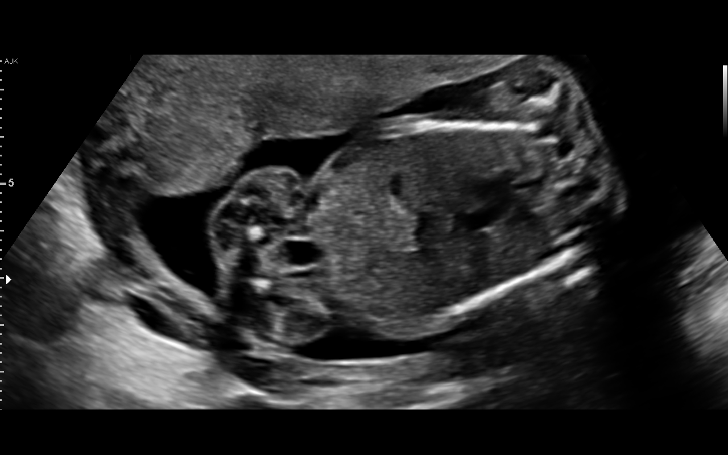
[im 26/136]
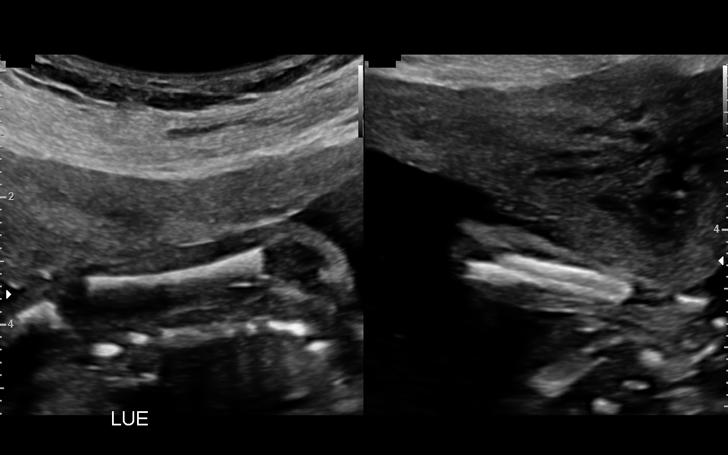
[im 36/136]
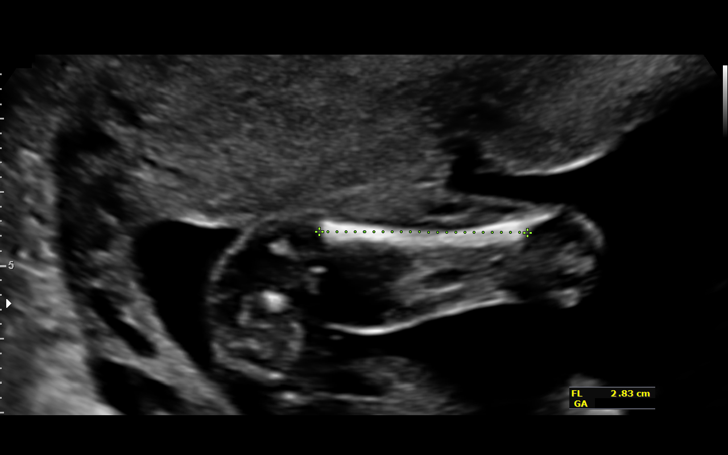
[im 46/136]
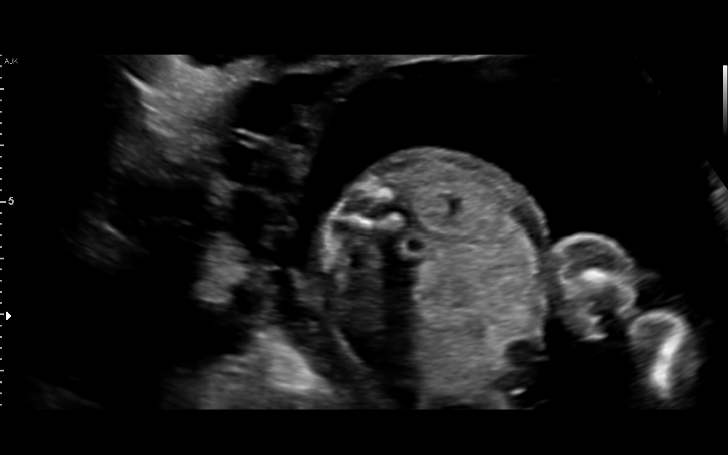
[im 56/136]
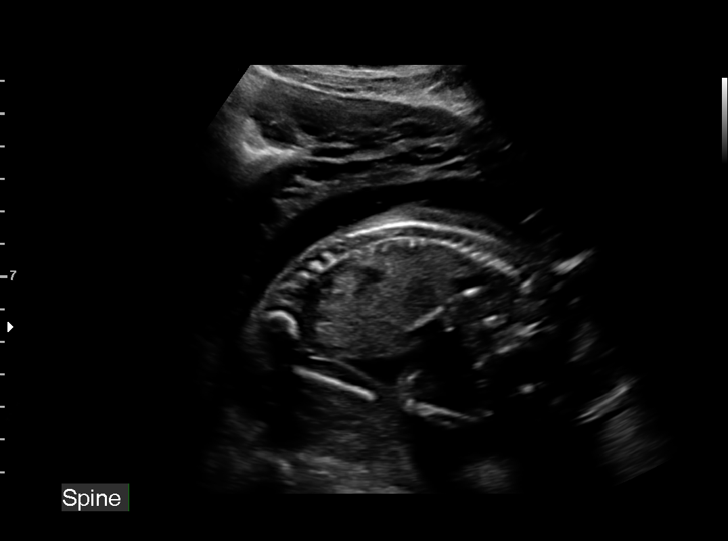
[im 71/136]
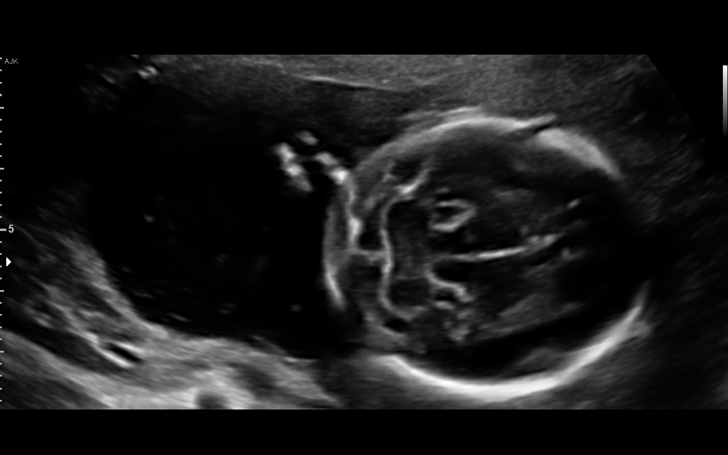
[im 81/136]
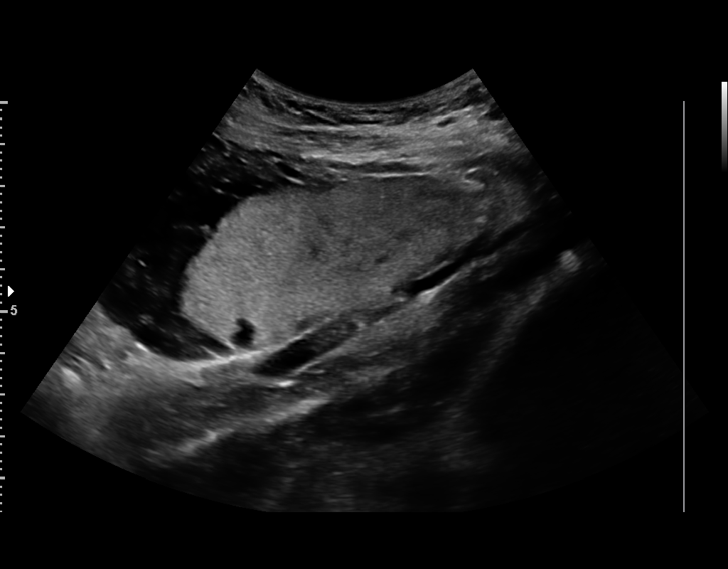
[im 91/136]
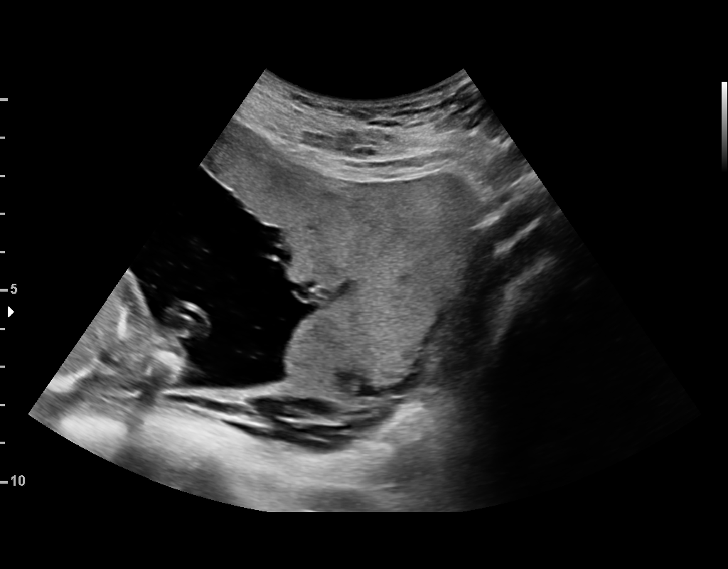
[im 101/136]
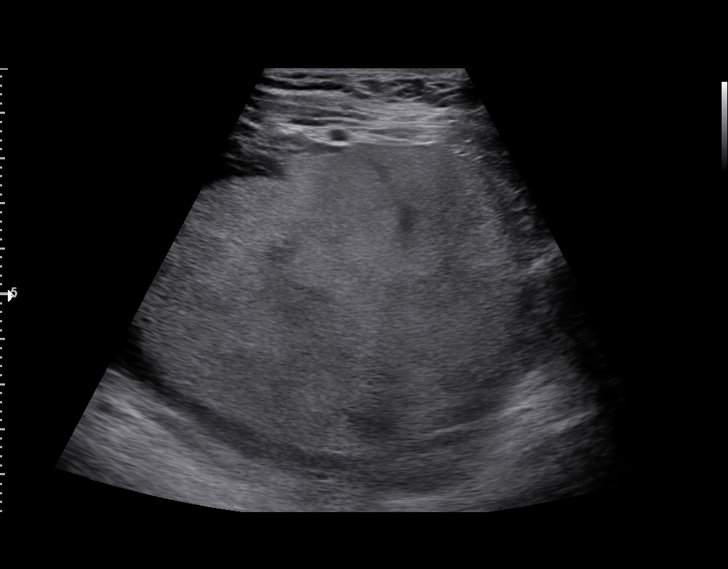
[im 111/136]
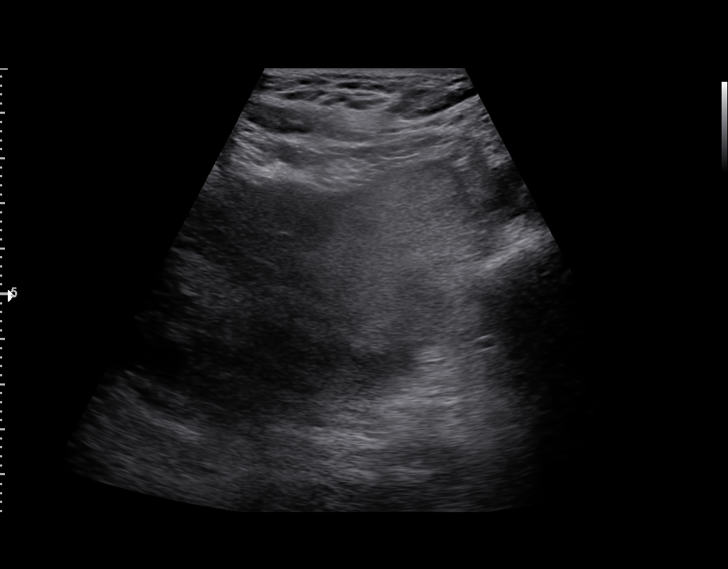
[im 121/136]
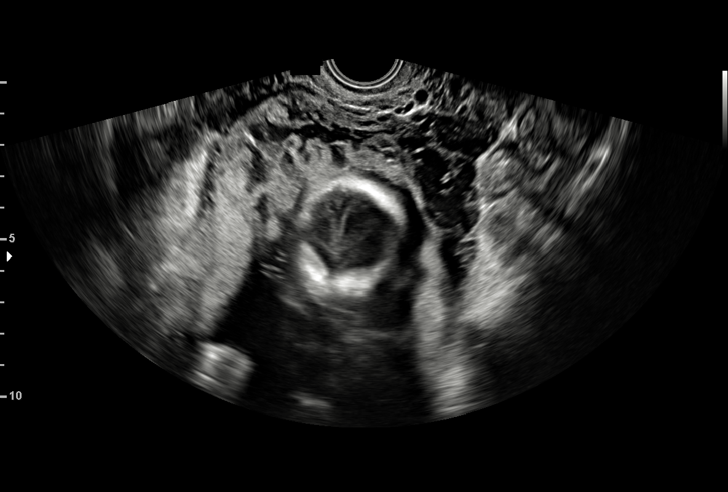
[im 131/136]
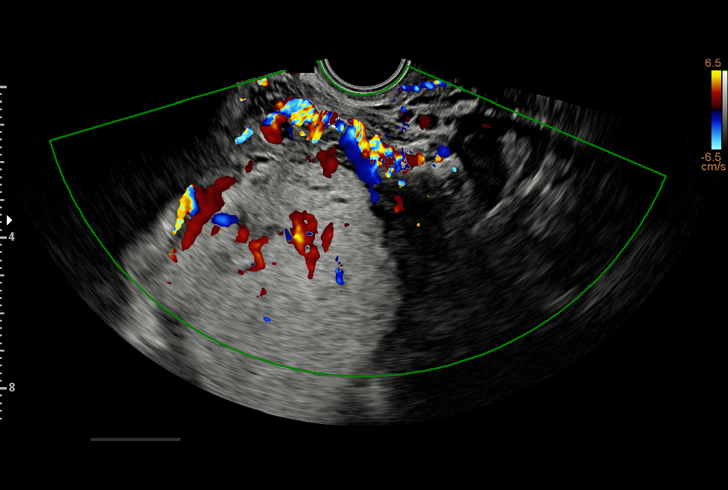

[13 of 28 positions shown; findings below may reference images not displayed]

pm)

Road [HOSPITAL]

Indications

19 weeks gestation of pregnancy
History of cesarean delivery (x3), currently   [XF]
pregnant
Encounter for fetal anatomic survey            [XF]
OB History

Blood Type:            Height:  5'3"   Weight (lb):  118      BMI:
Gravidity:    4         Term:   3
Living:       3
Fetal Evaluation

Num Of Fetuses:     1
Fetal Heart         150
Rate(bpm):
Cardiac Activity:   Observed
Presentation:       Cephalic
Placenta:           Anterior previa
P. Cord Insertion:  Visualized, central

Amniotic Fluid
AFI FV:      Subjectively within normal limits

Largest Pocket(cm)
5.5
Biometry
BPD:      44.1  mm     G. Age:  19w 2d         65  %    CI:        77.74   %   70 - 86
FL/HC:      18.1   %   16.1 -
HC:      158.3  mm     G. Age:  18w 5d         28  %    HC/AC:      1.07       1.09 -
AC:      147.9  mm     G. Age:  20w 1d         79  %    FL/BPD:     65.1   %
FL:       28.7  mm     G. Age:  18w 6d         37  %    FL/AC:      19.4   %   20 - 24
CER:      20.6  mm     G. Age:  19w 4d         64  %
NFT:       3.9  mm
CM:        4.5  mm

Est. FW:     291  gm    0 lb 10 oz      52  %
Gestational Age

U/S Today:     19w 2d                                        EDD:   [DATE]
Best:          19w 0d    Det. By:   Early Ultrasound         EDD:   [DATE]
([DATE])
Anatomy

Cranium:               Appears normal         Aortic Arch:            Appears normal
Cavum:                 Appears normal         Ductal Arch:            Appears normal
Ventricles:            Appears normal         Diaphragm:              Appears normal
Choroid Plexus:        Appears normal         Stomach:                Appears normal, left
sided
Cerebellum:            Appears normal         Abdomen:                Appears normal
Posterior Fossa:       Appears normal         Abdominal Wall:         Appears nml (cord
insert, abd wall)
Nuchal Fold:           Appears normal         Cord Vessels:           Appears normal (3
vessel cord)
Face:                  Appears normal         Kidneys:                Appear normal
(orbits and profile)
Lips:                  Appears normal         Bladder:                Appears normal
Thoracic:              Appears normal         Spine:                  Appears normal
Heart:                 Appears normal         Upper Extremities:      Appears normal
(4CH, axis, and
situs)
RVOT:                  Appears normal         Lower Extremities:      Appears normal
LVOT:                  Appears normal

Other:  Fetus appears to be a female. Heels and 5th digit visualized. Nasal
bone visualized.
Cervix Uterus Adnexa

Cervix
Length:           3.77  cm.
Normal appearance by transabdominal scan.

Uterus
No abnormality visualized.

Left Ovary
Not visualized.

Right Ovary
Not visualized.

Adnexa:       No abnormality visualized.
Impression

SIUP at 19+0 weeks
Normal detailed fetal anatomy
Markers of aneuploidy: none
Normal amniotic fluid volume
Measurements consistent with prior US
Anterior/left lateral placenta; marginal previa
*** findings very suggestive of an abnormally adherent
placenta***

The US findings were shared with Ms. LOCKLEAR. She was in a
hurry so I reviewed the implications of placenta previa and
accreta quickly. She understood the seriousness of the
condition and the possibility of a C-hysterectomy.
Recommendations

Follow-up ultrasound for growth and to reassess the placenta
in 4 weeks

## 2016-10-08 ENCOUNTER — Other Ambulatory Visit (HOSPITAL_COMMUNITY): Payer: Self-pay | Admitting: *Deleted

## 2016-10-08 ENCOUNTER — Other Ambulatory Visit: Payer: Self-pay | Admitting: Certified Nurse Midwife

## 2016-10-08 DIAGNOSIS — O0990 Supervision of high risk pregnancy, unspecified, unspecified trimester: Secondary | ICD-10-CM

## 2016-10-08 DIAGNOSIS — O4402 Placenta previa specified as without hemorrhage, second trimester: Secondary | ICD-10-CM

## 2016-10-08 DIAGNOSIS — O43229 Placenta increta, unspecified trimester: Secondary | ICD-10-CM | POA: Insufficient documentation

## 2016-10-09 ENCOUNTER — Telehealth: Payer: Self-pay

## 2016-10-09 NOTE — Telephone Encounter (Signed)
She already had a talk with the Dr. Patient is in compliance with Dr.'s orders,

## 2016-10-09 NOTE — Telephone Encounter (Signed)
-----   Message from Morene Crocker, CNM sent at 10/08/2016  4:57 PM EDT ----- Please make sure that she is not having sexual intercourse d/t her marginal previa.  Thank you.  R.Denney CNM

## 2016-10-23 ENCOUNTER — Encounter: Payer: Self-pay | Admitting: Obstetrics

## 2016-10-27 ENCOUNTER — Ambulatory Visit (INDEPENDENT_AMBULATORY_CARE_PROVIDER_SITE_OTHER): Payer: Medicaid Other | Admitting: Obstetrics and Gynecology

## 2016-10-27 ENCOUNTER — Encounter: Payer: Self-pay | Admitting: *Deleted

## 2016-10-27 VITALS — BP 116/71 | HR 81 | Wt 128.0 lb

## 2016-10-27 DIAGNOSIS — L280 Lichen simplex chronicus: Secondary | ICD-10-CM

## 2016-10-27 DIAGNOSIS — O4402 Placenta previa specified as without hemorrhage, second trimester: Secondary | ICD-10-CM

## 2016-10-27 DIAGNOSIS — O0990 Supervision of high risk pregnancy, unspecified, unspecified trimester: Secondary | ICD-10-CM

## 2016-10-27 DIAGNOSIS — O34219 Maternal care for unspecified type scar from previous cesarean delivery: Secondary | ICD-10-CM

## 2016-10-27 DIAGNOSIS — Z98891 History of uterine scar from previous surgery: Secondary | ICD-10-CM

## 2016-10-27 DIAGNOSIS — O0992 Supervision of high risk pregnancy, unspecified, second trimester: Secondary | ICD-10-CM
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
## 2016-10-27 NOTE — Progress Notes (Signed)
Patient complains of always being cold, getting cold chills.  Patient declined the FLU Vaccine.

## 2016-10-28 LAB — TSH: TSH: 0.429 u[IU]/mL — AB (ref 0.450–4.500)

## 2016-10-31 ENCOUNTER — Telehealth: Payer: Self-pay | Admitting: *Deleted

## 2016-10-31 NOTE — Telephone Encounter (Signed)
Patient notified- she does have an [place] coming up and she will come over that day. Told her to call the ladies at the front to let them know she is coming for labs.

## 2016-10-31 NOTE — Telephone Encounter (Signed)
-----   Message from Mora Bellman, MD sent at 10/28/2016  2:48 PM EDT ----- Please inform patient of abnormal thyroid test and that further blood testing is necessary. Please draw free T3 and free T4 when she is able to return for blood draw  Thanks  [employer]

## 2016-11-04 ENCOUNTER — Ambulatory Visit (HOSPITAL_COMMUNITY)
Admission: RE | Admit: 2016-11-04 | Discharge: 2016-11-04 | Disposition: A | Discharge status: Home / Self Care | Payer: Medicaid Other | Source: Ambulatory Visit | Attending: Certified Nurse Midwife | Admitting: Certified Nurse Midwife

## 2016-11-04 ENCOUNTER — Other Ambulatory Visit: Payer: Medicaid Other

## 2016-11-04 ENCOUNTER — Other Ambulatory Visit (HOSPITAL_COMMUNITY): Payer: Self-pay | Admitting: Maternal and Fetal Medicine

## 2016-11-04 DIAGNOSIS — O34219 Maternal care for unspecified type scar from previous cesarean delivery: Secondary | ICD-10-CM | POA: Diagnosis not present

## 2016-11-04 DIAGNOSIS — O4402 Placenta previa specified as without hemorrhage, second trimester: Secondary | ICD-10-CM

## 2016-11-04 DIAGNOSIS — Z3A23 23 weeks gestation of pregnancy: Secondary | ICD-10-CM | POA: Insufficient documentation

## 2016-11-04 DIAGNOSIS — O0990 Supervision of high risk pregnancy, unspecified, unspecified trimester: Secondary | ICD-10-CM

## 2016-11-04 IMAGING — US US MFM OB TRANSVAGINAL
1 series · 13 of 28 positions shown · non-contrast
Comparison: none

[Series 1: us mfm ob transvaginal · 78 acquisitions, 13 frames shown]
[im 3/78]
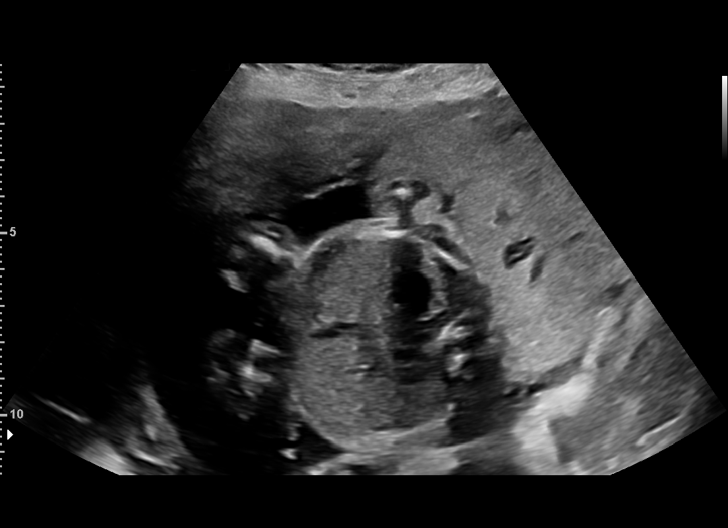
[im 9/78]
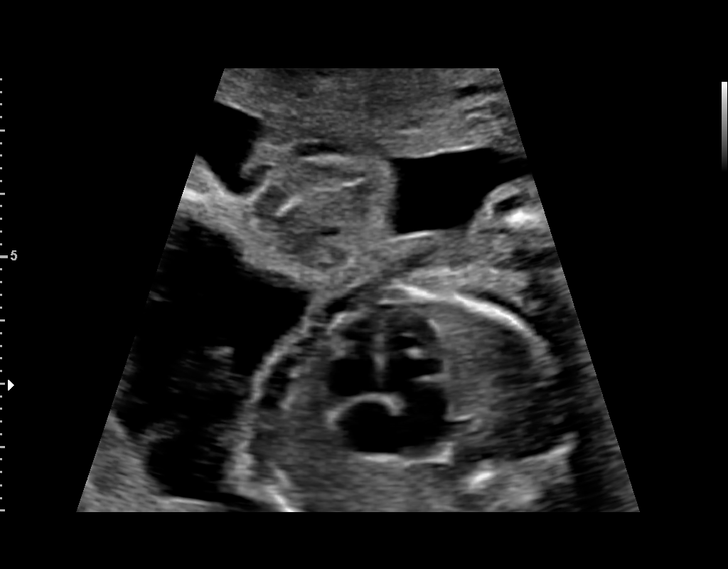
[im 15/78]
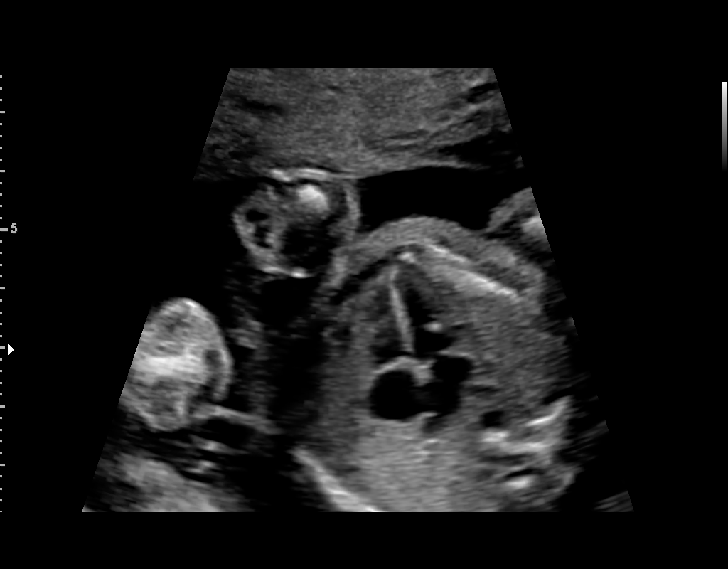
[im 20/78]
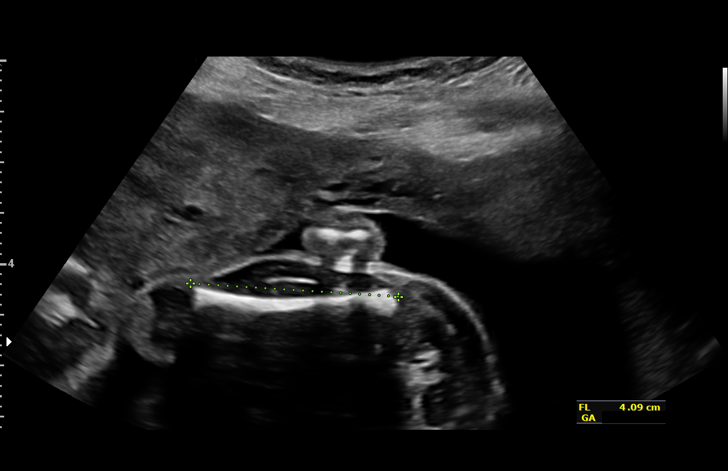
[im 26/78]
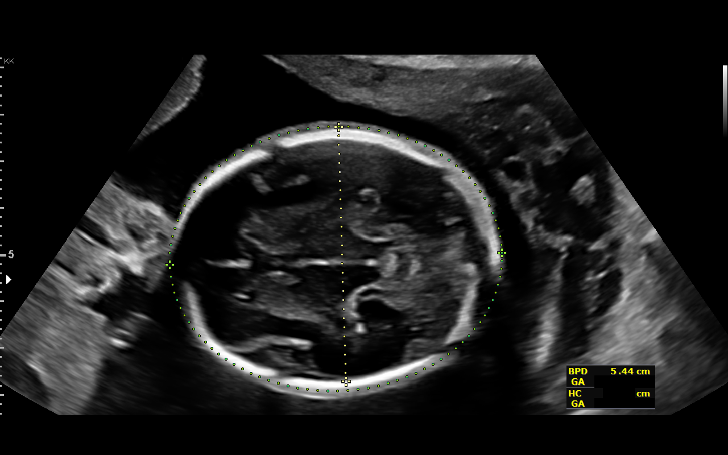
[im 32/78]
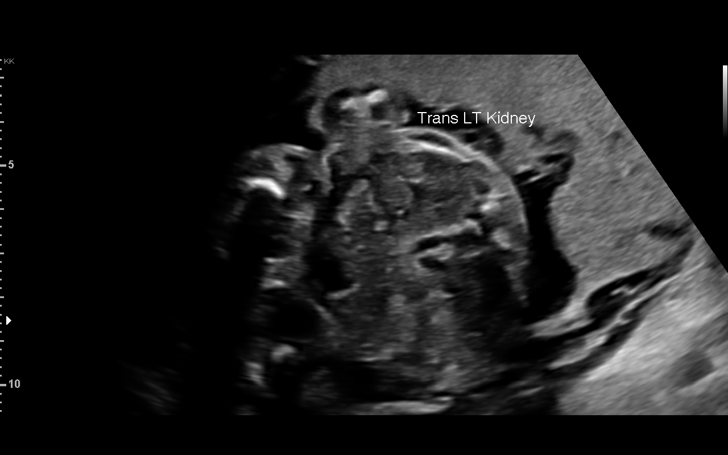
[im 40/78]
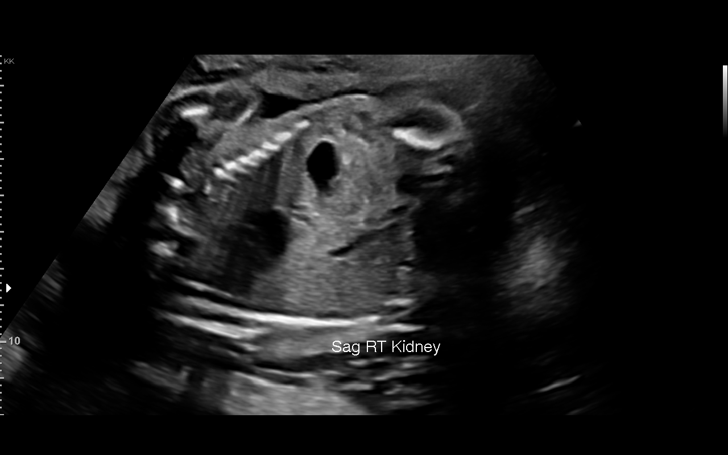
[im 46/78]
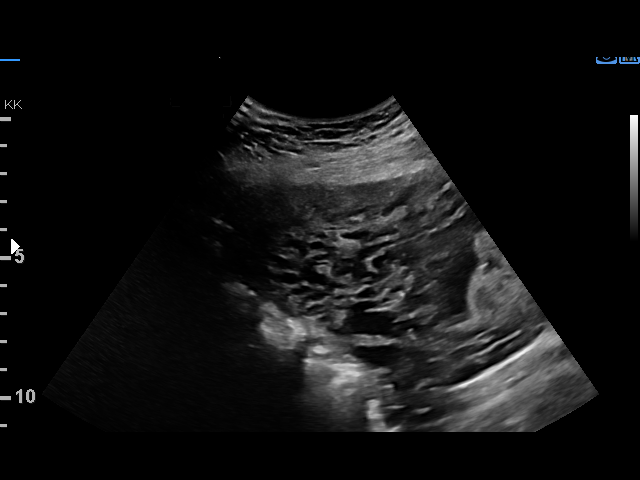
[im 52/78]
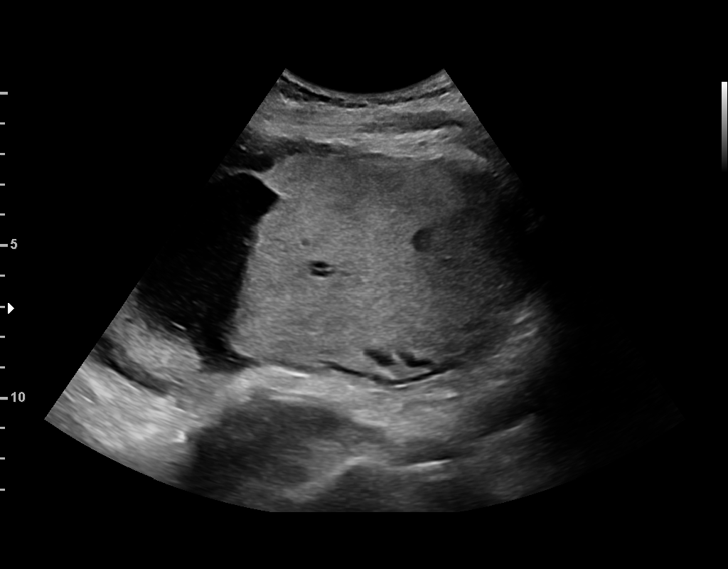
[im 58/78]
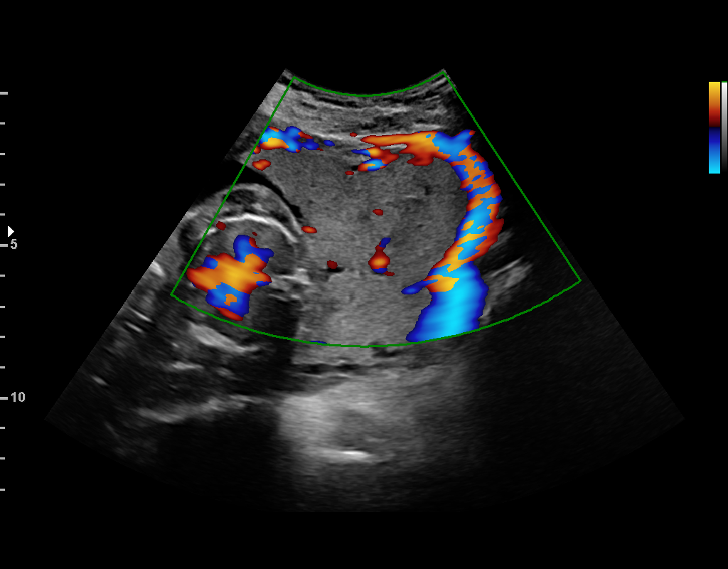
[im 63/78]
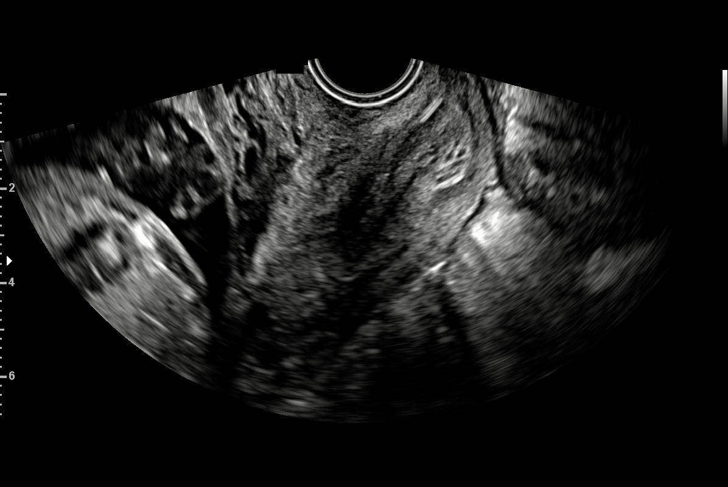
[im 69/78]
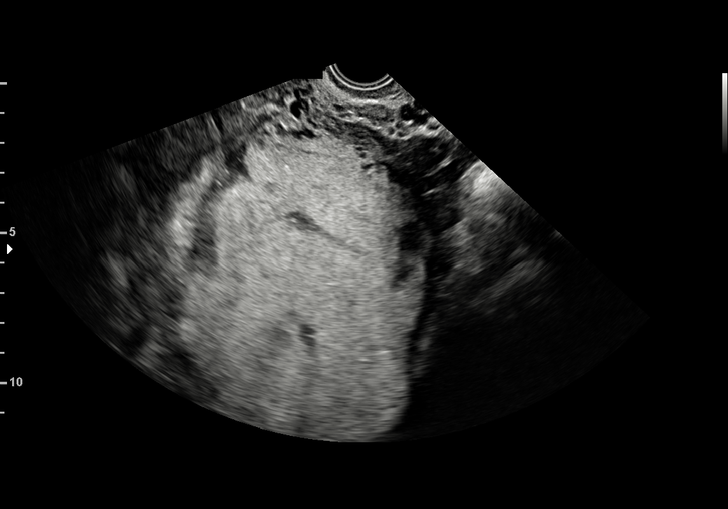
[im 75/78]
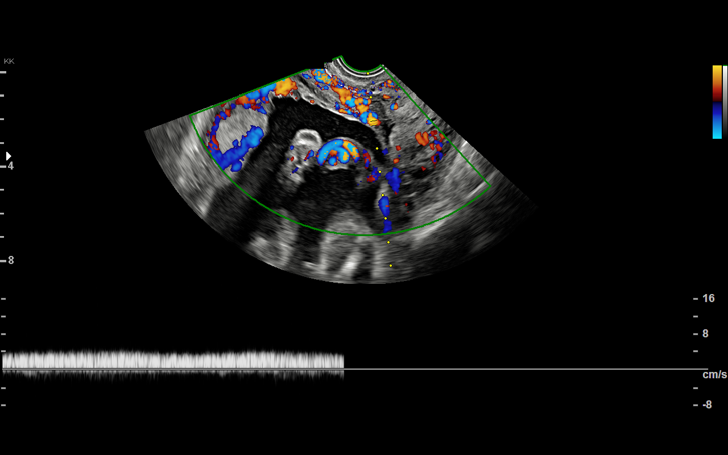

[13 of 28 positions shown; findings below may reference images not displayed]

Road [HOSPITAL]

1  LINNISA            [PHONE_NUMBER]      [PHONE_NUMBER]     [PHONE_NUMBER]
2  LINNISA            [PHONE_NUMBER]      [PHONE_NUMBER]     [PHONE_NUMBER]
Indications

23 weeks gestation of pregnancy
History of cesarean delivery (x3), currently   [E6]
pregnant
Placenta previa specified as without           [E6]
hemorrhage, second trimester
OB History

Blood Type:            Height:  5'3"   Weight (lb):  118      BMI:
Gravidity:    4         Term:   3
Living:       3
Fetal Evaluation

Num Of Fetuses:     1
Fetal Heart         146
Rate(bpm):
Cardiac Activity:   Observed
Presentation:       Frank breech
P. Cord Insertion:  Previously Visualized

Amniotic Fluid
AFI FV:      Subjectively within normal limits

Largest Pocket(cm)
5.6
Biometry
BPD:      54.4  mm     G. Age:  22w 4d         29  %    CI:        72.82   %   70 - 86
FL/HC:      19.6   %   19.2 -
HC:      202.7  mm     G. Age:  22w 3d         16  %    HC/AC:      1.00       1.05 -
AC:      202.2  mm     G. Age:  24w 6d         90  %    FL/BPD:     73.2   %   71 - 87
FL:       39.8  mm     G. Age:  22w 6d         34  %    FL/AC:      19.7   %   20 - 24

Est. FW:     625  gm      1 lb 6 oz     65  %
Gestational Age

U/S Today:     23w 1d                                        EDD:   [DATE]
Best:          23w 0d    Det. By:   Early Ultrasound         EDD:   [DATE]
([DATE])
Anatomy

Cranium:               Appears normal         Aortic Arch:            Previously seen
Cavum:                 Appears normal         Ductal Arch:            Previously seen
Ventricles:            Appears normal         Diaphragm:              Previously seen
Choroid Plexus:        Previously seen        Stomach:                Appears normal, left
sided
Cerebellum:            Appears normal         Abdomen:                Appears normal
Posterior Fossa:       Appears normal         Abdominal Wall:         Previously seen
Nuchal Fold:           Previously seen        Cord Vessels:           Previously seen
Face:                  Orbits and profile     Kidneys:                Appear normal
previously seen
Lips:                  Appears normal         Bladder:                Appears normal
Thoracic:              Appears normal         Spine:                  Previously seen
Heart:                 Previously seen        Upper Extremities:      Previously seen
RVOT:                  Previously seen        Lower Extremities:      Previously seen
LVOT:                  Previously seen

Other:  Fetus appears to be a female. Heels, 5th digit, and Nasal bone
previously visualized.
Cervix Uterus Adnexa

Cervix
Length:            5.3  cm.
Measured transvaginally.
Impression

SIUP at 23+0 weeks
active singleton fetus
EFW 65th%'le
no fetal defects seen today
Normal amniotic fluid volume
Anterior/left lateral placenta; marginal previa
accreta/likely increta
cervix is long and closed

The US findings were shared with Ms. LINNISA.  Today's
images are essentially confirmatory for morbidly adherent
placenta with a number of indicators for accreta
(sonolucenies within the placenta, previa, loss of normal fine
sonolucent border between uterus and placenta in setting of
multiple prior cesarean sections) and some areas where the
placental mass appears to invade the myometrial wall but not
through the wall.  No evidence of percreta today.  She
understood the seriousness of the condition and the need for
planned C-hysterectomy.
Recommendations

Interval growth and reevaluation of placenta in 4 weeks.
If concern for percreta develops (and really only if given that
MRI would likely only add to clinical planning in setting of
suspected percreta), consider MRI.

## 2016-11-05 ENCOUNTER — Other Ambulatory Visit (HOSPITAL_COMMUNITY): Payer: Self-pay | Admitting: *Deleted

## 2016-11-05 DIAGNOSIS — O43229 Placenta increta, unspecified trimester: Secondary | ICD-10-CM

## 2016-11-07 ENCOUNTER — Other Ambulatory Visit: Payer: Medicaid Other

## 2016-11-07 DIAGNOSIS — R7989 Other specified abnormal findings of blood chemistry: Secondary | ICD-10-CM

## 2016-11-08 LAB — T3: T3, Total: 190 ng/dL — ABNORMAL HIGH (ref 71–180)

## 2016-11-08 LAB — T4: T4, Total: 11.3 ug/dL (ref 4.5–12.0)

## 2016-11-11 ENCOUNTER — Other Ambulatory Visit: Payer: Self-pay | Admitting: Obstetrics and Gynecology

## 2016-11-17 ENCOUNTER — Inpatient Hospital Stay (HOSPITAL_COMMUNITY)
Admission: AD | Admit: 2016-11-17 | Discharge: 2016-11-17 | Disposition: A | Discharge status: Home / Self Care | Payer: Medicaid Other | Source: Ambulatory Visit | Attending: Obstetrics and Gynecology | Admitting: Obstetrics and Gynecology

## 2016-11-17 DIAGNOSIS — Z9889 Other specified postprocedural states: Secondary | ICD-10-CM | POA: Insufficient documentation

## 2016-11-17 DIAGNOSIS — Z8759 Personal history of other complications of pregnancy, childbirth and the puerperium: Secondary | ICD-10-CM | POA: Diagnosis not present

## 2016-11-17 DIAGNOSIS — O4702 False labor before 37 completed weeks of gestation, second trimester: Secondary | ICD-10-CM

## 2016-11-17 DIAGNOSIS — Z8544 Personal history of malignant neoplasm of other female genital organs: Secondary | ICD-10-CM | POA: Diagnosis not present

## 2016-11-17 DIAGNOSIS — Z8042 Family history of malignant neoplasm of prostate: Secondary | ICD-10-CM | POA: Diagnosis not present

## 2016-11-17 DIAGNOSIS — O43222 Placenta increta, second trimester: Secondary | ICD-10-CM | POA: Insufficient documentation

## 2016-11-17 DIAGNOSIS — Z885 Allergy status to narcotic agent status: Secondary | ICD-10-CM | POA: Insufficient documentation

## 2016-11-17 DIAGNOSIS — Z87891 Personal history of nicotine dependence: Secondary | ICD-10-CM | POA: Diagnosis not present

## 2016-11-17 DIAGNOSIS — R109 Unspecified abdominal pain: Secondary | ICD-10-CM | POA: Diagnosis present

## 2016-11-17 DIAGNOSIS — Z803 Family history of malignant neoplasm of breast: Secondary | ICD-10-CM | POA: Diagnosis not present

## 2016-11-17 DIAGNOSIS — Z3A24 24 weeks gestation of pregnancy: Secondary | ICD-10-CM | POA: Diagnosis not present

## 2016-11-17 LAB — CBC
HCT: 31.9 % — ABNORMAL LOW (ref 36.0–46.0)
HEMOGLOBIN: 11.6 g/dL — AB (ref 12.0–15.0)
MCH: 33.4 pg (ref 26.0–34.0)
MCHC: 36.4 g/dL — ABNORMAL HIGH (ref 30.0–36.0)
MCV: 91.9 fL (ref 78.0–100.0)
PLATELETS: 219 10*3/uL (ref 150–400)
RBC: 3.47 MIL/uL — AB (ref 3.87–5.11)
RDW: 15.2 % (ref 11.5–15.5)
WBC: 7.1 10*3/uL (ref 4.0–10.5)

## 2016-11-17 LAB — URINALYSIS, ROUTINE W REFLEX MICROSCOPIC
BILIRUBIN URINE: NEGATIVE
Glucose, UA: NEGATIVE mg/dL
Hgb urine dipstick: NEGATIVE
Ketones, ur: 20 mg/dL — AB
Leukocytes, UA: NEGATIVE
NITRITE: NEGATIVE
Protein, ur: NEGATIVE mg/dL
SPECIFIC GRAVITY, URINE: 1.006 (ref 1.005–1.030)
pH: 8 (ref 5.0–8.0)

## 2016-11-17 LAB — TYPE AND SCREEN
ABO/RH(D): O POS
Antibody Screen: NEGATIVE

## 2016-11-17 LAB — WET PREP, GENITAL
Clue Cells Wet Prep HPF POC: NONE SEEN
SPERM: NONE SEEN
TRICH WET PREP: NONE SEEN
YEAST WET PREP: NONE SEEN

## 2016-11-17 MED ORDER — LACTATED RINGERS IV SOLN
INTRAVENOUS | Status: DC
  Administered 2016-11-17: 17:00:00 via INTRAVENOUS

## 2016-11-17 MED ORDER — NIFEDIPINE 10 MG PO CAPS
10.0000 mg | ORAL_CAPSULE | ORAL | Status: AC
  Administered 2016-11-17 (×3): 10 mg via ORAL
  Filled 2016-11-17 (×3): qty 1

## 2016-11-17 MED ORDER — LACTATED RINGERS IV BOLUS (SEPSIS)
1000.0000 mL | Freq: Once | INTRAVENOUS | Status: AC
  Administered 2016-11-17: 1000 mL via INTRAVENOUS
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
## 2016-11-17 NOTE — Discharge Instructions (Signed)
Braxton Hicks Contractions °Contractions of the uterus can occur throughout pregnancy, but they are not always a sign that you are in labor. You may have practice contractions called Braxton Hicks contractions. These false labor contractions are sometimes confused with true labor. °What are Braxton Hicks contractions? °Braxton Hicks contractions are tightening movements that occur in the muscles of the uterus before labor. Unlike true labor contractions, these contractions do not result in opening (dilation) and thinning of the cervix. Toward the end of pregnancy (32-34 weeks), Braxton Hicks contractions can happen more often and may become stronger. These contractions are sometimes difficult to tell apart from true labor because they can be very uncomfortable. You should not feel embarrassed if you go to the hospital with false labor. °Sometimes, the only way to tell if you are in true labor is for your health care provider to look for changes in the cervix. The health care provider will do a physical exam and may monitor your contractions. If you are not in true labor, the exam should show that your cervix is not dilating and your water has not broken. °If there are no prenatal problems or other health problems associated with your pregnancy, it is completely safe for you to be sent home with false labor. You may continue to have Braxton Hicks contractions until you go into true labor. °How can I tell the difference between true labor and false labor? °· Differences °? False labor °? Contractions last 30-70 seconds.: Contractions are usually shorter and not as strong as true labor contractions. °? Contractions become very regular.: Contractions are usually irregular. °? Discomfort is usually felt in the top of the uterus, and it spreads to the lower abdomen and low back.: Contractions are often felt in the front of the lower abdomen and in the groin. °? Contractions do not go away with walking.: Contractions may  go away when you walk around or change positions while lying down. °? Contractions usually become more intense and increase in frequency.: Contractions get weaker and are shorter-lasting as time goes on. °? The cervix dilates and gets thinner.: The cervix usually does not dilate or become thin. °Follow these instructions at home: °· Take over-the-counter and prescription medicines only as told by your health care provider. °· Keep up with your usual exercises and follow other instructions from your health care provider. °· Eat and drink lightly if you think you are going into labor. °· If Braxton Hicks contractions are making you uncomfortable: °? Change your position from lying down or resting to walking, or change from walking to resting. °? Sit and rest in a tub of warm water. °? Drink enough fluid to keep your urine clear or pale yellow. Dehydration may cause these contractions. °? Do slow and deep breathing several times an hour. °· Keep all follow-up prenatal visits as told by your health care provider. This is important. °Contact a health care provider if: °· You have a fever. °· You have continuous pain in your abdomen. °Get help right away if: °· Your contractions become stronger, more regular, and closer together. °· You have fluid leaking or gushing from your vagina. °· You pass blood-tinged mucus (bloody show). °· You have bleeding from your vagina. °· You have low back pain that you never had before. °· You feel your baby’s head pushing down and causing pelvic pressure. °· Your baby is not moving inside you as much as it used to. °Summary °· Contractions that occur before labor are   called Braxton Hicks contractions, false labor, or practice contractions.  Braxton Hicks contractions are usually shorter, weaker, farther apart, and less regular than true labor contractions. True labor contractions usually become progressively stronger and regular and they become more frequent.  Manage discomfort from  [facility] contractions by changing position, resting in a warm bath, drinking plenty of water, or practicing deep breathing. This information is not intended to replace advice given to you by your health care provider. Make sure you discuss any questions you have with your health care provider. Document Released: 02/03/2005 Document Revised: 12/24/2015 Document Reviewed: 12/24/2015 Elsevier Interactive Patient Education  2017 [employer].   Pelvic Rest Pelvic rest may be recommended if:  Your placenta is partially or completely covering the opening of your cervix (placenta previa).  There is bleeding between the wall of the uterus and the amniotic sac in the first trimester of pregnancy (subchorionic hemorrhage).  You went into labor too early (preterm labor).  Based on your overall health and the health of your baby, your health care provider will decide if pelvic rest is right for you. How do I rest my pelvis? For as long as told by your health care provider:  Do not have sex, sexual stimulation, or an orgasm.  Do not use tampons. Do not douche. Do not put anything in your vagina.  Do not lift anything that is heavier than 10 lb (4.5 kg).  Avoid activities that take a lot of effort (are strenuous).  Avoid any activity in which your pelvic muscles could become strained.  When should I seek medical care? Seek medical care if you have:  Cramping pain in your lower abdomen.  Vaginal discharge.  A low, dull backache.  Regular contractions.  Uterine tightening.  When should I seek immediate medical care? Seek immediate medical care if:  You have vaginal bleeding and you are pregnant.  This information is not intended to replace advice given to you by your health care provider. Make sure you discuss any questions you have with your health care provider. Document Released: 05/31/2010 Document Revised: 07/12/2015 Document Reviewed: 08/07/2014 Elsevier Interactive  Patient Education  [employer].

## 2016-11-17 NOTE — Progress Notes (Signed)
Report and care of pt assumed @ this time.

## 2016-11-17 NOTE — Progress Notes (Signed)
Pt denies uterine ctxs.

## 2016-11-18 LAB — GC/CHLAMYDIA PROBE AMP (~~LOC~~) NOT AT ARMC
Chlamydia: NEGATIVE
NEISSERIA GONORRHEA: NEGATIVE

## 2016-11-18 LAB — HIV ANTIBODY (ROUTINE TESTING W REFLEX): HIV SCREEN 4TH GENERATION: NONREACTIVE

## 2016-11-24 ENCOUNTER — Ambulatory Visit (INDEPENDENT_AMBULATORY_CARE_PROVIDER_SITE_OTHER): Payer: Medicaid Other | Admitting: Obstetrics and Gynecology

## 2016-11-24 VITALS — BP 127/71 | HR 87 | Wt 130.7 lb

## 2016-11-24 DIAGNOSIS — Z98891 History of uterine scar from previous surgery: Secondary | ICD-10-CM

## 2016-11-24 DIAGNOSIS — F419 Anxiety disorder, unspecified: Secondary | ICD-10-CM

## 2016-11-24 DIAGNOSIS — O0992 Supervision of high risk pregnancy, unspecified, second trimester: Secondary | ICD-10-CM

## 2016-11-24 DIAGNOSIS — O4402 Placenta previa specified as without hemorrhage, second trimester: Secondary | ICD-10-CM

## 2016-11-24 DIAGNOSIS — O0990 Supervision of high risk pregnancy, unspecified, unspecified trimester: Secondary | ICD-10-CM
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
## 2016-11-24 NOTE — Progress Notes (Signed)
Patient reports good fetal movement and states that she has pressure and irregular contractions when she is on her feet for too long.

## 2016-11-27 ENCOUNTER — Institutional Professional Consult (permissible substitution): Payer: Medicaid Other

## 2016-12-01 ENCOUNTER — Ambulatory Visit (INDEPENDENT_AMBULATORY_CARE_PROVIDER_SITE_OTHER): Payer: Medicaid Other | Admitting: Clinical

## 2016-12-01 ENCOUNTER — Ambulatory Visit (INDEPENDENT_AMBULATORY_CARE_PROVIDER_SITE_OTHER): Payer: Medicaid Other | Admitting: Obstetrics and Gynecology

## 2016-12-01 ENCOUNTER — Other Ambulatory Visit: Payer: Medicaid Other

## 2016-12-01 VITALS — BP 118/73 | HR 90 | Wt 134.5 lb

## 2016-12-01 DIAGNOSIS — Z23 Encounter for immunization: Secondary | ICD-10-CM | POA: Diagnosis not present

## 2016-12-01 DIAGNOSIS — O0992 Supervision of high risk pregnancy, unspecified, second trimester: Secondary | ICD-10-CM

## 2016-12-01 DIAGNOSIS — Z3009 Encounter for other general counseling and advice on contraception: Secondary | ICD-10-CM

## 2016-12-01 DIAGNOSIS — O0990 Supervision of high risk pregnancy, unspecified, unspecified trimester: Secondary | ICD-10-CM

## 2016-12-01 DIAGNOSIS — O24419 Gestational diabetes mellitus in pregnancy, unspecified control: Secondary | ICD-10-CM

## 2016-12-01 DIAGNOSIS — Z98891 History of uterine scar from previous surgery: Secondary | ICD-10-CM

## 2016-12-01 DIAGNOSIS — F32A Depression, unspecified: Secondary | ICD-10-CM | POA: Insufficient documentation

## 2016-12-01 DIAGNOSIS — F329 Major depressive disorder, single episode, unspecified: Secondary | ICD-10-CM

## 2016-12-01 DIAGNOSIS — F419 Anxiety disorder, unspecified: Secondary | ICD-10-CM

## 2016-12-01 DIAGNOSIS — F4323 Adjustment disorder with mixed anxiety and depressed mood: Secondary | ICD-10-CM

## 2016-12-01 DIAGNOSIS — O43222 Placenta increta, second trimester: Secondary | ICD-10-CM

## 2016-12-01 NOTE — Patient Instructions (Signed)
My Safety Plan:   Step 1: Warning signs (thoughts, images, mood, situation, behavior) that a crisis may be developing: My thoughts, if I feel panicky and scared  Step 2: Internal coping strategies: Things I can do to take my mind off my problems without contacting another person (music, relaxation technique, physical activity): Listen to music, get out of the house  Step 3: People I can ask for help:  Name, relationship, contact: Mom, [phone]  Step 5: Agencies I can contact during a crisis:      1. 9-1-1     2. [employer] (24/7 walk-in) [address] [place], [place]     3. [place]: Intake- [id number] [phone]     4. Closest Emergency Room Address: [facility] (see below)  Suicide Prevention Lifeline Phone: [phone]  Step 6: Making the environment safe- Have friend or family remove from home: Mom will do this for me * Weapons in the home * Medication in the home (including Tylenol)  Step 7: The one thing that is most important to me and worth living for is: My kids  Signature of Patient: _________________________________________________  Signature of Provider: ________________________________________________  [facility] [place], [place], [place]. [facility] [address], [place], [place]  [facility] [address], [place], [place]  [facility] [address] [phone]  [facility] [address] [place], [place]
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
## 2016-12-02 ENCOUNTER — Ambulatory Visit (HOSPITAL_COMMUNITY)
Admission: RE | Admit: 2016-12-02 | Discharge: 2016-12-02 | Disposition: A | Discharge status: Home / Self Care | Payer: Medicaid Other | Source: Ambulatory Visit | Attending: Certified Nurse Midwife | Admitting: Certified Nurse Midwife

## 2016-12-02 ENCOUNTER — Other Ambulatory Visit (HOSPITAL_COMMUNITY): Payer: Self-pay | Admitting: Obstetrics and Gynecology

## 2016-12-02 ENCOUNTER — Encounter (HOSPITAL_COMMUNITY): Payer: Self-pay

## 2016-12-02 DIAGNOSIS — O43222 Placenta increta, second trimester: Secondary | ICD-10-CM

## 2016-12-02 DIAGNOSIS — Z3A27 27 weeks gestation of pregnancy: Secondary | ICD-10-CM

## 2016-12-02 DIAGNOSIS — O4402 Placenta previa specified as without hemorrhage, second trimester: Secondary | ICD-10-CM

## 2016-12-02 DIAGNOSIS — O34219 Maternal care for unspecified type scar from previous cesarean delivery: Secondary | ICD-10-CM | POA: Diagnosis not present

## 2016-12-02 DIAGNOSIS — Z362 Encounter for other antenatal screening follow-up: Secondary | ICD-10-CM | POA: Insufficient documentation

## 2016-12-02 DIAGNOSIS — O0990 Supervision of high risk pregnancy, unspecified, unspecified trimester: Secondary | ICD-10-CM

## 2016-12-02 DIAGNOSIS — O43229 Placenta increta, unspecified trimester: Secondary | ICD-10-CM

## 2016-12-02 LAB — T4, FREE: Free T4: 0.97 ng/dL (ref 0.82–1.77)

## 2016-12-02 LAB — CBC
HEMATOCRIT: 30.5 % — AB (ref 34.0–46.6)
HEMOGLOBIN: 10.9 g/dL — AB (ref 11.1–15.9)
MCH: 31.9 pg (ref 26.6–33.0)
MCHC: 35.7 g/dL (ref 31.5–35.7)
MCV: 89 fL (ref 79–97)
Platelets: 237 10*3/uL (ref 150–379)
RBC: 3.42 x10E6/uL — AB (ref 3.77–5.28)
RDW: 15.1 % (ref 12.3–15.4)
WBC: 5.5 10*3/uL (ref 3.4–10.8)

## 2016-12-02 LAB — HIV ANTIBODY (ROUTINE TESTING W REFLEX): HIV SCREEN 4TH GENERATION: NONREACTIVE

## 2016-12-02 LAB — GLUCOSE TOLERANCE, 2 HOURS W/ 1HR
GLUCOSE, 2 HOUR: 171 mg/dL — AB (ref 65–152)
Glucose, 1 hour: 197 mg/dL — ABNORMAL HIGH (ref 65–179)
Glucose, Fasting: 74 mg/dL (ref 65–91)

## 2016-12-02 LAB — RPR: RPR: NONREACTIVE

## 2016-12-02 LAB — TSH: TSH: 0.677 u[IU]/mL (ref 0.450–4.500)

## 2016-12-02 LAB — T3, FREE: T3 FREE: 2.5 pg/mL (ref 2.0–4.4)

## 2016-12-02 IMAGING — US US MFM OB TRANSVAGINAL
1 series · 14 of 28 positions shown · non-contrast
Comparison: none

[Series 1: us mfm ob transvaginal · 62 acquisitions, 14 frames shown]
[im 3/62]
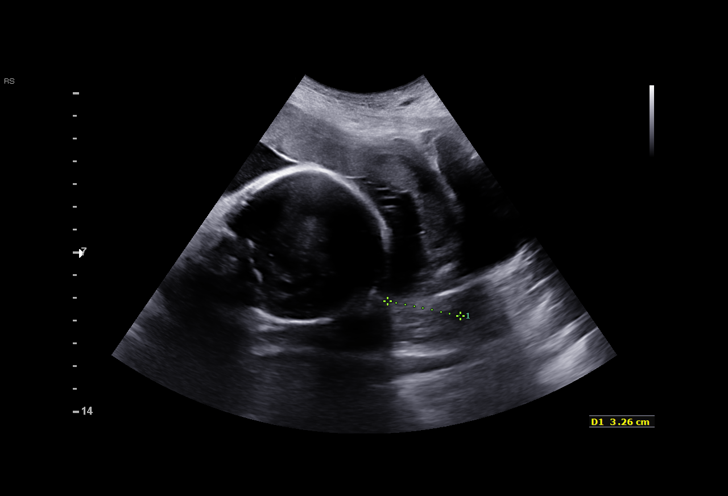
[im 7/62]
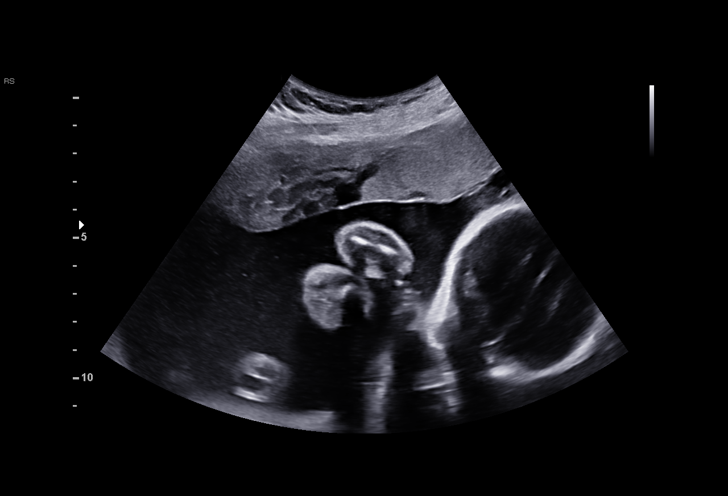
[im 12/62]
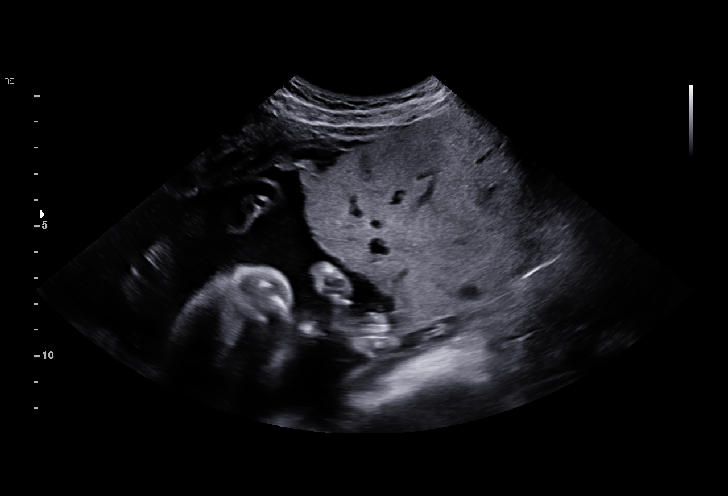
[im 16/62]
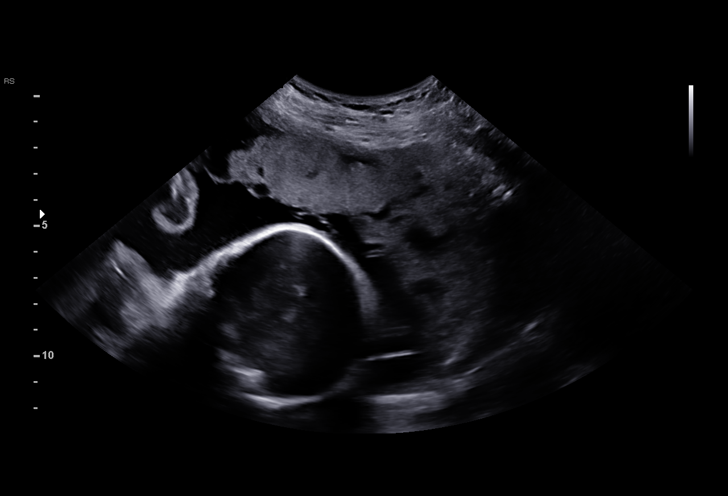
[im 21/62]
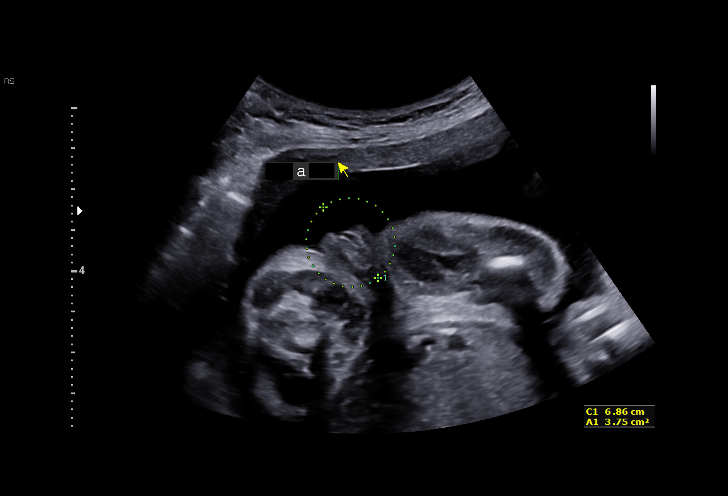
[im 25/62]
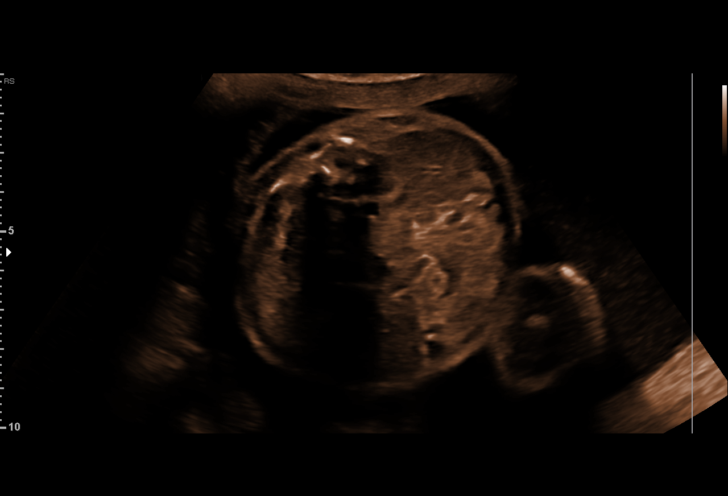
[im 30/62]
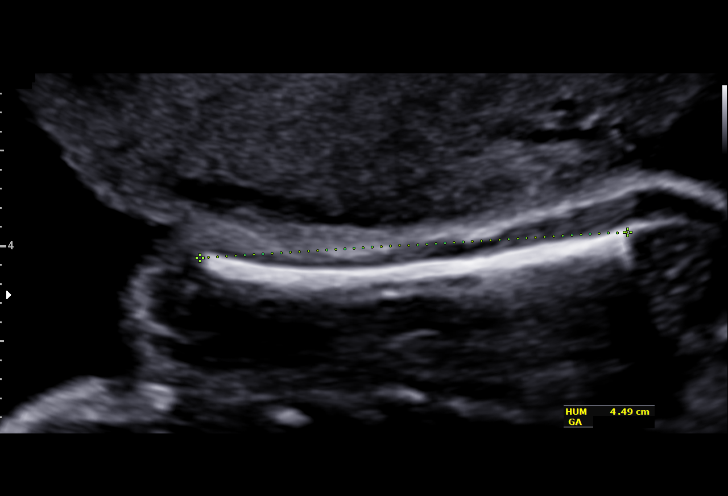
[im 34/62]
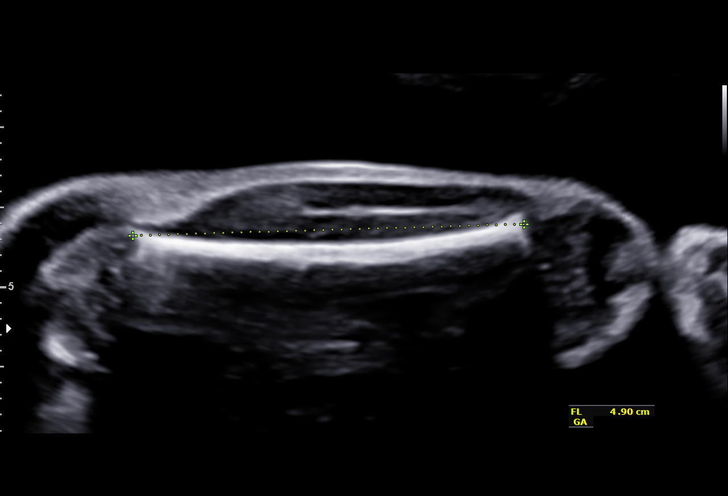
[im 39/62]
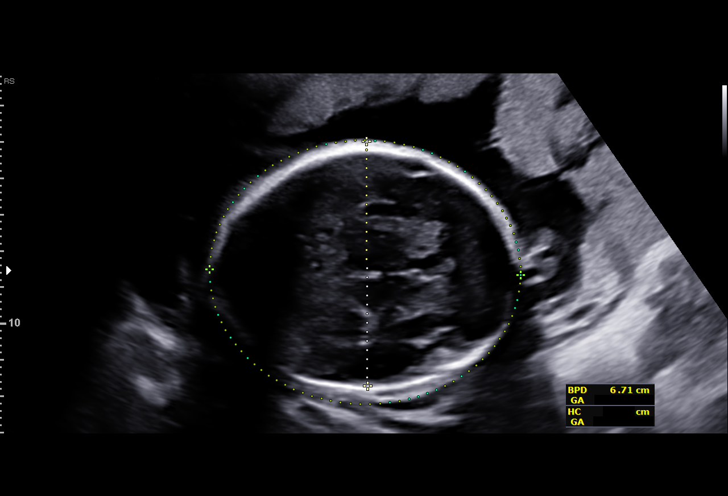
[im 43/62]
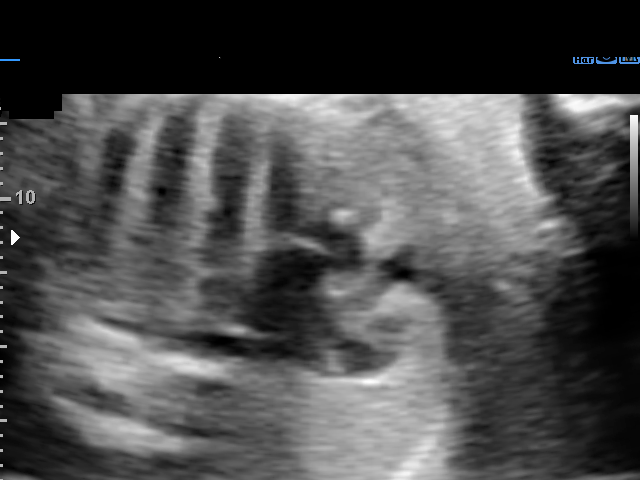
[im 48/62]
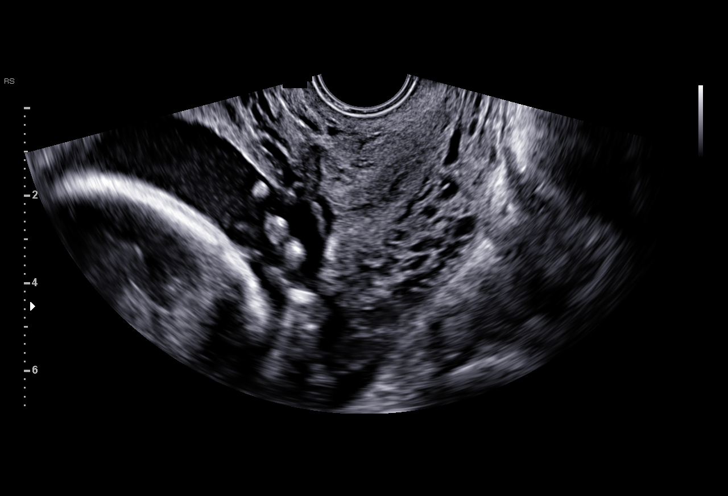
[im 52/62]
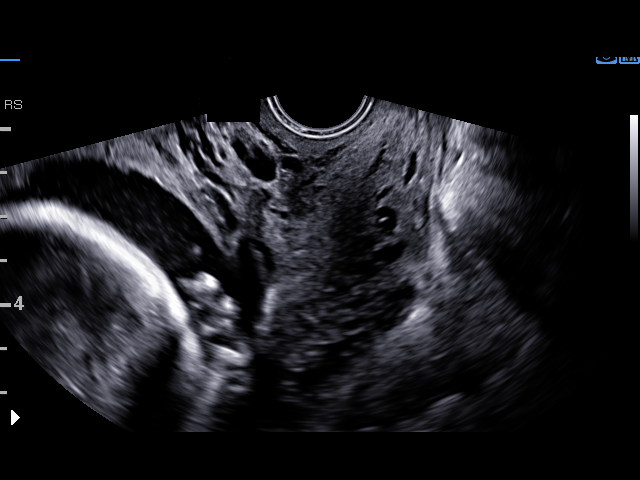
[im 57/62]
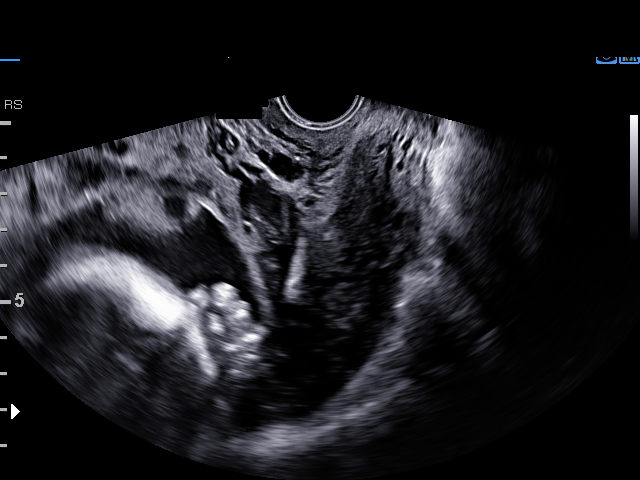
[im 62/62]
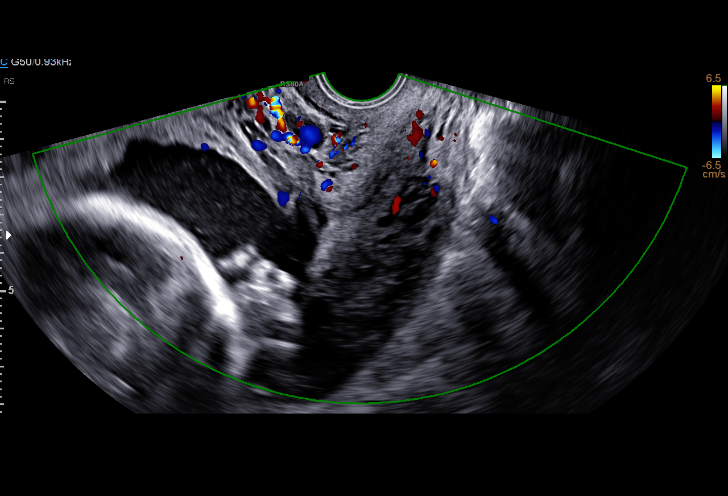

[14 of 28 positions shown; findings below may reference images not displayed]

Road [HOSPITAL]

Indications

27 weeks gestation of pregnancy
History of cesarean delivery (x3), currently   [DP]
pregnant
Placenta previa specified as without           [DP]
hemorrhage, second trimester
Encounter for other antenatal screening        [DP]
follow-up
Placenta increta, antepartum                   [DP]
OB History

Blood Type:            Height:  5'3"   Weight (lb):  118       BMI:
Gravidity:    4         Term:   3
Living:       3
Fetal Evaluation

Num Of Fetuses:     1
Fetal Heart         137
Rate(bpm):
Cardiac Activity:   Observed
Presentation:       Cephalic
Placenta:           Anterior previa
P. Cord Insertion:  Previously Visualized

Amniotic Fluid
AFI FV:      Subjectively within normal limits

Largest Pocket(cm)
6.93
Biometry

BPD:      66.2  mm     G. Age:  26w 5d         29  %    CI:        70.91   %    70 - 86
FL/HC:      19.6   %    18.6 -
HC:      250.5  mm     G. Age:  27w 1d         29  %    HC/AC:      1.00        1.05 -
AC:      251.2  mm     G. Age:  29w 2d         94  %    FL/BPD:     74.3   %    71 - 87
FL:       49.2  mm     G. Age:  26w 4d         23  %    FL/AC:      19.6   %    20 - 24
HUM:      44.9  mm     G. Age:  26w 4d         38  %

Est. FW:    [DP]  gm      2 lb 9 oz     75  %
Gestational Age

U/S Today:     27w 3d                                        EDD:   [DATE]
Best:          27w 0d     Det. By:  Early Ultrasound         EDD:   [DATE]
([DATE])
Anatomy

Cranium:               Appears normal         Aortic Arch:            Previously seen
Cavum:                 Previously seen        Ductal Arch:            Previously seen
Ventricles:            Previously seen        Diaphragm:              Previously seen
Choroid Plexus:        Previously seen        Stomach:                Appears normal, left
sided
Cerebellum:            Previously seen        Abdomen:                Appears normal
Posterior Fossa:       Previously seen        Abdominal Wall:         Previously seen
Nuchal Fold:           Previously seen        Cord Vessels:           Previously seen
Face:                  Orbits and profile     Kidneys:                Appear normal
previously seen
Lips:                  Previously seen        Bladder:                Appears normal
Thoracic:              Appears normal         Spine:                  Previously seen
Heart:                 Previously seen        Upper Extremities:      Previously seen
RVOT:                  Previously seen        Lower Extremities:      Previously seen
LVOT:                  Previously seen

Other:  Fetus appears to be a female. Heels, 5th digit, and Nasal bone
previously visualized.
Cervix Uterus Adnexa

Cervix
Length:            4.1  cm.
Measured transvaginally.

Uterus
No abnormality visualized.

Left Ovary
Not visualized.

Right Ovary
Not visualized.

Adnexa:       No abnormality visualized. No adnexal mass
visualized.
Impression

Singleton intrauterine pregnancy at 27 weeks 0 days gestation
Cephalic presentation
Anterior placenta previa suspected abnormally adherent
placenta
Normal appearing fetal growth and amniotic fluid volume
Normal appearing cervical length
Recommendations

Recommend follow-up ultrasound examination in 
 4 weeks
scheduled
Will set up to be evaluated by Gyn Onc at [facility]

## 2016-12-04 ENCOUNTER — Telehealth: Payer: Self-pay

## 2016-12-04 ENCOUNTER — Other Ambulatory Visit: Payer: Self-pay

## 2016-12-04 MED ORDER — ACCU-CHEK FASTCLIX LANCETS MISC: 1.0000 | Freq: Four times a day (QID) | 12 refills | Status: DC

## 2016-12-04 MED ORDER — GLUCOSE BLOOD VI STRP: ORAL_STRIP | 12 refills | Status: DC

## 2016-12-04 MED ORDER — ACCU-CHEK GUIDE W/DEVICE KIT: 1.0000 | PACK | Freq: Once | 0 refills | Status: AC

## 2016-12-04 NOTE — Addendum Note (Signed)
Addended by: Tamela Oddi on: 12/04/2016 11:32 AM   Modules accepted: Orders

## 2016-12-04 NOTE — Telephone Encounter (Signed)
-----   Message from Chancy Milroy, MD sent at 12/03/2016  3:37 PM EDT ----- Please let pt know that she failed her Glucola test and has GDM Please refer for GDM education Thanks Legrand Como

## 2016-12-04 NOTE — Telephone Encounter (Signed)
Patient notified of results, referral and Rx.  Verbalized understanding.

## 2016-12-17 ENCOUNTER — Encounter: Payer: Medicaid Other | Attending: Obstetrics and Gynecology | Admitting: Registered"

## 2016-12-17 DIAGNOSIS — Z3A00 Weeks of gestation of pregnancy not specified: Secondary | ICD-10-CM | POA: Insufficient documentation

## 2016-12-17 DIAGNOSIS — O24419 Gestational diabetes mellitus in pregnancy, unspecified control: Secondary | ICD-10-CM | POA: Diagnosis present

## 2016-12-17 DIAGNOSIS — R7309 Other abnormal glucose: Secondary | ICD-10-CM

## 2016-12-17 DIAGNOSIS — Z713 Dietary counseling and surveillance: Secondary | ICD-10-CM | POA: Insufficient documentation

## 2016-12-17 DIAGNOSIS — O0990 Supervision of high risk pregnancy, unspecified, unspecified trimester: Secondary | ICD-10-CM | POA: Insufficient documentation

## 2016-12-17 NOTE — Progress Notes (Signed)
Patient was seen on 12/17/16 for Gestational Diabetes self-management class at [facility]. The following learning objectives were met by the patient during this course:   States the definition of Gestational Diabetes  States why dietary management is important in controlling blood glucose  Describes the effects each nutrient has on blood glucose levels  Demonstrates ability to create a balanced meal plan  Demonstrates carbohydrate counting   States when to check blood glucose levels  Demonstrates proper blood glucose monitoring techniques  States the effect of stress and exercise on blood glucose levels  States the importance of limiting caffeine and abstaining from alcohol and smoking  Blood glucose monitor given: none Lot # n/a Exp: n/a Blood glucose reading: n/a  Patient instructed to monitor glucose levels: FBS: 60 - <95 1 hour: <140 2 hour: <120  Patient received handouts:  Nutrition Diabetes and Pregnancy  Carbohydrate Counting List  Patient will be seen for follow-up as needed.

## 2016-12-19 ENCOUNTER — Encounter: Payer: Self-pay | Admitting: Registered"

## 2016-12-19 ENCOUNTER — Encounter (HOSPITAL_COMMUNITY): Payer: Self-pay

## 2016-12-19 ENCOUNTER — Inpatient Hospital Stay (HOSPITAL_COMMUNITY)
Admission: AD | Admit: 2016-12-19 | Discharge: 2016-12-21 | DRG: 776 | Disposition: A | Discharge status: Home / Self Care | Payer: Medicaid Other | Source: Ambulatory Visit | Attending: Obstetrics and Gynecology | Admitting: Obstetrics and Gynecology

## 2016-12-19 DIAGNOSIS — O26893 Other specified pregnancy related conditions, third trimester: Secondary | ICD-10-CM | POA: Diagnosis not present

## 2016-12-19 DIAGNOSIS — O43223 Placenta increta, third trimester: Principal | ICD-10-CM | POA: Diagnosis present

## 2016-12-19 DIAGNOSIS — O0993 Supervision of high risk pregnancy, unspecified, third trimester: Secondary | ICD-10-CM | POA: Diagnosis not present

## 2016-12-19 DIAGNOSIS — O34211 Maternal care for low transverse scar from previous cesarean delivery: Secondary | ICD-10-CM | POA: Diagnosis present

## 2016-12-19 DIAGNOSIS — Z98891 History of uterine scar from previous surgery: Secondary | ICD-10-CM

## 2016-12-19 DIAGNOSIS — Z87891 Personal history of nicotine dependence: Secondary | ICD-10-CM | POA: Diagnosis not present

## 2016-12-19 DIAGNOSIS — O1203 Gestational edema, third trimester: Secondary | ICD-10-CM

## 2016-12-19 DIAGNOSIS — Z3A29 29 weeks gestation of pregnancy: Secondary | ICD-10-CM

## 2016-12-19 DIAGNOSIS — R609 Edema, unspecified: Secondary | ICD-10-CM | POA: Diagnosis not present

## 2016-12-19 DIAGNOSIS — O0990 Supervision of high risk pregnancy, unspecified, unspecified trimester: Secondary | ICD-10-CM

## 2016-12-19 DIAGNOSIS — M79661 Pain in right lower leg: Secondary | ICD-10-CM

## 2016-12-19 DIAGNOSIS — O24415 Gestational diabetes mellitus in pregnancy, controlled by oral hypoglycemic drugs: Secondary | ICD-10-CM | POA: Diagnosis not present

## 2016-12-19 DIAGNOSIS — M79662 Pain in left lower leg: Secondary | ICD-10-CM

## 2016-12-19 DIAGNOSIS — M79669 Pain in unspecified lower leg: Secondary | ICD-10-CM | POA: Diagnosis present

## 2016-12-19 DIAGNOSIS — O43229 Placenta increta, unspecified trimester: Secondary | ICD-10-CM | POA: Diagnosis present

## 2016-12-19 DIAGNOSIS — Z7901 Long term (current) use of anticoagulants: Secondary | ICD-10-CM

## 2016-12-19 DIAGNOSIS — R102 Pelvic and perineal pain: Secondary | ICD-10-CM | POA: Diagnosis not present

## 2016-12-19 DIAGNOSIS — Z86718 Personal history of other venous thrombosis and embolism: Secondary | ICD-10-CM

## 2016-12-19 DIAGNOSIS — Z3009 Encounter for other general counseling and advice on contraception: Secondary | ICD-10-CM | POA: Diagnosis present

## 2016-12-19 DIAGNOSIS — O24419 Gestational diabetes mellitus in pregnancy, unspecified control: Secondary | ICD-10-CM | POA: Diagnosis present

## 2016-12-19 HISTORY — DX: Malignant neoplasm of vulva, unspecified: C51.9

## 2016-12-19 LAB — URINALYSIS, ROUTINE W REFLEX MICROSCOPIC
Bilirubin Urine: NEGATIVE
Glucose, UA: NEGATIVE mg/dL
Hgb urine dipstick: NEGATIVE
KETONES UR: NEGATIVE mg/dL
LEUKOCYTES UA: NEGATIVE
NITRITE: NEGATIVE
PROTEIN: NEGATIVE mg/dL
Specific Gravity, Urine: 1.011 (ref 1.005–1.030)
pH: 8 (ref 5.0–8.0)

## 2016-12-19 LAB — CBC
HEMATOCRIT: 27.3 % — AB (ref 36.0–46.0)
HEMOGLOBIN: 9.6 g/dL — AB (ref 12.0–15.0)
MCH: 32 pg (ref 26.0–34.0)
MCHC: 35.2 g/dL (ref 30.0–36.0)
MCV: 91 fL (ref 78.0–100.0)
Platelets: 174 10*3/uL (ref 150–400)
RBC: 3 MIL/uL — AB (ref 3.87–5.11)
RDW: 15.5 % (ref 11.5–15.5)
WBC: 6.2 10*3/uL (ref 4.0–10.5)

## 2016-12-19 LAB — PROTEIN / CREATININE RATIO, URINE
Creatinine, Urine: 68 mg/dL
PROTEIN CREATININE RATIO: 0.15 mg/mg{creat} (ref 0.00–0.15)
Total Protein, Urine: 10 mg/dL

## 2016-12-19 LAB — COMPREHENSIVE METABOLIC PANEL
ALBUMIN: 3 g/dL — AB (ref 3.5–5.0)
ALK PHOS: 54 U/L (ref 38–126)
ALT: 8 U/L — ABNORMAL LOW (ref 14–54)
ANION GAP: 7 (ref 5–15)
AST: 14 U/L — AB (ref 15–41)
BILIRUBIN TOTAL: 0.6 mg/dL (ref 0.3–1.2)
BUN: 7 mg/dL (ref 6–20)
CALCIUM: 8 mg/dL — AB (ref 8.9–10.3)
CO2: 23 mmol/L (ref 22–32)
Chloride: 105 mmol/L (ref 101–111)
Creatinine, Ser: 0.44 mg/dL (ref 0.44–1.00)
GFR calc Af Amer: >60 mL/min (ref >60)
GFR calc non Af Amer: >60 mL/min (ref >60)
GLUCOSE: 72 mg/dL (ref 65–99)
Potassium: 3.1 mmol/L — ABNORMAL LOW (ref 3.5–5.1)
SODIUM: 135 mmol/L (ref 135–145)
TOTAL PROTEIN: 5.6 g/dL — AB (ref 6.5–8.1)

## 2016-12-19 LAB — TYPE AND SCREEN
ABO/RH(D): O POS
ANTIBODY SCREEN: NEGATIVE

## 2016-12-19 MED ORDER — DOCUSATE SODIUM 100 MG PO CAPS
100.0000 mg | ORAL_CAPSULE | Freq: Every day | ORAL | Status: DC
  Administered 2016-12-20 – 2016-12-21 (×2): 100 mg via ORAL
  Filled 2016-12-19 (×3): qty 1

## 2016-12-19 MED ORDER — CALCIUM CARBONATE ANTACID 500 MG PO CHEW: 3.0000 | CHEWABLE_TABLET | Freq: Every day | ORAL | Status: DC | PRN

## 2016-12-19 MED ORDER — PRENATAL MULTIVITAMIN CH
1.0000 | ORAL_TABLET | Freq: Every day | ORAL | Status: DC
  Administered 2016-12-20 – 2016-12-21 (×2): 1 via ORAL
  Filled 2016-12-19 (×3): qty 1

## 2016-12-19 MED ORDER — ACETAMINOPHEN 325 MG PO TABS: 650.0000 mg | ORAL_TABLET | ORAL | Status: DC | PRN

## 2016-12-19 MED ORDER — VITAFOL GUMMIES 3.33-0.333-34.8 MG PO CHEW: 3.0000 | CHEWABLE_TABLET | Freq: Every day | ORAL | Status: DC

## 2016-12-19 MED ORDER — ZOLPIDEM TARTRATE 5 MG PO TABS: 5.0000 mg | ORAL_TABLET | Freq: Every evening | ORAL | Status: DC | PRN

## 2016-12-19 MED ORDER — CALCIUM CARBONATE ANTACID 500 MG PO CHEW: 2.0000 | CHEWABLE_TABLET | ORAL | Status: DC | PRN

## 2016-12-19 MED ORDER — ENOXAPARIN SODIUM 40 MG/0.4ML ~~LOC~~ SOLN
40.0000 mg | SUBCUTANEOUS | Status: DC
  Administered 2016-12-19 – 2016-12-20 (×2): 40 mg via SUBCUTANEOUS
  Filled 2016-12-19 (×3): qty 0.4
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
## 2016-12-19 NOTE — MAU Note (Signed)
Pt. Transferred to [facility] on 3rd floor.  Report given to [facility], Therapist, sports.

## 2016-12-19 NOTE — MAU Note (Signed)
Pt C/O swelling in both legs from knees down since the beginning of this week, has become difficult to walk.  Has pelvic pain for the last 2 weeks.  Denies contractions, bleeding or LOF.

## 2016-12-19 NOTE — Progress Notes (Signed)
FACULTY PRACTICE ANTEPARTUM PROGRESS NOTE  Regina Villegas is a 31 y.o. [phone] at [redacted]w[redacted]d who is being observed overnight for concern for bilateral DVT in pregnancy. Pregnancy c/b placenta increta.   Length of Stay:  1 Days. Admitted 12/19/2016  Subjective: Patient reports normal fetal movement.  She reports no uterine contractions, no bleeding and no loss of fluid per vagina. Her pain in her legs is much improved and she reports it "doesn't feel like it is bursting" anymore. States it returns when she gets on her feet. Does think her pain is much better particularly after the lovenox shot.  Vitals:  Blood pressure 117/68, pulse 80, temperature 98.2 F (36.8 C), temperature source Oral, resp. rate 20, last menstrual period 04/17/2016, SpO2 100 %. Physical Examination: CONSTITUTIONAL: Well-developed, well-nourished female in no acute distress.  HENT:  Normocephalic, atraumatic, External right and left ear normal. Oropharynx is clear and moist EYES: Conjunctivae and EOM are normal. Pupils are equal, round, and reactive to light. No scleral icterus.  NECK: Normal range of motion, supple, no masses. SKIN: Skin is warm and dry. No rash noted. Not diaphoretic. No erythema. No pallor. [place]: Alert and oriented to person, place, and time. Normal reflexes, muscle tone coordination. No cranial nerve deficit noted. PSYCHIATRIC: Normal mood and affect. Normal behavior. Normal judgment and thought content. CARDIOVASCULAR: Normal heart rate noted, regular rhythm RESPIRATORY: Effort and breath sounds normal, no problems with respiration noted MUSCULOSKELETAL: Normal range of motion. Both lower extremities much improved, soft and only mildly tender to touch, warm with good pedal pulses, swelling almost completely gone ABDOMEN: Soft, nontender, nondistended, gravid.  Fetal monitoring: FHR: 125 bpm, Variability: moderate, Accelerations: Present, Decelerations: Absent  Uterine activity: none contractions per  hour    No results found.  Current scheduled medications . docusate sodium  100 mg Oral Daily  . enoxaparin (LOVENOX) injection  40 mg Subcutaneous Q24H  . prenatal multivitamin  1 tablet Oral Q1200    I have reviewed the patient's current medications.  ASSESSMENT: Active Problems:   Supervision of high risk pregnancy, antepartum   History of C-section   Placenta increta   Unwanted fertility   Calf pain   History of blood clots   PLAN: Duplex dopplers of B/L lower extremities today NST TID Activity as tolerated Regular diet   Continue routine antenatal care.   Feliz Beam, M.D. Attending [place], [facility] for [employer], [place]

## 2016-12-20 ENCOUNTER — Inpatient Hospital Stay (HOSPITAL_COMMUNITY): Payer: Medicaid Other

## 2016-12-20 DIAGNOSIS — Z3A29 29 weeks gestation of pregnancy: Secondary | ICD-10-CM

## 2016-12-20 DIAGNOSIS — R102 Pelvic and perineal pain: Secondary | ICD-10-CM

## 2016-12-20 DIAGNOSIS — O1203 Gestational edema, third trimester: Secondary | ICD-10-CM

## 2016-12-20 DIAGNOSIS — R609 Edema, unspecified: Secondary | ICD-10-CM

## 2016-12-20 DIAGNOSIS — O26893 Other specified pregnancy related conditions, third trimester: Secondary | ICD-10-CM

## 2016-12-20 NOTE — Progress Notes (Signed)
Called hospital operator and requested her to page on call ultrasound tech.

## 2016-12-20 NOTE — Progress Notes (Signed)
Called hospital operator and requested that she page ultrasound tech for ultrasound exam of lower extremities.

## 2016-12-20 NOTE — Progress Notes (Signed)
Bilateral lower extremity venous duplex has been completed. Negative for DVT.  12/20/16 8:55 AM Regina Villegas RVT

## 2016-12-21 DIAGNOSIS — O0993 Supervision of high risk pregnancy, unspecified, third trimester: Secondary | ICD-10-CM

## 2016-12-21 DIAGNOSIS — O24415 Gestational diabetes mellitus in pregnancy, controlled by oral hypoglycemic drugs: Secondary | ICD-10-CM

## 2016-12-21 DIAGNOSIS — O24419 Gestational diabetes mellitus in pregnancy, unspecified control: Secondary | ICD-10-CM | POA: Diagnosis present

## 2016-12-21 MED ORDER — ENOXAPARIN SODIUM 40 MG/0.4ML ~~LOC~~ SOLN: 40.0000 mg | SUBCUTANEOUS | 2 refills | Status: DC

## 2016-12-21 NOTE — Progress Notes (Signed)
Discharged home in stable condition, ambulatory.

## 2016-12-21 NOTE — Progress Notes (Signed)
Discharge instructions given to patient and she verbalized understanding of all instructions provided. Reviewed instructions on self-injection of Lovenox and patient returned demonstration. Written copy of AVS given to patient.

## 2016-12-21 NOTE — Progress Notes (Signed)
Secure message sent to on-call care manager via [place] regarding care management consult ordered for medication assistance.

## 2016-12-21 NOTE — Care Management Note (Signed)
Case Management Note  Patient Details  Name: Regina Villegas MRN: [id number] Date of Birth: [birth year]  Subjective/Objective: Received consult for CM assist. Pt has Medicaid which should cover Lovenox. Made RN aware and apologized for delay in return call, and delay in discharge.                   Action/Plan:CM will sign off for now but will be available should additional discharge needs arise or disposition change.    Expected Discharge Date:                  Expected Discharge Plan:     In-House Referral:     Discharge planning Services  CM Consult, Medication Assistance  Post Acute Care Choice:    Choice offered to:     DME Arranged:    DME Agency:     HH Arranged:    HH Agency:     Status of Service:  Completed, signed off  If discussed at [employer] of Stay Meetings, dates discussed:    Additional Comments:  Delrae Sawyers, RN 12/21/2016, 4:52 PM

## 2016-12-21 NOTE — Discharge Instructions (Signed)
Enoxaparin injection What is this medicine? ENOXAPARIN (ee nox a PA rin) is used after knee, hip, or abdominal surgeries to prevent blood clotting. It is also used to treat existing blood clots in the lungs or in the veins. This medicine may be used for other purposes; ask your health care provider or pharmacist if you have questions. COMMON BRAND NAME(S): Lovenox What should I tell my health care provider before I take this medicine? They need to know if you have any of these conditions: -bleeding disorders, hemorrhage, or hemophilia -infection of the heart or heart valves -kidney or liver disease -previous stroke -prosthetic heart valve -recent surgery or delivery of a baby -ulcer in the stomach or intestine, diverticulitis, or other bowel disease -an unusual or allergic reaction to enoxaparin, heparin, pork or pork products, other medicines, foods, dyes, or preservatives -pregnant or trying to get pregnant -breast-feeding How should I use this medicine? This medicine is for injection under the skin. It is usually given by a health-care professional. You or a family member may be trained on how to give the injections. If you are to give yourself injections, make sure you understand how to use the syringe, measure the dose if necessary, and give the injection. To avoid bruising, do not rub the site where this medicine has been injected. Do not take your medicine more often than directed. Do not stop taking except on the advice of your doctor or health care professional. Make sure you receive a puncture-resistant container to dispose of the needles and syringes once you have finished with them. Do not reuse these items. Return the container to your doctor or health care professional for proper disposal. Talk to your pediatrician regarding the use of this medicine in children. Special care may be needed. Overdosage: If you think you have taken too much of this medicine contact a poison control  center or emergency room at once. NOTE: This medicine is only for you. Do not share this medicine with others. What if I miss a dose? If you miss a dose, take it as soon as you can. If it is almost time for your next dose, take only that dose. Do not take double or extra doses. What may interact with this medicine? -aspirin and aspirin-like medicines -certain medicines that treat or prevent blood clots -dipyridamole -NSAIDs, medicines for pain and inflammation, like ibuprofen or naproxen This list may not describe all possible interactions. Give your health care provider a list of all the medicines, herbs, non-prescription drugs, or dietary supplements you use. Also tell them if you smoke, drink alcohol, or use illegal drugs. Some items may interact with your medicine. What should I watch for while using this medicine? Visit your doctor or health care professional for regular checks on your progress. Your condition will be monitored carefully while you are receiving this medicine. Notify your doctor or health care professional and seek emergency treatment if you develop breathing problems; changes in vision; chest pain; severe, sudden headache; pain, swelling, warmth in the leg; trouble speaking; sudden numbness or weakness of the face, arm, or leg. These can be signs that your condition has gotten worse. If you are going to have surgery, tell your doctor or health care professional that you are taking this medicine. Do not stop taking this medicine without first talking to your doctor. Be sure to refill your prescription before you run out of medicine. Avoid sports and activities that might cause injury while you are using this medicine. Severe   falls or injuries can cause unseen bleeding. Be careful when using sharp tools or knives. Consider using an electric razor. Take special care brushing or flossing your teeth. Report any injuries, bruising, or red spots on the skin to your doctor or health care  professional. What side effects may I notice from receiving this medicine? Side effects that you should report to your doctor or health care professional as soon as possible: -allergic reactions like skin rash, itching or hives, swelling of the face, lips, or tongue -feeling faint or lightheaded, falls -signs and symptoms of bleeding such as bloody or black, tarry stools; red or dark-brown urine; spitting up blood or brown material that looks like coffee grounds; red spots on the skin; unusual bruising or bleeding from the eye, gums, or nose Side effects that usually do not require medical attention (report to your doctor or health care professional if they continue or are bothersome): -pain, redness, or irritation at site where injected This list may not describe all possible side effects. Call your doctor for medical advice about side effects. You may report side effects to FDA at 1-800-FDA-1088. Where should I keep my medicine? Keep out of the reach of children. Store at room temperature between 15 and 30 degrees C (59 and 86 degrees F). Do not freeze. If your injections have been specially prepared, you may need to store them in the refrigerator. Ask your pharmacist. Throw away any unused medicine after the expiration date. NOTE: This sheet is a summary. It may not cover all possible information. If you have questions about this medicine, talk to your doctor, pharmacist, or health care provider.  2018 Elsevier/Gold Standard (2013-06-07 16:06:21)  

## 2016-12-21 NOTE — Discharge Summary (Signed)
Antenatal Physician Discharge Summary  Patient ID: Regina Villegas MRN: [id number] DOB/AGE: November 11, 1985 31 y.o.  Admit date: 12/19/2016 Discharge date: 12/21/2016  Admission Diagnoses: Principal Problem:   History of blood clots Active Problems:   Supervision of high risk pregnancy, antepartum   History of C-section   Placenta increta   Unwanted fertility   Calf pain   Gestational diabetes  Discharge Diagnoses: Same  Prenatal Procedures: LE Dopplers  Consults: None  Hospital Course:  This is a 31 y.o. [id number] with IUP at [redacted]w[redacted]d admitted for bilateral LE swelling and concern for DVT. She was admitted and treated for DVT. LE Dopplers were done, which revealed no evidence of DVT. She was noted to have h/o DVT in right leg following first pregnancy and needed anticoagulation. She was observed, fetal heart rate monitoring remained reassuring. She was deemed stable for discharge to home with outpatient follow up.  Discharge Exam: Temp:  [98.1 F (36.7 C)-98.7 F (37.1 C)] 98.7 F (37.1 C) (11/04 1635) Pulse Rate:  [85-93] 85 (11/04 1635) Resp:  [16-18] 18 (11/04 1635) BP: (116-134)/(69-74) 134/72 (11/04 1635) SpO2:  [98 %-100 %] 100 % (11/04 1635) Physical Examination: CONSTITUTIONAL: Well-developed, well-nourished female in no acute distress.  HENT:  Normocephalic, atraumatic. Oropharynx is clear and moist EYES: Conjunctivae and EOM are normal. No scleral icterus.  NECK: Normal range of motion, supple, no masses SKIN: Skin is warm and dry. No rash noted. Not diaphoretic. No erythema. No pallor. [place]: Alert and oriented to person, place, and time. Marland Kitchen PSYCHIATRIC: Normal mood and affect. Normal behavior. Normal judgment and thought content. CARDIOVASCULAR: Normal heart rate noted, regular rhythm RESPIRATORY: Effort and breath sounds normal MUSCULOSKELETAL: Normal range of motion.  ABDOMEN: Soft, nontender, nondistended, gravid.   Significant Diagnostic Studies:  Results  for orders placed or performed during the hospital encounter of 12/19/16 (from the past 168 hour(s))  Urinalysis, Routine w reflex microscopic   Collection Time: 12/19/16  3:12 PM  Result Value Ref Range   Color, Urine YELLOW YELLOW   APPearance HAZY (A) CLEAR   Specific Gravity, Urine 1.011 1.005 - 1.030   pH 8.0 5.0 - 8.0   Glucose, UA NEGATIVE NEGATIVE mg/dL   Hgb urine dipstick NEGATIVE NEGATIVE   Bilirubin Urine NEGATIVE NEGATIVE   Ketones, ur NEGATIVE NEGATIVE mg/dL   Protein, ur NEGATIVE NEGATIVE mg/dL   Nitrite NEGATIVE NEGATIVE   Leukocytes, UA NEGATIVE NEGATIVE  Protein / creatinine ratio, urine   Collection Time: 12/19/16  3:12 PM  Result Value Ref Range   Creatinine, Urine 68.00 mg/dL   Total Protein, Urine 10 mg/dL   Protein Creatinine Ratio 0.15 0.00 - 0.15 mg/mg[Cre]  CBC   Collection Time: 12/19/16  7:07 PM  Result Value Ref Range   WBC 6.2 4.0 - 10.5 K/uL   RBC 3.00 (L) 3.87 - 5.11 MIL/uL   Hemoglobin 9.6 (L) 12.0 - 15.0 g/dL   HCT 27.3 (L) 36.0 - 46.0 %   MCV 91.0 78.0 - 100.0 fL   MCH 32.0 26.0 - 34.0 pg   MCHC 35.2 30.0 - 36.0 g/dL   RDW 15.5 11.5 - 15.5 %   Platelets 174 150 - 400 K/uL  Comprehensive metabolic panel   Collection Time: 12/19/16  7:07 PM  Result Value Ref Range   Sodium 135 135 - 145 mmol/L   Potassium 3.1 (L) 3.5 - 5.1 mmol/L   Chloride 105 101 - 111 mmol/L   CO2 23 22 - 32 mmol/L   Glucose, Bld 72  65 - 99 mg/dL   BUN 7 6 - 20 mg/dL   Creatinine, Ser 0.44 0.44 - 1.00 mg/dL   Calcium 8.0 (L) 8.9 - 10.3 mg/dL   Total Protein 5.6 (L) 6.5 - 8.1 g/dL   Albumin 3.0 (L) 3.5 - 5.0 g/dL   AST 14 (L) 15 - 41 U/L   ALT 8 (L) 14 - 54 U/L   Alkaline Phosphatase 54 38 - 126 U/L   Total Bilirubin 0.6 0.3 - 1.2 mg/dL   GFR calc non Af Amer >60 >60 mL/min   GFR calc Af Amer >60 >60 mL/min   Anion gap 7 5 - 15  Type and screen [place]   Collection Time: 12/19/16  8:21 PM  Result Value Ref Range   ABO/RH(D) O POS     Antibody Screen NEG    Sample Expiration 12/22/2016     Discharge Condition: Stable  Disposition: 01-Home or Self Care   Discharge Instructions    Discharge activity:  No Restrictions   Complete by:  As directed    Discharge diet:   Complete by:  As directed    OB Diabetic diet   Fetal Kick Count:  Lie on our left side for one hour after a meal, and count the number of times your baby kicks.  If it is less than 5 times, get up, move around and drink some juice.  Repeat the test 30 minutes later.  If it is still less than 5 kicks in an hour, notify your doctor.   Complete by:  As directed    Notify physician for a general feeling that "something is not right"   Complete by:  As directed    Notify physician for increase or change in vaginal discharge   Complete by:  As directed    Notify physician for intestinal cramps, with or without diarrhea, sometimes described as "gas pain"   Complete by:  As directed    Notify physician for leaking of fluid   Complete by:  As directed    Notify physician for low, dull backache, unrelieved by heat or Tylenol   Complete by:  As directed    Notify physician for menstrual like cramps   Complete by:  As directed    Notify physician for pelvic pressure   Complete by:  As directed    Notify physician for uterine contractions.  These may be painless and feel like the uterus is tightening or the baby is  "balling up"   Complete by:  As directed    Notify physician for vaginal bleeding   Complete by:  As directed    PRETERM LABOR:  Includes any of the follwing symptoms that occur between 20 - [redacted] weeks gestation.  If these symptoms are not stopped, preterm labor can result in preterm delivery, placing your baby at risk   Complete by:  As directed      Allergies as of 12/21/2016      Reactions   Tramadol Itching      Medication List    TAKE these medications   calcium carbonate 500 MG chewable tablet Commonly known as:  TUMS - dosed in mg  elemental calcium Chew 3 tablets by mouth daily as needed for indigestion or heartburn.   enoxaparin 40 MG/0.4ML injection Commonly known as:  LOVENOX Inject 0.4 mLs (40 mg total) daily into the skin.   VITAFOL GUMMIES 3.33-0.333-34.8 MG Chew Chew 3 tablets by mouth daily.  Follow-up [place] Follow up.   Specialty:  Obstetrics and Gynecology Why:  keep next scheduled appoinment. Contact information: [address] [place] [phone]          Signed: Donnamae Jude M.D. 12/21/2016, 5:17 PM

## 2016-12-21 NOTE — Progress Notes (Signed)
Patient ID: Regina Villegas, female   DOB: 1986-01-26, 31 y.o.   MRN: [id number] [place] ANTEPARTUM COMPREHENSIVE PROGRESS NOTE  Ardyth Morson is a 31 y.o. G4P3003 at [redacted]w[redacted]d  who is admitted for bilateral leg pain and concern for DVT.   Fetal presentation is unsure. Length of Stay:  2  Days  Subjective: Pt reports no more leg pain. Has ambulated in the hallway without problems. + FM. Occ BH not painful. No LOF or VB.   Vitals:  Blood pressure 130/70, pulse 86, temperature 98.1 F (36.7 C), temperature source Oral, resp. rate 18, last menstrual period 04/17/2016, SpO2 100 %.   Physical Examination: Lungs clear Heart RRR Abd soft + BS gravid Ext non tender trace edema  Fetal Monitoring:  120-130's reactive for gestational age  Labs:  No results found for this or any previous visit (from the past 54 hour(s)).  Imaging Studies:    LE Doppler studies no evidence of DVT  Medications:  Scheduled . docusate sodium  100 mg Oral Daily  . enoxaparin (LOVENOX) injection  40 mg Subcutaneous Q24H  . prenatal multivitamin  1 tablet Oral Q1200   I have reviewed the patient's current medications.  ASSESSMENT: Patient Active Problem List   Diagnosis Date Noted  . Calf pain 12/19/2016  . History of blood clots 12/19/2016  . Unwanted fertility 12/01/2016  . Anxiety and depression 12/01/2016  . 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 Supervision of high risk pregnancy, antepartum 08/04/2016  . History of C-section 08/04/2016  . Genetic testing 06/26/2015  . Family history of breast cancer   . Orbital floor (blow-out) closed fracture ([place]) 04/02/2015  . 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 No sure of the etiology of pt's leg pain. H/O what sounds like a DVT in the past. Will continue with prophylactic Lovenox. Will have case manager see pt today for  arrangements to obtain Lovenox once discharged home. Hopefully will be able to do so tomorrow.     Chancy Milroy 12/21/2016,6:47 AM

## 2016-12-22 ENCOUNTER — Ambulatory Visit (INDEPENDENT_AMBULATORY_CARE_PROVIDER_SITE_OTHER): Payer: Medicaid Other | Admitting: Obstetrics and Gynecology

## 2016-12-22 ENCOUNTER — Encounter: Payer: Self-pay | Admitting: Obstetrics and Gynecology

## 2016-12-22 ENCOUNTER — Ambulatory Visit (INDEPENDENT_AMBULATORY_CARE_PROVIDER_SITE_OTHER): Payer: Medicaid Other | Admitting: Clinical

## 2016-12-22 VITALS — BP 114/69 | HR 109 | Wt 139.0 lb

## 2016-12-22 DIAGNOSIS — O0993 Supervision of high risk pregnancy, unspecified, third trimester: Secondary | ICD-10-CM

## 2016-12-22 DIAGNOSIS — Z86718 Personal history of other venous thrombosis and embolism: Secondary | ICD-10-CM

## 2016-12-22 DIAGNOSIS — Z98891 History of uterine scar from previous surgery: Secondary | ICD-10-CM

## 2016-12-22 DIAGNOSIS — F4323 Adjustment disorder with mixed anxiety and depressed mood: Secondary | ICD-10-CM

## 2016-12-22 DIAGNOSIS — O24415 Gestational diabetes mellitus in pregnancy, controlled by oral hypoglycemic drugs: Secondary | ICD-10-CM

## 2016-12-22 DIAGNOSIS — O0990 Supervision of high risk pregnancy, unspecified, unspecified trimester: Secondary | ICD-10-CM

## 2016-12-22 DIAGNOSIS — O43223 Placenta increta, third trimester: Secondary | ICD-10-CM

## 2016-12-22 NOTE — BH Specialist Note (Signed)
Integrated Behavioral Health Follow Up Visit  MRN: [id number] Name: Regina Villegas  Number of [place] Clinician visits: 2/6 Session Start time: 3:15  Session End time: 4:00 Total time: 45 minutes  Type of Service: [place] Interpretor:No. Interpretor Name and Language: n/a  SUBJECTIVE: Regina Villegas is a 31 y.o. female accompanied by n/a Patient was referred by f/u Dr Elly Modena for depression, anxiety. Patient reports the following symptoms/concerns: Pt states her primary concern today is a need to feel heard, and express her feelings, and hopes for the future. 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 Pt wants to start on antidepressant prior to leaving the hospital.  Duration of problem: Current pregnancy; Severity of problem: severe  OBJECTIVE: Mood: Depressed and Affect: Appropriate Risk of harm to self or others: No plan to harm self or others  LIFE CONTEXT: Family and Social: Lives with her 3 sons and fiancee; grandmother(greatest support after previous pregnancies) passed away 2 years prior; mother is somewhat supportive.  School/Work: Out of work for health reasons Self-Care: Family hx of alcohol abuse. Pt wants better for her family, and is taking steps to build a better life for herself and her children.  Life Changes: Current pregnancy (past 2 years: loss of grandparents, "face broken", divorce, cancer treatment, current placenta increta)  GOALS ADDRESSED: Patient will: 1.  Reduce symptoms of: agitation, anxiety, depression and stress  2.  Increase knowledge and/or ability of: coping skills and stress reduction  3.  Demonstrate ability to: Increase healthy adjustment to current life circumstances and Increase adequate support systems for patient/family   INTERVENTIONS: Interventions utilized:  Solution-Focused Strategies Standardized Assessments completed: GAD-7 and PHQ  9  ASSESSMENT: Patient currently experiencing Adjustment disorder with mixed anxious and depressed mood .   Patient may benefit from continued brief therapeutic interventions regarding coping with anxiety and depression, as well as referral to psychiatry for future [facility] med management and therapy.  PLAN: 1. Follow up with behavioral health clinician on : Two weeks 2. 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 Referral(s): [place] (In Clinic) 4. "From scale of 1-10, how likely are you to follow plan?": 9  Garlan Fair, LCSWA   Depression screen [facility] 2/9 12/19/2016 12/01/2016 07/20/2013  Decreased Interest 1 1 -  Down, Depressed, Hopeless 1 2 3   PHQ - 2 Score 2 3 3   Altered sleeping - 3 3  Tired, decreased energy - 1 1  Change in appetite - 1 3  Feeling bad or failure about yourself  - 3 3  Trouble concentrating - 1 3  Moving slowly or fidgety/restless - 2 3  Suicidal thoughts - 1 1  PHQ-9 Score - 15 20  Difficult doing work/chores - Somewhat difficult -   GAD 7 : Generalized Anxiety Score 12/01/2016 12/01/2016  Nervous, Anxious, on Edge - 3  Control/stop worrying 3 3  Worry too much - different things 3 3  Trouble relaxing 3 3  Restless 1 1  Easily annoyed or irritable 3 3  Afraid - awful might happen 3 3  Total GAD 7 Score - 19  Anxiety Difficulty Somewhat difficult Somewhat difficult

## 2016-12-22 NOTE — Progress Notes (Signed)
   PRENATAL VISIT NOTE  Subjective:  Regina Villegas is a 31 y.o. G4P3003 at 14w6dbeing seen today for ongoing prenatal care.  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  Patient reports onset of URI symptoms.  Contractions: Irregular. Vag. Bleeding: None.  Movement: Present. Denies leaking of fluid.   The following portions of the patient's history were reviewed and updated as appropriate: allergies, current medications, past family history, past medical history, past social history, past surgical history and problem list. Problem list updated.  Objective:   Vitals:   12/22/16 1306  BP: 114/69  Pulse: (!) 109  Weight: 139 lb (63 kg)    Fetal Status: Fetal Heart Rate (bpm): 147 Fundal Height: 30 cm Movement: Present     General:  Alert, oriented and cooperative. Patient is in no acute distress.  Skin: Skin is warm and dry. No rash noted.   Cardiovascular: Normal heart rate noted  Respiratory: Normal respiratory effort, no problems with respiration noted  Abdomen: Soft, gravid, appropriate for gestational age.  Pain/Pressure: Present     Pelvic: Cervical exam deferred        Extremities: Normal range of motion.  Edema: Trace  Mental Status:  Normal mood and affect. Normal behavior. Normal judgment and thought content.   Assessment and Plan:  Pregnancy: G4P3003 at 254w6d1. Gestational diabetes mellitus (GDM) in third trimester controlled on oral hypoglycemic drug Patient did not bring CBG log but reports fasting as high as 95  and postprandial as high as 110 Patient met with diabetic educator Continue diet control  2. [facility] Placenta increta in third trimester Follow up ultrasound scheduled 11/13 [facility] and [facility]yn Onc next week  4. Supervision of high risk pregnancy, antepartum Patient is doing well List of OTC meds reviewed Patient encouraged to increase fluid intake  5. History of blood clots Continue prophylactic lovenox  Preterm labor symptoms and general obstetric precautions including but not limited to vaginal bleeding, contractions, leaking of fluid and fetal movement were reviewed in detail with the patient. Please refer to After Visit Summary for other counseling recommendations.  Return in about 2 weeks (around 01/05/2017) for ROB.   PeMora BellmanMD

## 2016-12-30 ENCOUNTER — Ambulatory Visit (HOSPITAL_COMMUNITY): Payer: Medicaid Other

## 2017-01-05 ENCOUNTER — Ambulatory Visit: Payer: Self-pay

## 2017-01-05 ENCOUNTER — Encounter: Payer: Self-pay | Admitting: Obstetrics and Gynecology

## 2017-01-05 ENCOUNTER — Ambulatory Visit: Payer: Medicaid Other

## 2017-01-05 NOTE — BH Specialist Note (Deleted)
Integrated Behavioral Health Follow Up Visit  MRN: [id number] Name: Idelia Harari  Number of [place] Clinician visits: {IBH Number of Visits:21014052} Session Start time: ***  Session End time: *** Total time: {IBH Total Time:21014050}  Type of Service: [place] Interpretor:{yes [id number]} Interpretor Name and Language: ***  SUBJECTIVE: Kourtlyn Loftin is a 31 y.o. female accompanied by {Patient accompanied by:[phone]} Patient was referred by *** for ***. Patient reports the following symptoms/concerns: *** Duration of problem: ***; Severity of problem: {Mild/Moderate/Severe:20260}  OBJECTIVE: Mood: {BHH MOOD:22306} and Affect: {BHH AFFECT:22307} Risk of harm to self or others: {CHL AMB BH Suicide Current Mental Status:21022748}  LIFE CONTEXT: Family and Social: *** School/Work: *** Self-Care: *** Life Changes: ***  GOALS ADDRESSED: Patient will: 1.  Reduce symptoms of: {IBH Symptoms:21014056}  2.  Increase knowledge and/or ability of: {IBH Patient Tools:21014057}  3.  Demonstrate ability to: {IBH Goals:21014053}  INTERVENTIONS: Interventions utilized:  {IBH Interventions:21014054} Standardized Assessments completed: {IBH Screening Tools:21014051}  ASSESSMENT: Patient currently experiencing ***.   Patient may benefit from ***.  PLAN: 1. Follow up with behavioral health clinician on : *** 2. Behavioral recommendations: *** 3. Referral(s): {IBH Referrals:21014055} 4. "From scale of 1-10, how likely are you to follow plan?": ***  Bahja Bence C Brier Firebaugh, LCSWA

## 2017-01-06 ENCOUNTER — Ambulatory Visit: Payer: Medicaid Other

## 2017-01-06 NOTE — BH Specialist Note (Deleted)
Integrated Behavioral Health Follow Up Visit  MRN: [id number] Name: Regina Villegas  Number of [place] Clinician visits: 3/6 Session Start time: ***  Session End time: *** Total time: {IBH Total Time:21014050}  Type of Service: [place] Interpretor:No. Interpretor Name and Language: n/a  SUBJECTIVE: Regina Villegas is a 31 y.o. female accompanied by {Patient accompanied by:[phone]} Patient was referred by *** for ***. Patient reports the following symptoms/concerns: *** Duration of problem: ***; Severity of problem: {Mild/Moderate/Severe:20260}  OBJECTIVE: Mood: {BHH MOOD:22306} and Affect: {BHH AFFECT:22307} Risk of harm to self or others: {CHL AMB BH Suicide Current Mental Status:21022748}  LIFE CONTEXT: Family and Social: *** School/Work: *** Self-Care: *** Life Changes: ***  GOALS ADDRESSED: Patient will: 1.  Reduce symptoms of: {IBH Symptoms:21014056}  2.  Increase knowledge and/or ability of: {IBH Patient Tools:21014057}  3.  Demonstrate ability to: {IBH Goals:21014053}  INTERVENTIONS: Interventions utilized:  {IBH Interventions:21014054} Standardized Assessments completed: {IBH Screening Tools:21014051}  ASSESSMENT: Patient currently experiencing ***.   Patient may benefit from ***.  PLAN: 1. Follow up with behavioral health clinician on : *** 2. Behavioral recommendations: *** 3. Referral(s): {IBH Referrals:21014055} 4. "From scale of 1-10, how likely are you to follow plan?": ***  Jamie C McMannes, LCSWA

## 2017-01-20 ENCOUNTER — Ambulatory Visit (HOSPITAL_COMMUNITY)
Admission: RE | Admit: 2017-01-20 | Discharge: 2017-01-20 | Disposition: A | Discharge status: Home / Self Care | Payer: Medicaid Other | Source: Ambulatory Visit | Attending: Certified Nurse Midwife | Admitting: Certified Nurse Midwife

## 2017-01-20 DIAGNOSIS — Z3A34 34 weeks gestation of pregnancy: Secondary | ICD-10-CM | POA: Diagnosis not present

## 2017-01-20 DIAGNOSIS — O43213 Placenta accreta, third trimester: Secondary | ICD-10-CM | POA: Diagnosis not present

## 2017-01-20 MED ORDER — BETAMETHASONE SOD PHOS & ACET 6 (3-3) MG/ML IJ SUSP
12.0000 mg | Freq: Once | INTRAMUSCULAR | Status: AC
  Administered 2017-01-20: 12 mg via INTRAMUSCULAR
  Filled 2017-01-20: qty 2

## 2017-01-21 ENCOUNTER — Ambulatory Visit (HOSPITAL_COMMUNITY): Payer: Medicaid Other

## 2017-04-17 ENCOUNTER — Emergency Department (HOSPITAL_COMMUNITY)
Admission: EM | Admit: 2017-04-17 | Discharge: 2017-04-17 | Disposition: A | Discharge status: Home / Self Care | Payer: Medicaid Other | Attending: Emergency Medicine | Admitting: Emergency Medicine

## 2017-04-17 ENCOUNTER — Other Ambulatory Visit: Payer: Self-pay

## 2017-04-17 ENCOUNTER — Encounter (HOSPITAL_COMMUNITY): Payer: Self-pay

## 2017-04-17 DIAGNOSIS — Z8632 Personal history of gestational diabetes: Secondary | ICD-10-CM | POA: Diagnosis not present

## 2017-04-17 DIAGNOSIS — J029 Acute pharyngitis, unspecified: Secondary | ICD-10-CM | POA: Diagnosis present

## 2017-04-17 DIAGNOSIS — Z8544 Personal history of malignant neoplasm of other female genital organs: Secondary | ICD-10-CM | POA: Diagnosis not present

## 2017-04-17 DIAGNOSIS — Z87891 Personal history of nicotine dependence: Secondary | ICD-10-CM | POA: Insufficient documentation

## 2017-04-17 DIAGNOSIS — Z7901 Long term (current) use of anticoagulants: Secondary | ICD-10-CM | POA: Insufficient documentation

## 2017-04-17 LAB — RAPID STREP SCREEN (MED CTR MEBANE ONLY): Streptococcus, Group A Screen (Direct): NEGATIVE

## 2017-04-17 MED ORDER — LIDOCAINE VISCOUS 2 % MT SOLN: 15.0000 mL | OROMUCOSAL | 0 refills | Status: DC | PRN

## 2017-04-17 NOTE — ED Notes (Signed)
sore throat  X several days but worse yesterday ,  Has not taken any tylenol or motrin ,  No one else sick at home

## 2017-04-17 NOTE — Discharge Instructions (Signed)
Please read attached information regarding your condition. Swish and spit lidocaine solution to help with throat discomfort.  Do not swallow. Follow-up with the ear nose and throat specialist listed below for further evaluation. Return to ED for worsening symptoms, trouble breathing or trouble swallowing, drooling, trouble opening mouth, chest pain or neck pain.
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
## 2017-04-17 NOTE — ED Triage Notes (Signed)
Patient complains of 3 days of sore throat and feeling as if tonsils swelling, NAD

## 2017-04-19 LAB — CULTURE, GROUP A STREP (THRC)

## 2017-05-22 ENCOUNTER — Other Ambulatory Visit (HOSPITAL_COMMUNITY)
Admission: RE | Admit: 2017-05-22 | Discharge: 2017-05-22 | Disposition: A | Discharge status: Home / Self Care | Payer: Medicaid Other | Source: Ambulatory Visit | Attending: Certified Nurse Midwife | Admitting: Certified Nurse Midwife

## 2017-05-22 ENCOUNTER — Ambulatory Visit (INDEPENDENT_AMBULATORY_CARE_PROVIDER_SITE_OTHER): Payer: Medicaid Other | Admitting: Certified Nurse Midwife

## 2017-05-22 ENCOUNTER — Encounter: Payer: Self-pay | Admitting: Certified Nurse Midwife

## 2017-05-22 VITALS — BP 145/85 | HR 90 | Ht 63.0 in | Wt 120.2 lb

## 2017-05-22 DIAGNOSIS — Z113 Encounter for screening for infections with a predominantly sexual mode of transmission: Secondary | ICD-10-CM

## 2017-05-22 DIAGNOSIS — B373 Candidiasis of vulva and vagina: Secondary | ICD-10-CM | POA: Diagnosis not present

## 2017-05-22 DIAGNOSIS — I10 Essential (primary) hypertension: Secondary | ICD-10-CM | POA: Diagnosis not present

## 2017-05-22 DIAGNOSIS — B3731 Acute candidiasis of vulva and vagina: Secondary | ICD-10-CM

## 2017-05-22 DIAGNOSIS — N898 Other specified noninflammatory disorders of vagina: Secondary | ICD-10-CM | POA: Diagnosis present

## 2017-05-22 MED ORDER — TERCONAZOLE 0.8 % VA CREA: 1.0000 | TOPICAL_CREAM | Freq: Every day | VAGINAL | 0 refills | Status: DC

## 2017-05-22 MED ORDER — FLUCONAZOLE 200 MG PO TABS: 200.0000 mg | ORAL_TABLET | Freq: Once | ORAL | 0 refills | Status: AC

## 2017-05-22 MED ORDER — HYDROCHLOROTHIAZIDE 12.5 MG PO CAPS: 12.5000 mg | ORAL_CAPSULE | Freq: Every day | ORAL | 5 refills | Status: DC

## 2017-05-22 NOTE — Progress Notes (Signed)
Pt c/o outer vulva itching, denies discharge and odor. Pt wants all STD testing today.

## 2017-05-23 LAB — RPR: RPR: NONREACTIVE

## 2017-05-23 LAB — HEPATITIS B SURFACE ANTIGEN: Hepatitis B Surface Ag: NEGATIVE

## 2017-05-23 LAB — HIV ANTIBODY (ROUTINE TESTING W REFLEX): HIV SCREEN 4TH GENERATION: NONREACTIVE

## 2017-05-23 LAB — HEPATITIS C ANTIBODY

## 2017-05-25 ENCOUNTER — Telehealth: Payer: Self-pay

## 2017-05-25 LAB — CERVICOVAGINAL ANCILLARY ONLY
Bacterial vaginitis: POSITIVE — AB
CHLAMYDIA, DNA PROBE: NEGATIVE
Candida vaginitis: POSITIVE — AB
NEISSERIA GONORRHEA: NEGATIVE
Trichomonas: POSITIVE — AB

## 2017-05-25 NOTE — Telephone Encounter (Signed)
Pt left VM for return call ASAP.  Called pt back and she said she used to be a pt of Dr Denman George and needs to be seen for vulvar pain, was seen at [facility] and her provider Linwood Dibbles said to f/u with Dr Denman George.  Appt made for this Thurs at 1:30 pm, Dr Skeet Latch- pt agreeable with seeing different provider than  Dr Denman George as she would like soonest appt.
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
## 2017-05-26 ENCOUNTER — Other Ambulatory Visit: Payer: Self-pay | Admitting: Certified Nurse Midwife

## 2017-05-26 DIAGNOSIS — B373 Candidiasis of vulva and vagina: Secondary | ICD-10-CM

## 2017-05-26 DIAGNOSIS — B3731 Acute candidiasis of vulva and vagina: Secondary | ICD-10-CM

## 2017-05-26 DIAGNOSIS — A599 Trichomoniasis, unspecified: Secondary | ICD-10-CM | POA: Insufficient documentation

## 2017-05-26 DIAGNOSIS — B9689 Other specified bacterial agents as the cause of diseases classified elsewhere: Secondary | ICD-10-CM

## 2017-05-26 DIAGNOSIS — N760 Acute vaginitis: Principal | ICD-10-CM

## 2017-05-26 MED ORDER — TERCONAZOLE 0.8 % VA CREA
1.0000 | TOPICAL_CREAM | Freq: Every day | VAGINAL | 0 refills | Status: DC
Start: 2017-05-26 — End: 2017-05-28

## 2017-05-26 MED ORDER — FLUCONAZOLE 200 MG PO TABS: 200.0000 mg | ORAL_TABLET | Freq: Once | ORAL | 0 refills | Status: AC

## 2017-05-26 MED ORDER — SECNIDAZOLE 2 G PO PACK: 1.0000 | PACK | Freq: Once | ORAL | 0 refills | Status: AC

## 2017-05-26 MED ORDER — METRONIDAZOLE 500 MG PO TABS: 2000.0000 mg | ORAL_TABLET | Freq: Once | ORAL | 0 refills | Status: AC

## 2017-05-27 ENCOUNTER — Telehealth: Payer: Self-pay

## 2017-05-27 NOTE — Telephone Encounter (Signed)
-----   Message from Morene Crocker, CNM sent at 05/26/2017 11:18 AM EDT ----- Please call patient and notify of +BV & Trich results. Metrondiazole 2000 mg PO X1 days. #4 no refills.  Please tell her not to drink alcohol with this medication as it will cause her to vomit.  Please also let her know that she has a yeast infection.  Diflucan and Terconazole vaginal cream was sent to the pharmacy for her to use.  Please call patient and notify of +BV results. Solosec sent X1 packet, has tried Flagyl and tindamax in the past.         Please schedule her a TOC in 4-6 weeks.  Please tell her to have her partner treated as well.    Thank you, Kandis Cocking CNM

## 2017-05-27 NOTE — Telephone Encounter (Signed)
Patient notified of results and RX; verbalized understanding.

## 2017-05-28 ENCOUNTER — Inpatient Hospital Stay: Payer: Medicaid Other | Attending: Gynecologic Oncology | Admitting: Gynecologic Oncology

## 2017-05-28 ENCOUNTER — Encounter: Payer: Self-pay | Admitting: Gynecologic Oncology

## 2017-05-28 VITALS — BP 117/74 | HR 80 | Temp 98.4°F | Resp 20 | Wt 116.0 lb

## 2017-05-28 DIAGNOSIS — Z8544 Personal history of malignant neoplasm of other female genital organs: Secondary | ICD-10-CM | POA: Diagnosis present

## 2017-05-28 DIAGNOSIS — D071 Carcinoma in situ of vulva: Secondary | ICD-10-CM | POA: Diagnosis not present

## 2017-05-28 DIAGNOSIS — R102 Pelvic and perineal pain: Secondary | ICD-10-CM
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
## 2017-05-28 NOTE — Patient Instructions (Addendum)
We will call you with the results of the biopsies done today. Follow up with Dr. Skeet Latch in 6 months. Call [phone] in July to schedule an appointment for October with Dr. Skeet Latch.

## 2017-06-03 ENCOUNTER — Telehealth: Payer: Self-pay

## 2017-06-03 NOTE — Telephone Encounter (Signed)
Told [name] that the biopsies showed no dysplasia or malignancy per Melissa Cross,NP.Keep follow up with Dr. Skeet Latch as instructed in AVS from 4-11  appointment.for October. Pt verbalized understanding.  Pt states that the biopsy sites are still sore L>R. Afebrile Pt using ibuprofen.  Reviewed with Joylene John, NP. Told [name] that Melissa recomends Icing site prn and Using a spritz bottle after urination and pat area dry.  Wear loose clothing when possible and leave area open to air at night.

## 2017-09-07 ENCOUNTER — Ambulatory Visit: Payer: Medicaid Other | Admitting: Family Medicine

## 2018-04-14 ENCOUNTER — Encounter (HOSPITAL_COMMUNITY): Payer: Self-pay

## 2018-04-14 ENCOUNTER — Ambulatory Visit (HOSPITAL_COMMUNITY)
Admission: EM | Admit: 2018-04-14 | Discharge: 2018-04-14 | Disposition: A | Discharge status: Home / Self Care | Payer: Self-pay | Attending: Family Medicine | Admitting: Family Medicine

## 2018-04-14 DIAGNOSIS — R112 Nausea with vomiting, unspecified: Secondary | ICD-10-CM

## 2018-04-14 LAB — POCT RAPID STREP A: Streptococcus, Group A Screen (Direct): NEGATIVE

## 2018-04-14 MED ORDER — ONDANSETRON 4 MG PO TBDP: 4.0000 mg | ORAL_TABLET | Freq: Three times a day (TID) | ORAL | 0 refills | Status: DC | PRN

## 2018-04-14 NOTE — ED Triage Notes (Signed)
Pt presents with general abdominal pain, nausea, vomiting , and chills since yesterday.
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
## 2018-04-16 LAB — CULTURE, GROUP A STREP (THRC)

## 2018-08-30 ENCOUNTER — Telehealth: Payer: Self-pay | Admitting: *Deleted

## 2018-08-30 NOTE — Telephone Encounter (Signed)
Patient called and stated "I haven't been in a while to follow up due to the virus going on. But I'm having issues. I'm now having pain and I have visual lesions. I can hardly stand to wear underwear. I don't want to see the last doctor I saw, I want to go back to Dr. Terrence Dupont." Explained that I would talk with Melissa APP and that we would call her back.

## 2018-08-31 ENCOUNTER — Telehealth: Payer: Self-pay | Admitting: *Deleted

## 2018-08-31 NOTE — Telephone Encounter (Signed)
Called and scheduled the patient for a an appt on 7/24

## 2018-09-10 ENCOUNTER — Other Ambulatory Visit: Payer: Self-pay

## 2018-09-10 ENCOUNTER — Other Ambulatory Visit (HOSPITAL_COMMUNITY)
Admission: RE | Admit: 2018-09-10 | Discharge: 2018-09-10 | Disposition: A | Discharge status: Home / Self Care | Payer: Medicaid Other | Source: Ambulatory Visit | Attending: Gynecologic Oncology | Admitting: Gynecologic Oncology

## 2018-09-10 ENCOUNTER — Encounter: Payer: Self-pay | Admitting: *Deleted

## 2018-09-10 ENCOUNTER — Encounter: Payer: Self-pay | Admitting: Gynecologic Oncology

## 2018-09-10 ENCOUNTER — Inpatient Hospital Stay: Payer: Self-pay | Attending: Gynecologic Oncology | Admitting: Gynecologic Oncology

## 2018-09-10 VITALS — BP 115/83 | HR 80 | Temp 97.8°F | Resp 19 | Ht 63.0 in | Wt 119.7 lb

## 2018-09-10 DIAGNOSIS — Z90710 Acquired absence of both cervix and uterus: Secondary | ICD-10-CM | POA: Insufficient documentation

## 2018-09-10 DIAGNOSIS — R102 Pelvic and perineal pain: Secondary | ICD-10-CM | POA: Insufficient documentation

## 2018-09-10 DIAGNOSIS — D071 Carcinoma in situ of vulva: Secondary | ICD-10-CM | POA: Insufficient documentation

## 2018-09-10 DIAGNOSIS — L280 Lichen simplex chronicus: Secondary | ICD-10-CM | POA: Insufficient documentation

## 2018-09-10 DIAGNOSIS — A630 Anogenital (venereal) warts: Secondary | ICD-10-CM | POA: Insufficient documentation

## 2018-09-10 NOTE — Patient Instructions (Addendum)
Dr Denman George performed biopsies of the vulva. No actual precancerous or cancerous areas were seen. She will call you with the results next week when they are available. If they show vulvar dystrophy, she will prescribe steroid ointment.   Use the perineal squeeze bottle to rinse the vulva while you pass urine.   If no precancer is found on biopsies, Dr Denman George recommends that you follow up with your regular OBGYN provider annually for wellness checks.
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
## 2018-09-15 LAB — CYTOLOGY - PAP
Diagnosis: NEGATIVE
HPV: NOT DETECTED

## 2018-09-22 ENCOUNTER — Telehealth: Payer: Self-pay

## 2018-09-22 DIAGNOSIS — L280 Lichen simplex chronicus: Secondary | ICD-10-CM

## 2018-09-22 NOTE — Telephone Encounter (Signed)
Tried to reach patient to give her results of tests. Could no leave a voicemail as her mail box was full.

## 2018-09-23 MED ORDER — CLOBETASOL PROPIONATE 0.05 % EX OINT: 1.0000 "application " | TOPICAL_OINTMENT | Freq: Two times a day (BID) | CUTANEOUS | 1 refills | Status: DC

## 2018-09-23 NOTE — Telephone Encounter (Signed)
Told Regina Villegas that the pap mear was negative and no high risk HPV detected. Told her that the biopsies showed Lichen simplex chronicus. She did well after biopsies.  No drainage note from biopsy sites. No vaginal drainage or foul odor noted.This is not a cancer or pre-cancer.  Dr. Denman George does not treat this. A regular gyn follows this.  No follow up needed with Dr. Denman George. Follow up with your regular gyn. Continue to use the clobetasol to these areas.  Regina Villegas states that she cannot locate the clobetasol ointment. Requesting a prescription to be called in to [facility].

## 2018-12-22 ENCOUNTER — Encounter (HOSPITAL_COMMUNITY): Payer: Self-pay | Admitting: Emergency Medicine

## 2018-12-22 ENCOUNTER — Other Ambulatory Visit: Payer: Self-pay

## 2018-12-22 ENCOUNTER — Emergency Department (HOSPITAL_COMMUNITY)
Admission: EM | Admit: 2018-12-22 | Discharge: 2018-12-22 | Disposition: A | Discharge status: Home / Self Care | Payer: 59 | Attending: Emergency Medicine | Admitting: Emergency Medicine

## 2018-12-22 DIAGNOSIS — Z8544 Personal history of malignant neoplasm of other female genital organs: Secondary | ICD-10-CM | POA: Insufficient documentation

## 2018-12-22 DIAGNOSIS — Z87891 Personal history of nicotine dependence: Secondary | ICD-10-CM | POA: Diagnosis not present

## 2018-12-22 DIAGNOSIS — R079 Chest pain, unspecified: Secondary | ICD-10-CM | POA: Diagnosis present

## 2018-12-22 DIAGNOSIS — I471 Supraventricular tachycardia: Secondary | ICD-10-CM | POA: Diagnosis not present

## 2018-12-22 LAB — CBC WITH DIFFERENTIAL/PLATELET
Abs Immature Granulocytes: 0.01 10*3/uL (ref 0.00–0.07)
Basophils Absolute: 0 10*3/uL (ref 0.0–0.1)
Basophils Relative: 0 %
Eosinophils Absolute: 0 10*3/uL (ref 0.0–0.5)
Eosinophils Relative: 0 %
HCT: 47.8 % — ABNORMAL HIGH (ref 36.0–46.0)
Hemoglobin: 16.7 g/dL — ABNORMAL HIGH (ref 12.0–15.0)
Immature Granulocytes: 0 %
Lymphocytes Relative: 20 %
Lymphs Abs: 1 10*3/uL (ref 0.7–4.0)
MCH: 33.2 pg (ref 26.0–34.0)
MCHC: 34.9 g/dL (ref 30.0–36.0)
MCV: 95 fL (ref 80.0–100.0)
Monocytes Absolute: 0.2 10*3/uL (ref 0.1–1.0)
Monocytes Relative: 4 %
Neutro Abs: 3.8 10*3/uL (ref 1.7–7.7)
Neutrophils Relative %: 76 %
Platelets: 311 10*3/uL (ref 150–400)
RBC: 5.03 MIL/uL (ref 3.87–5.11)
RDW: 13.5 % (ref 11.5–15.5)
WBC: 5.1 10*3/uL (ref 4.0–10.5)
nRBC: 0 % (ref 0.0–0.2)

## 2018-12-22 LAB — I-STAT BETA HCG BLOOD, ED (MC, WL, AP ONLY): I-stat hCG, quantitative: <5 m[IU]/mL (ref <5)

## 2018-12-22 LAB — BASIC METABOLIC PANEL
Anion gap: 15 (ref 5–15)
BUN: 11 mg/dL (ref 6–20)
CO2: 23 mmol/L (ref 22–32)
Calcium: 9.6 mg/dL (ref 8.9–10.3)
Chloride: 102 mmol/L (ref 98–111)
Creatinine, Ser: 0.93 mg/dL (ref 0.44–1.00)
GFR calc Af Amer: >60 mL/min (ref >60)
GFR calc non Af Amer: >60 mL/min (ref >60)
Glucose, Bld: 94 mg/dL (ref 70–99)
Potassium: 5 mmol/L (ref 3.5–5.1)
Sodium: 140 mmol/L (ref 135–145)
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
## 2019-01-24 ENCOUNTER — Other Ambulatory Visit: Payer: Self-pay | Admitting: Cardiology

## 2019-01-24 DIAGNOSIS — Z20822 Contact with and (suspected) exposure to covid-19: Secondary | ICD-10-CM

## 2019-01-25 LAB — NOVEL CORONAVIRUS, NAA: SARS-CoV-2, NAA: NOT DETECTED

## 2019-02-26 ENCOUNTER — Telehealth: Payer: 59 | Admitting: Nurse Practitioner

## 2019-02-26 DIAGNOSIS — R059 Cough, unspecified: Secondary | ICD-10-CM

## 2019-02-26 DIAGNOSIS — R05 Cough: Secondary | ICD-10-CM

## 2019-02-26 MED ORDER — BENZONATATE 100 MG PO CAPS: 100.0000 mg | ORAL_CAPSULE | Freq: Three times a day (TID) | ORAL | 0 refills | Status: DC | PRN

## 2019-02-26 NOTE — Progress Notes (Signed)
We are sorry that you are not feeling well.  Here is how we plan to help!  Based on your presentation I believe you most likely have A cough due to a virus.  This is called viral bronchitis and is best treated by rest, plenty of fluids and control of the cough.  You may use Ibuprofen or Tylenol as directed to help your symptoms.     In addition you may use A prescription cough medication called Tessalon Perles 100mg . You may take 1-2 capsules every 8 hours as needed for your cough.  If you decide you need to be covid tested, here is the information you need:  Testing Information: The COVID-19 Community Testing sites will begin testing BY APPOINTMENT ONLY.  You can schedule online at [email or web address]  If you do not have access to a smart phone or computer you may call [phone] for an appointment.  Testing Locations: Appointment schedule is 8 am to 3:30 pm at all sites  [facility] indoors at [address], [place] [address] [facility]  indoors at [place]. [address] [place], [place] [address] [place] indoors at [address], [place] [address]  Additional testing sites in the Community:  . For [facility] Testing sites in [facility]  FaceUpdate.uy  . For Pop-up testing sites in [place]  BowlDirectory.co.uk  . For Testing sites with regular hours [email or web address].[email or web address]  . For [place] [email or web address]  . For [facility] BasicJet.[email or web address]  . For [facility] testing in [place] and [employer] [email or web address]  . For [facility] testing in [facility]    [email or web address].[email or web address]  For  more information about community testing call [phone]   From your responses in the eVisit questionnaire you describe inflammation in the upper respiratory tract which is causing a significant cough.  This is commonly called Bronchitis and has four common causes:    Allergies  Viral Infections  Acid Reflux  Bacterial Infection Allergies, viruses and acid reflux are treated by controlling symptoms or eliminating the cause. An example might be a cough caused by taking certain blood pressure medications. You stop the cough by changing the medication. Another example might be a cough caused by acid reflux. Controlling the reflux helps control the cough.  USE OF BRONCHODILATOR ("RESCUE") INHALERS: There is a risk from using your bronchodilator too frequently.  The risk is that over-reliance on a medication which only relaxes the muscles surrounding the breathing tubes can reduce the effectiveness of medications prescribed to reduce swelling and congestion of the tubes themselves.  Although you feel brief relief from the bronchodilator inhaler, your asthma may actually be worsening with the tubes becoming more swollen and filled with mucus.  This can delay other crucial treatments, such as oral steroid medications. If you need to use a bronchodilator inhaler daily, several times per day, you should discuss this with your provider.  There are probably better treatments that could be used to keep your asthma under control.     HOME CARE . Only take medications as instructed by your medical team. . Complete the entire course of an antibiotic. . Drink plenty of fluids and get plenty of rest. . Avoid close contacts especially the very young and the elderly . Cover your mouth if you cough or cough into your sleeve. . Always remember to wash your hands . A steam or ultrasonic humidifier can help congestion.   GET HELP RIGHT AWAY IF: . You develop worsening  fever. . You become short of breath . You cough up blood. . Your symptoms persist after you have completed your treatment plan MAKE SURE YOU   Understand these instructions.  Will watch your condition.  Will get help right away if you are not doing well or get worse.  Your e-visit answers were reviewed by a board certified advanced clinical practitioner to complete your personal care plan.  Depending on the condition, your plan could have included both over the counter or prescription medications. If there is a problem please reply  once you have received a response from your provider. Your safety is important to [place].  If you have drug allergies check your prescription carefully.    You can use MyChart to ask questions about today's visit, request a non-urgent call back, or ask for a work or school excuse for 24 hours related to this e-Visit. If it has been greater than 24 hours you will need to follow up with your provider, or enter a new e-Visit to address those concerns. You will get an e-mail in the next two days asking about your experience.  I hope that your e-visit has been valuable and will speed your recovery. Thank you for using e-visits.   5-10 minutes spent reviewing and documenting in chart.

## 2020-04-16 ENCOUNTER — Telehealth: Payer: Self-pay

## 2020-04-16 ENCOUNTER — Telehealth: Payer: Self-pay | Admitting: *Deleted

## 2020-04-16 NOTE — Telephone Encounter (Signed)
Returned patient call and informed her she will need to follow up with gyn or pcp. Patient verbalized understanding.

## 2020-04-16 NOTE — Telephone Encounter (Signed)
Returned patient call.  No answer, voicemail is full.  Patient was released back to benign gyn 08/2018 and will need to follow up with gyn for referral if needed.

## 2020-04-16 NOTE — Telephone Encounter (Signed)
Patient called and stated "I am having issues again. I have a place that is hurting and getting larger. It as been worse over the last 2 days. I'm having issues with pain and walking." Explained that I would have a nurse call her back.

## 2020-04-18 ENCOUNTER — Encounter (HOSPITAL_COMMUNITY): Payer: Self-pay | Admitting: Emergency Medicine

## 2020-04-18 ENCOUNTER — Emergency Department (HOSPITAL_COMMUNITY)
Admission: EM | Admit: 2020-04-18 | Discharge: 2020-04-19 | Disposition: A | Discharge status: Home / Self Care | Payer: 59 | Attending: Emergency Medicine | Admitting: Emergency Medicine

## 2020-04-18 ENCOUNTER — Emergency Department (HOSPITAL_COMMUNITY): Payer: 59

## 2020-04-18 DIAGNOSIS — Z87891 Personal history of nicotine dependence: Secondary | ICD-10-CM | POA: Diagnosis not present

## 2020-04-18 DIAGNOSIS — L02215 Cutaneous abscess of perineum: Secondary | ICD-10-CM | POA: Diagnosis not present

## 2020-04-18 DIAGNOSIS — L0291 Cutaneous abscess, unspecified: Secondary | ICD-10-CM

## 2020-04-18 DIAGNOSIS — L02214 Cutaneous abscess of groin: Secondary | ICD-10-CM | POA: Diagnosis present

## 2020-04-18 DIAGNOSIS — Z79899 Other long term (current) drug therapy: Secondary | ICD-10-CM | POA: Insufficient documentation

## 2020-04-18 DIAGNOSIS — Z8544 Personal history of malignant neoplasm of other female genital organs: Secondary | ICD-10-CM | POA: Diagnosis not present

## 2020-04-18 LAB — CBC WITH DIFFERENTIAL/PLATELET
Abs Immature Granulocytes: 0.03 10*3/uL (ref 0.00–0.07)
Basophils Absolute: 0 10*3/uL (ref 0.0–0.1)
Basophils Relative: 0 %
Eosinophils Absolute: 0 10*3/uL (ref 0.0–0.5)
Eosinophils Relative: 0 %
HCT: 40.4 % (ref 36.0–46.0)
Hemoglobin: 13.7 g/dL (ref 12.0–15.0)
Immature Granulocytes: 0 %
Lymphocytes Relative: 18 %
Lymphs Abs: 1.7 10*3/uL (ref 0.7–4.0)
MCH: 32.4 pg (ref 26.0–34.0)
MCHC: 33.9 g/dL (ref 30.0–36.0)
MCV: 95.5 fL (ref 80.0–100.0)
Monocytes Absolute: 0.8 10*3/uL (ref 0.1–1.0)
Monocytes Relative: 8 %
Neutro Abs: 7.2 10*3/uL (ref 1.7–7.7)
Neutrophils Relative %: 74 %
Platelets: 253 10*3/uL (ref 150–400)
RBC: 4.23 MIL/uL (ref 3.87–5.11)
RDW: 12.9 % (ref 11.5–15.5)
WBC: 9.8 10*3/uL (ref 4.0–10.5)
nRBC: 0 % (ref 0.0–0.2)

## 2020-04-18 LAB — BASIC METABOLIC PANEL
Anion gap: 9 (ref 5–15)
BUN: 11 mg/dL (ref 6–20)
CO2: 26 mmol/L (ref 22–32)
Calcium: 9 mg/dL (ref 8.9–10.3)
Chloride: 97 mmol/L — ABNORMAL LOW (ref 98–111)
Creatinine, Ser: 0.71 mg/dL (ref 0.44–1.00)
GFR, Estimated: >60 mL/min (ref >60)
Glucose, Bld: 101 mg/dL — ABNORMAL HIGH (ref 70–99)
Potassium: 3 mmol/L — ABNORMAL LOW (ref 3.5–5.1)
Sodium: 132 mmol/L — ABNORMAL LOW (ref 135–145)

## 2020-04-18 IMAGING — CT CT PELVIS W/ CM
2 of 3 series · 16 of 46 positions shown, 18 images · IV contrast (APPLIED)
Comparison: [DATE]

CLINICAL DATA: Left inguinal abscess, pain

EXAM:
CT PELVIS WITH CONTRAST
TECHNIQUE: Multidetector CT imaging of the pelvis was performed using the
standard protocol following the bolus administration of intravenous
contrast.
CONTRAST:  100mL OMNIPAQUE IOHEXOL 300 MG/ML  SOLN

[Series 5: soft tissue · axial · 0.79mm/px · z∈[+923,+1165]mm · 13 of 141 slices shown, 15 images]
[im 10/141  soft-tissue]
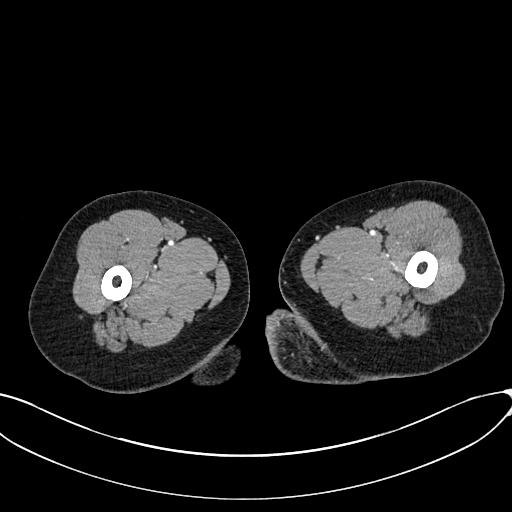
[im 10/141  bone]
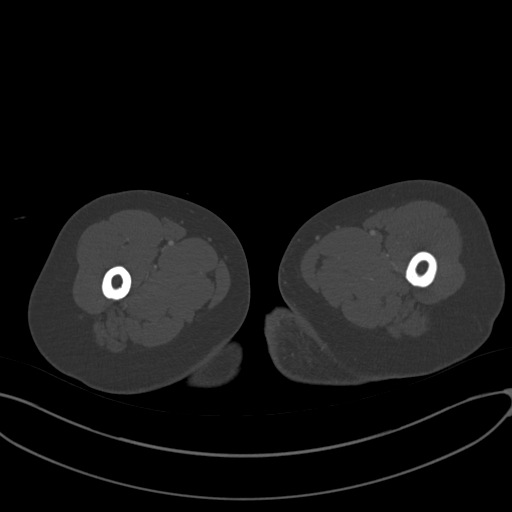
[im 19/141  soft-tissue]
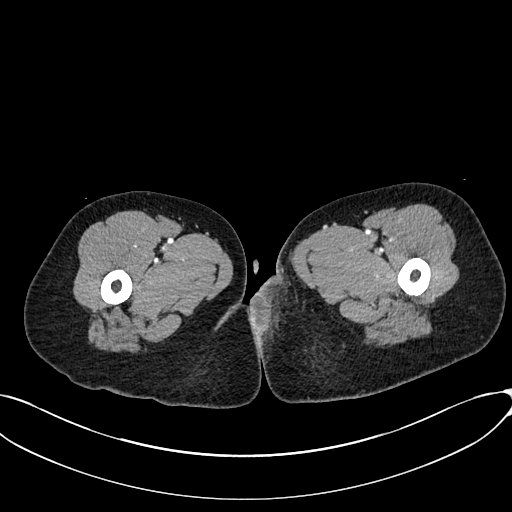
[im 28/141  soft-tissue]
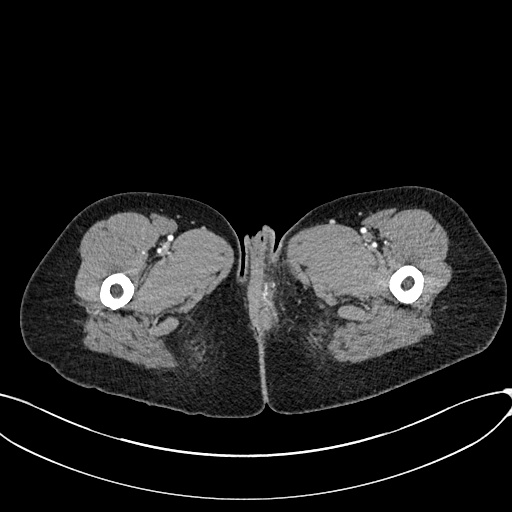
[im 41/141  soft-tissue]
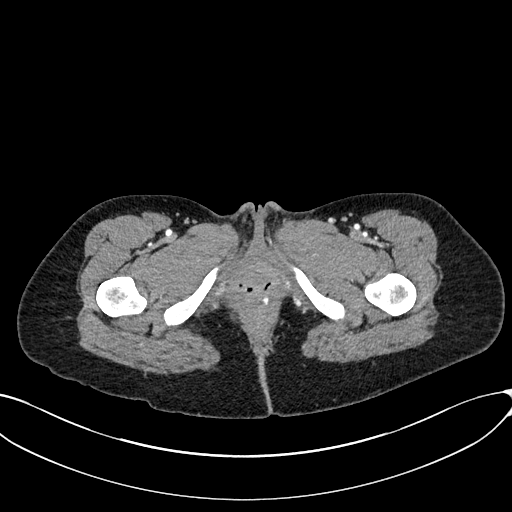
[im 50/141  soft-tissue]
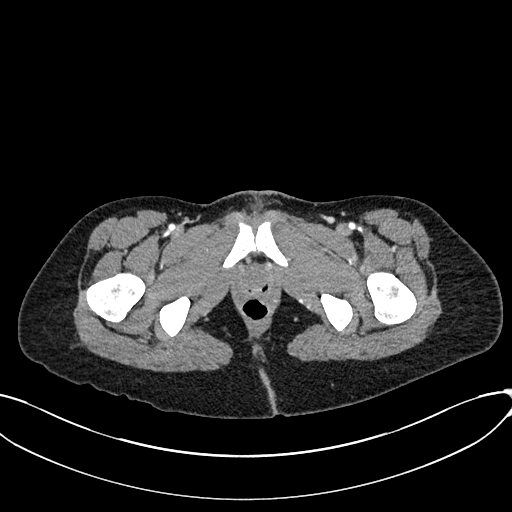
[im 59/141  soft-tissue]
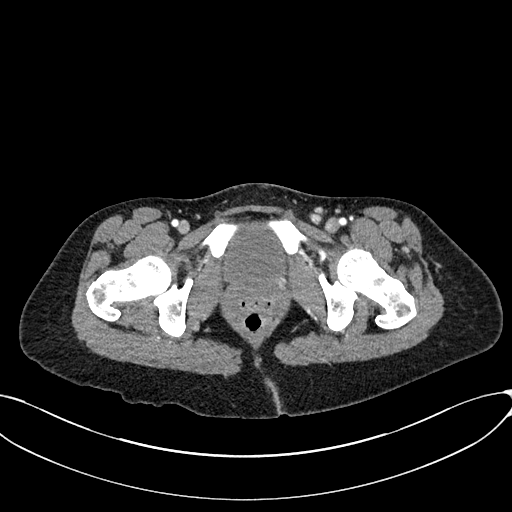
[im 73/141  soft-tissue]
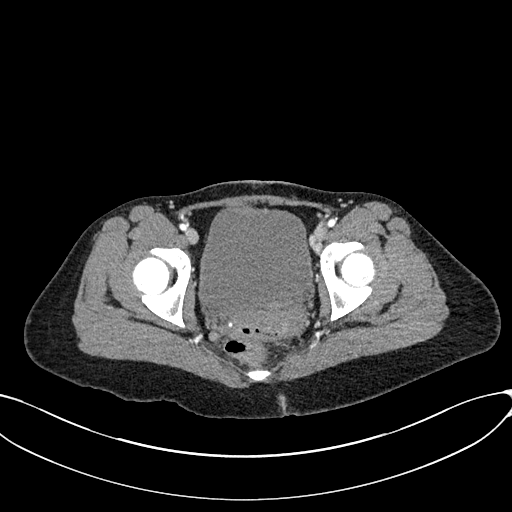
[im 82/141  soft-tissue]
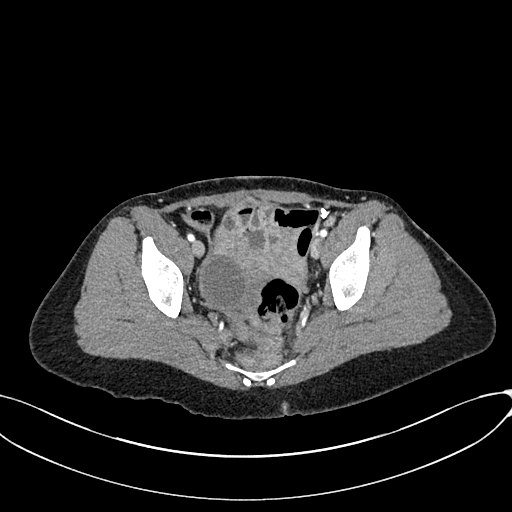
[im 91/141  soft-tissue]
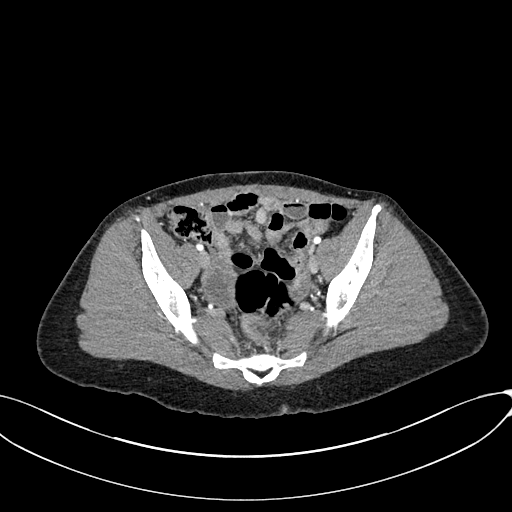
[im 91/141  bone]
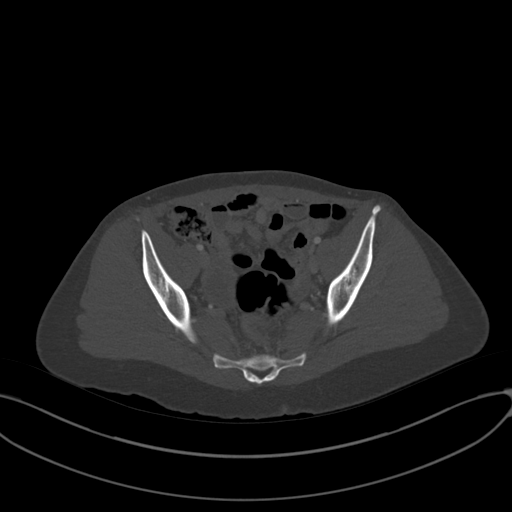
[im 100/141  soft-tissue]
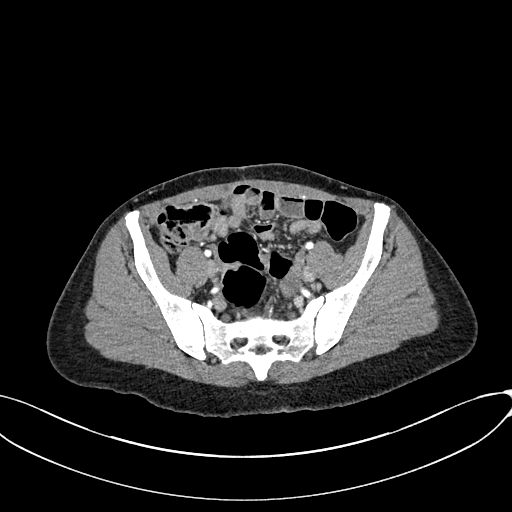
[im 113/141  soft-tissue]
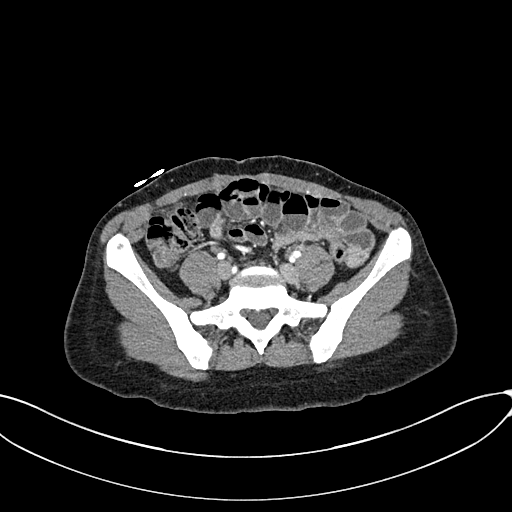
[im 122/141  soft-tissue]
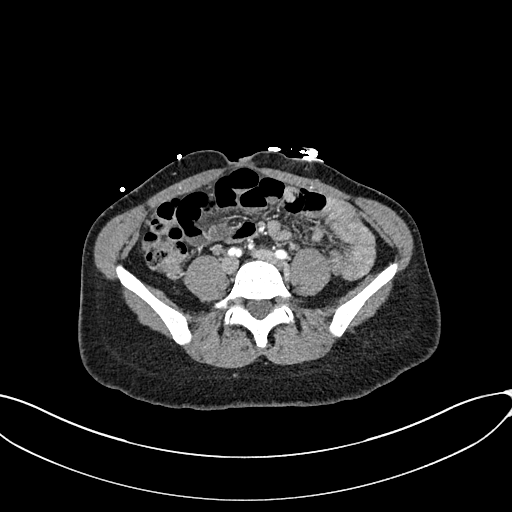
[im 131/141  soft-tissue]
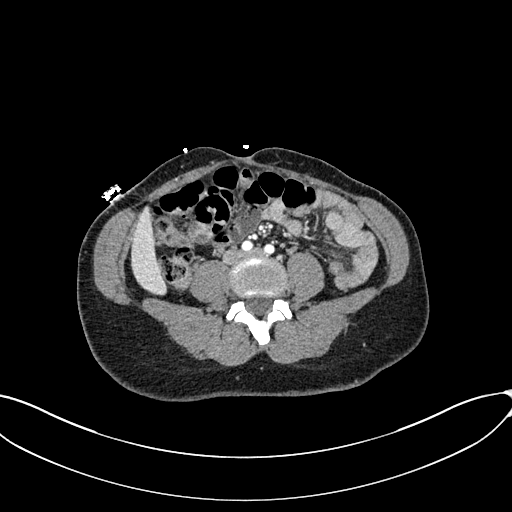

[Series 6: cor soft · coronal · 0.58mm/px · 3 of 110 slices shown]
[im 37/110  soft-tissue]
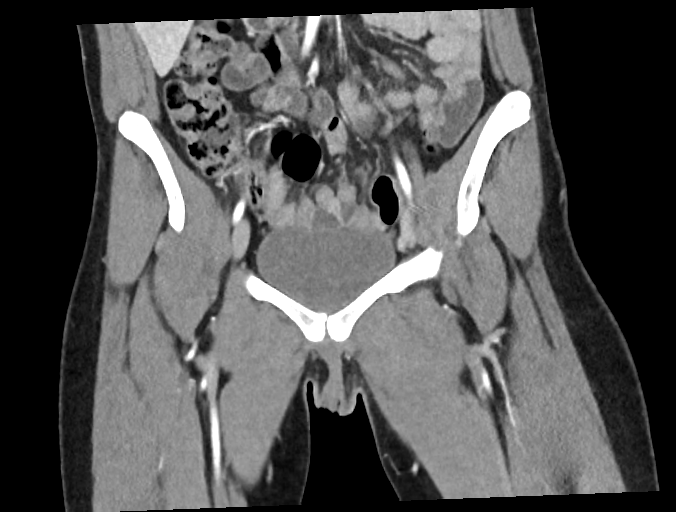
[im 49/110  soft-tissue]
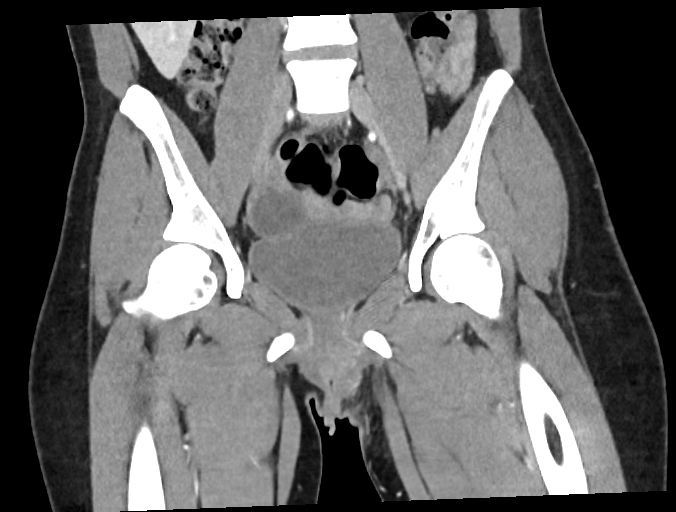
[im 61/110  soft-tissue]
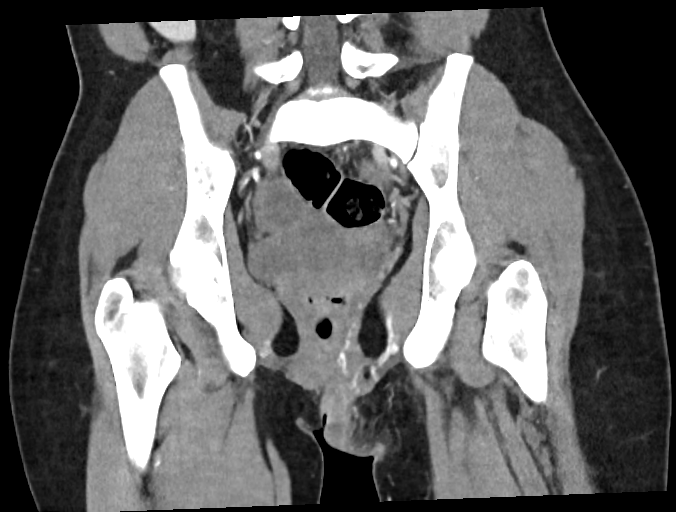

[16 of 46 positions shown; findings below may reference images not displayed]

FINDINGS: Urinary Tract: The distal ureters and bladder are unremarkable. No
urinary tract calculi.

Bowel: No bowel obstruction or ileus. No wall thickening or
inflammatory change. Normal appendix right lower quadrant.

Vascular/Lymphatic: No significant vascular findings. No pathologic
adenopathy.

Reproductive: The uterus is surgically absent. Simple appearing
x 3.7 by 3.5 cm right adnexal cyst. Left ovary is unremarkable.

Other: No free intraperitoneal fluid or free gas. No abdominal wall
hernia.

There is a left perineal abscess measuring approximately 2.7 by
by 2.7 cm, located immediately deep to the skin surface. Mild
surrounding subcutaneous fat stranding within the left buttock
consistent with cellulitis.

Musculoskeletal: No acute or destructive bony lesions. Reconstructed
images demonstrate no additional findings.
IMPRESSION: 1. 2.7 cm left perineal abscess.
2. 4.2 cm simple right adnexal cyst. No follow-up imaging
recommended. Note: This recommendation does not apply to
premenarchal patients and to those with increased risk (genetic,
family history, elevated tumor markers or other high-risk factors)
of ovarian cancer. Reference: JACR [DATE]):[id number]

## 2020-04-18 MED ORDER — POTASSIUM CHLORIDE CRYS ER 20 MEQ PO TBCR
40.0000 meq | EXTENDED_RELEASE_TABLET | Freq: Once | ORAL | Status: AC
  Administered 2020-04-19: 40 meq via ORAL
  Filled 2020-04-18: qty 2

## 2020-04-18 MED ORDER — IOHEXOL 300 MG/ML  SOLN
100.0000 mL | Freq: Once | INTRAMUSCULAR | Status: AC | PRN
  Administered 2020-04-18: 100 mL via INTRAVENOUS

## 2020-04-18 MED ORDER — SODIUM CHLORIDE 0.9 % IV BOLUS
1000.0000 mL | Freq: Once | INTRAVENOUS | Status: AC
  Administered 2020-04-18: 1000 mL via INTRAVENOUS

## 2020-04-18 MED ORDER — LIDOCAINE-EPINEPHRINE 1 %-1:100000 IJ SOLN
10.0000 mL | Freq: Once | INTRAMUSCULAR | Status: AC
  Administered 2020-04-19: 10 mL
  Filled 2020-04-18: qty 1

## 2020-04-18 MED ORDER — IBUPROFEN 400 MG PO TABS
600.0000 mg | ORAL_TABLET | Freq: Once | ORAL | Status: AC
  Administered 2020-04-19: 600 mg via ORAL
  Filled 2020-04-18: qty 1

## 2020-04-18 MED ORDER — MORPHINE SULFATE (PF) 4 MG/ML IV SOLN
4.0000 mg | Freq: Once | INTRAVENOUS | Status: AC
  Administered 2020-04-18: 4 mg via INTRAVENOUS
  Filled 2020-04-18: qty 1

## 2020-04-18 MED ORDER — ONDANSETRON HCL 4 MG/2ML IJ SOLN
4.0000 mg | Freq: Once | INTRAMUSCULAR | Status: AC
  Administered 2020-04-18: 4 mg via INTRAVENOUS
  Filled 2020-04-18: qty 2

## 2020-04-18 MED ORDER — DOXYCYCLINE HYCLATE 100 MG PO TABS
100.0000 mg | ORAL_TABLET | Freq: Once | ORAL | Status: AC
  Administered 2020-04-19: 100 mg via ORAL
  Filled 2020-04-18: qty 1

## 2020-04-18 NOTE — ED Triage Notes (Signed)
Patient complains of an an abscess in left groin near vulva. States it started as a pea sized hard bump that could move around under the skin, starting Friday it became larger and severely painful. No drainage. Patient is alert, oriented, and in no apparent distress at this time.

## 2020-04-18 NOTE — Discharge Instructions (Addendum)
You were seen in the emergency department for a skin abscess- please see the attached handout for further information regarding this diagnoses. This area was incised and drained to help release the bacteria. We would like you to apply warm compresses and warm flushes to this area 4-5 times per day to help facilitate further draining as needed. We are also starting you on doxycycline, an antibiotic, in order to help treat the infection.   Use ibuprofen and tylenol for pain.   Your potassium was a bit low today, please increase potassium in your diet with foods like bananas, mustard, pickles.  Have this rechecked with your PCP.  We have prescribed you new medication(s) today. Discuss the medications prescribed today with your pharmacist as they can have adverse effects and interactions with your other medicines including over the counter and prescribed medications. Seek medical evaluation if you start to experience new or abnormal symptoms after taking one of these medicines, seek care immediately if you start to experience difficulty breathing, feeling of your throat closing, facial swelling, or rash as these could be indications of a more serious allergic reaction  We would like you to have this area rechecked within 48 hours if it is not improving- please return to the ER , go to an urgent care, or see your primary care provider for this. Return to the ER sooner for new or worsening symptoms including, but not limited to increased pain, spreading redness, fevers, inability to keep fluids down, or any other concerns that you may have.
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
## 2020-04-19 MED ORDER — DOXYCYCLINE HYCLATE 100 MG PO CAPS: 100.0000 mg | ORAL_CAPSULE | Freq: Two times a day (BID) | ORAL | 0 refills | Status: DC

## 2020-08-22 ENCOUNTER — Ambulatory Visit: Payer: 59 | Admitting: Obstetrics and Gynecology

## 2020-09-03 ENCOUNTER — Encounter: Payer: Self-pay | Admitting: Obstetrics and Gynecology

## 2020-09-03 ENCOUNTER — Other Ambulatory Visit (HOSPITAL_COMMUNITY)
Admission: RE | Admit: 2020-09-03 | Discharge: 2020-09-03 | Disposition: A | Discharge status: Home / Self Care | Payer: Medicaid Other | Source: Ambulatory Visit | Attending: Obstetrics and Gynecology | Admitting: Obstetrics and Gynecology

## 2020-09-03 ENCOUNTER — Ambulatory Visit (INDEPENDENT_AMBULATORY_CARE_PROVIDER_SITE_OTHER): Payer: Self-pay | Admitting: Obstetrics and Gynecology

## 2020-09-03 ENCOUNTER — Other Ambulatory Visit: Payer: Self-pay

## 2020-09-03 VITALS — BP 115/73 | HR 77 | Ht 63.0 in | Wt 126.0 lb

## 2020-09-03 DIAGNOSIS — Z113 Encounter for screening for infections with a predominantly sexual mode of transmission: Secondary | ICD-10-CM | POA: Diagnosis present

## 2020-09-03 DIAGNOSIS — E349 Endocrine disorder, unspecified: Secondary | ICD-10-CM

## 2020-09-03 DIAGNOSIS — Z01419 Encounter for gynecological examination (general) (routine) without abnormal findings: Secondary | ICD-10-CM

## 2020-09-03 NOTE — Progress Notes (Signed)
Pt states that she has been feeling like hormones have changed since her hysterectomy- pt states symptoms have worsened over time.
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
## 2020-09-04 LAB — CBC WITH DIFFERENTIAL/PLATELET
Basophils Absolute: 0 10*3/uL (ref 0.0–0.2)
Basos: 0 %
EOS (ABSOLUTE): 0 10*3/uL (ref 0.0–0.4)
Eos: 1 %
Hematocrit: 40.7 % (ref 34.0–46.6)
Hemoglobin: 13.8 g/dL (ref 11.1–15.9)
Immature Grans (Abs): 0 10*3/uL (ref 0.0–0.1)
Immature Granulocytes: 0 %
Lymphocytes Absolute: 1.8 10*3/uL (ref 0.7–3.1)
Lymphs: 34 %
MCH: 32.8 pg (ref 26.6–33.0)
MCHC: 33.9 g/dL (ref 31.5–35.7)
MCV: 97 fL (ref 79–97)
Monocytes Absolute: 0.4 10*3/uL (ref 0.1–0.9)
Monocytes: 7 %
Neutrophils Absolute: 3.2 10*3/uL (ref 1.4–7.0)
Neutrophils: 58 %
Platelets: 222 10*3/uL (ref 150–450)
RBC: 4.21 x10E6/uL (ref 3.77–5.28)
RDW: 13.4 % (ref 11.7–15.4)
WBC: 5.4 10*3/uL (ref 3.4–10.8)

## 2020-09-04 LAB — TSH: TSH: 0.651 u[IU]/mL (ref 0.450–4.500)

## 2020-09-04 LAB — COMPREHENSIVE METABOLIC PANEL
ALT: 10 IU/L (ref 0–32)
AST: 10 IU/L (ref 0–40)
Albumin/Globulin Ratio: 2 (ref 1.2–2.2)
Albumin: 4.4 g/dL (ref 3.8–4.8)
Alkaline Phosphatase: 47 IU/L (ref 44–121)
BUN/Creatinine Ratio: 21 (ref 9–23)
BUN: 17 mg/dL (ref 6–20)
Bilirubin Total: 0.2 mg/dL (ref 0.0–1.2)
CO2: 23 mmol/L (ref 20–29)
Calcium: 8.8 mg/dL (ref 8.7–10.2)
Chloride: 101 mmol/L (ref 96–106)
Creatinine, Ser: 0.8 mg/dL (ref 0.57–1.00)
Globulin, Total: 2.2 g/dL (ref 1.5–4.5)
Glucose: 83 mg/dL (ref 65–99)
Potassium: 4.1 mmol/L (ref 3.5–5.2)
Sodium: 136 mmol/L (ref 134–144)
Total Protein: 6.6 g/dL (ref 6.0–8.5)
eGFR: 98 mL/min/{1.73_m2} (ref >59)

## 2020-09-04 LAB — RPR+HBSAG+HCVAB+...
HIV Screen 4th Generation wRfx: NONREACTIVE
Hep C Virus Ab: 0.2 s/co ratio (ref 0.0–0.9)
Hepatitis B Surface Ag: NEGATIVE
RPR Ser Ql: NONREACTIVE

## 2020-09-05 LAB — CERVICOVAGINAL ANCILLARY ONLY
Bacterial Vaginitis (gardnerella): POSITIVE — AB
Candida Glabrata: NEGATIVE
Candida Vaginitis: NEGATIVE
Chlamydia: NEGATIVE
Comment: NEGATIVE
Comment: NEGATIVE
Comment: NEGATIVE
Comment: NEGATIVE
Comment: NEGATIVE
Comment: NORMAL
Neisseria Gonorrhea: NEGATIVE
Trichomonas: POSITIVE — AB

## 2020-09-06 MED ORDER — METRONIDAZOLE 500 MG PO TABS: 500.0000 mg | ORAL_TABLET | Freq: Two times a day (BID) | ORAL | 0 refills | Status: DC

## 2020-09-06 NOTE — Addendum Note (Signed)
Addended by: Mora Bellman on: 09/06/2020 03:34 PM   Modules accepted: Orders

## 2020-12-04 ENCOUNTER — Emergency Department (HOSPITAL_BASED_OUTPATIENT_CLINIC_OR_DEPARTMENT_OTHER): Payer: Medicaid Other | Admitting: Radiology

## 2020-12-04 ENCOUNTER — Encounter (HOSPITAL_BASED_OUTPATIENT_CLINIC_OR_DEPARTMENT_OTHER): Payer: Self-pay | Admitting: *Deleted

## 2020-12-04 ENCOUNTER — Other Ambulatory Visit: Payer: Self-pay

## 2020-12-04 ENCOUNTER — Emergency Department (HOSPITAL_BASED_OUTPATIENT_CLINIC_OR_DEPARTMENT_OTHER)
Admission: EM | Admit: 2020-12-04 | Discharge: 2020-12-04 | Disposition: A | Discharge status: Home / Self Care | Payer: Medicaid Other | Attending: Emergency Medicine | Admitting: Emergency Medicine

## 2020-12-04 ENCOUNTER — Emergency Department (HOSPITAL_COMMUNITY): Payer: Medicaid Other

## 2020-12-04 ENCOUNTER — Emergency Department (HOSPITAL_BASED_OUTPATIENT_CLINIC_OR_DEPARTMENT_OTHER): Payer: Medicaid Other

## 2020-12-04 DIAGNOSIS — R202 Paresthesia of skin: Secondary | ICD-10-CM | POA: Insufficient documentation

## 2020-12-04 DIAGNOSIS — Z8544 Personal history of malignant neoplasm of other female genital organs: Secondary | ICD-10-CM | POA: Insufficient documentation

## 2020-12-04 DIAGNOSIS — F17210 Nicotine dependence, cigarettes, uncomplicated: Secondary | ICD-10-CM | POA: Insufficient documentation

## 2020-12-04 DIAGNOSIS — R200 Anesthesia of skin: Secondary | ICD-10-CM

## 2020-12-04 DIAGNOSIS — R531 Weakness: Secondary | ICD-10-CM | POA: Insufficient documentation

## 2020-12-04 DIAGNOSIS — M545 Low back pain, unspecified: Secondary | ICD-10-CM | POA: Insufficient documentation

## 2020-12-04 LAB — CBC WITH DIFFERENTIAL/PLATELET
Abs Immature Granulocytes: 0.01 10*3/uL (ref 0.00–0.07)
Basophils Absolute: 0 10*3/uL (ref 0.0–0.1)
Basophils Relative: 0 %
Eosinophils Absolute: 0 10*3/uL (ref 0.0–0.5)
Eosinophils Relative: 0 %
HCT: 42.8 % (ref 36.0–46.0)
Hemoglobin: 15 g/dL (ref 12.0–15.0)
Immature Granulocytes: 0 %
Lymphocytes Relative: 36 %
Lymphs Abs: 1.9 10*3/uL (ref 0.7–4.0)
MCH: 32.4 pg (ref 26.0–34.0)
MCHC: 35 g/dL (ref 30.0–36.0)
MCV: 92.4 fL (ref 80.0–100.0)
Monocytes Absolute: 0.4 10*3/uL (ref 0.1–1.0)
Monocytes Relative: 8 %
Neutro Abs: 2.9 10*3/uL (ref 1.7–7.7)
Neutrophils Relative %: 56 %
Platelets: 252 10*3/uL (ref 150–400)
RBC: 4.63 MIL/uL (ref 3.87–5.11)
RDW: 13.6 % (ref 11.5–15.5)
WBC: 5.2 10*3/uL (ref 4.0–10.5)
nRBC: 0 % (ref 0.0–0.2)

## 2020-12-04 LAB — COMPREHENSIVE METABOLIC PANEL
ALT: 9 U/L (ref 0–44)
AST: 12 U/L — ABNORMAL LOW (ref 15–41)
Albumin: 4.7 g/dL (ref 3.5–5.0)
Alkaline Phosphatase: 41 U/L (ref 38–126)
Anion gap: 12 (ref 5–15)
BUN: 13 mg/dL (ref 6–20)
CO2: 23 mmol/L (ref 22–32)
Calcium: 9.3 mg/dL (ref 8.9–10.3)
Chloride: 102 mmol/L (ref 98–111)
Creatinine, Ser: 0.58 mg/dL (ref 0.44–1.00)
GFR, Estimated: >60 mL/min (ref >60)
Glucose, Bld: 92 mg/dL (ref 70–99)
Potassium: 3.9 mmol/L (ref 3.5–5.1)
Sodium: 137 mmol/L (ref 135–145)
Total Bilirubin: 0.6 mg/dL (ref 0.3–1.2)
Total Protein: 7.6 g/dL (ref 6.5–8.1)

## 2020-12-04 LAB — CK: Total CK: 49 U/L (ref 38–234)

## 2020-12-04 LAB — MAGNESIUM: Magnesium: 2.1 mg/dL (ref 1.7–2.4)

## 2020-12-04 IMAGING — MR MR CERVICAL SPINE WO/W CM
6 of 8 series · 29 of 48 positions shown · IV contrast (gadavist)
Comparison: None.

CLINICAL DATA: Left-sided numbness or tingling, paresthesias,
history cancer.

EXAM:
MRI CERVICAL, THORACIC AND LUMBAR SPINE WITHOUT AND WITH CONTRAST
TECHNIQUE: Multiplanar and multiecho pulse sequences of the cervical spine, to
include the craniocervical junction and cervicothoracic junction,
and thoracic and lumbar spine, were obtained without and with
intravenous contrast.
CONTRAST:  6mL GADAVIST GADOBUTROL 1 MMOL/ML IV SOLN

[Series 5: T2 · sagittal · 3.0mm · 0.69mm/px · 3 of 15 slices shown (1 of 2)]
[im 1/15]
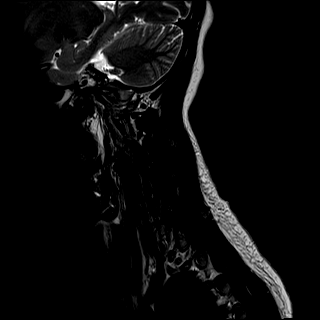
[im 8/15]
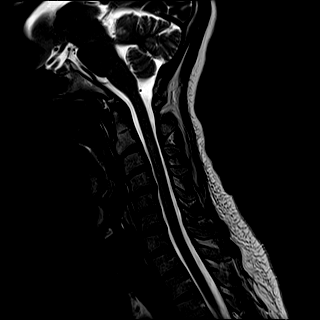
[im 15/15]
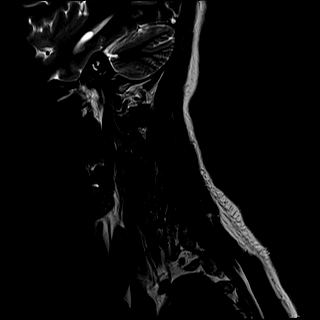

[Series 6: T1 · sagittal · 3.0mm · 0.69mm/px · 3 of 15 slices shown (1 of 2)]
[im 1/15]
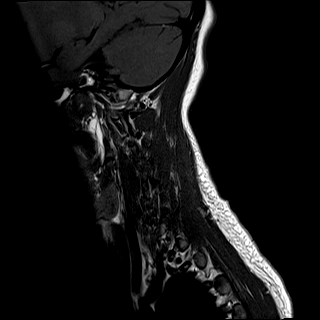
[im 8/15]
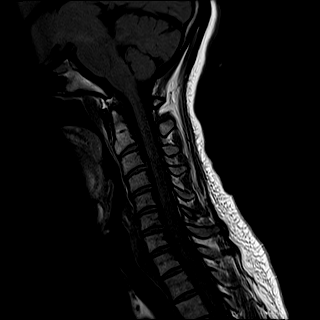
[im 15/15]
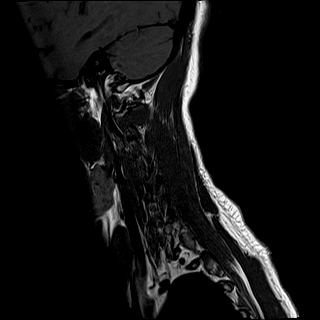

[Series 7: STIR · sagittal · 3.0mm · 0.86mm/px · 3 of 15 slices shown]
[im 1/15]
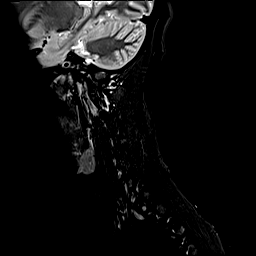
[im 8/15]
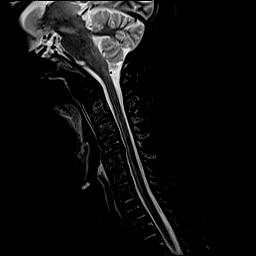
[im 15/15]
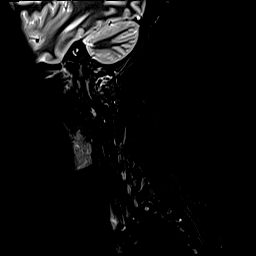

[Series 8: T2 · axial · 3.0mm · 0.66mm/px · z∈[-229,-104]mm · 9 of 40 slices shown (2 of 2)]
[im 1/40]
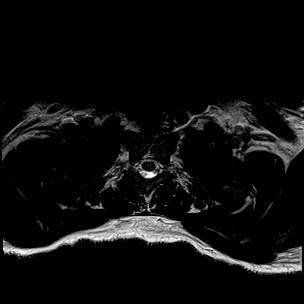
[im 5/40]
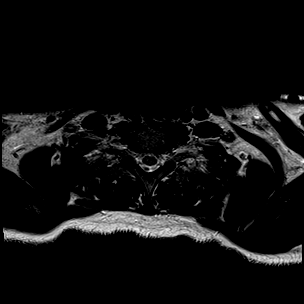
[im 10/40]
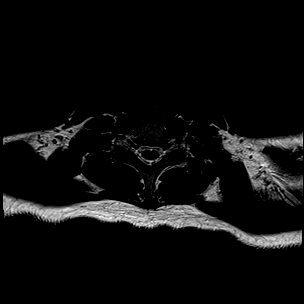
[im 15/40]
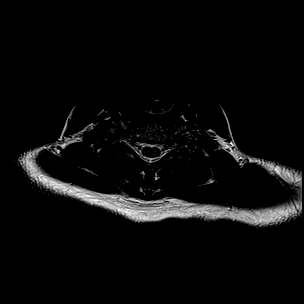
[im 20/40]
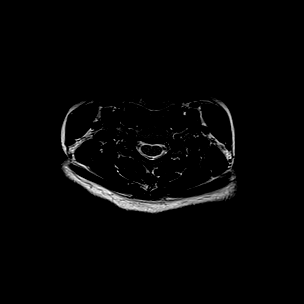
[im 25/40]
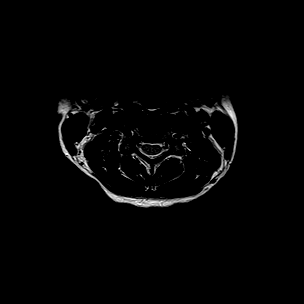
[im 30/40]
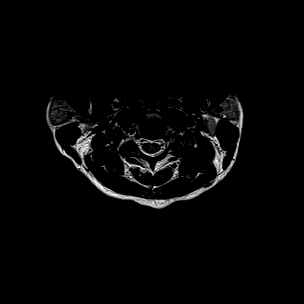
[im 35/40]
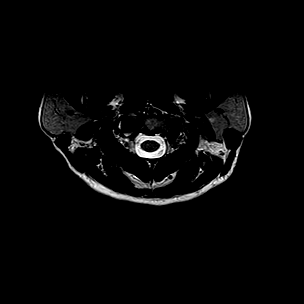
[im 40/40]
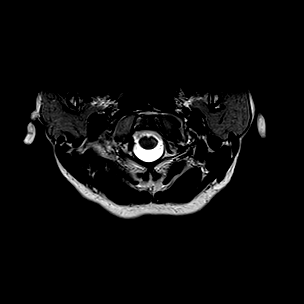

[Series 10: T1 · axial · 3.0mm · 0.35mm/px · z∈[-227,-101]mm · 9 of 40 slices shown (2 of 2)]
[im 1/40]
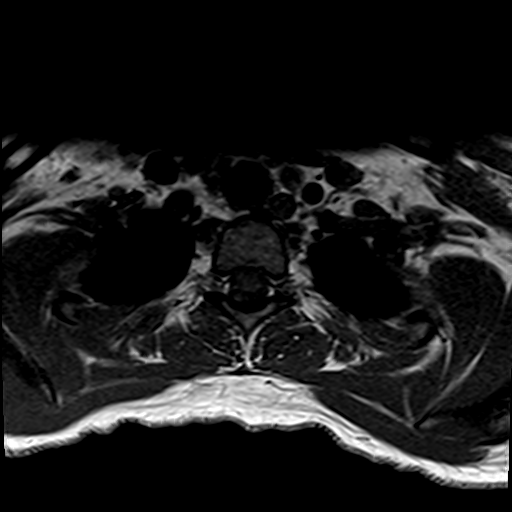
[im 5/40]
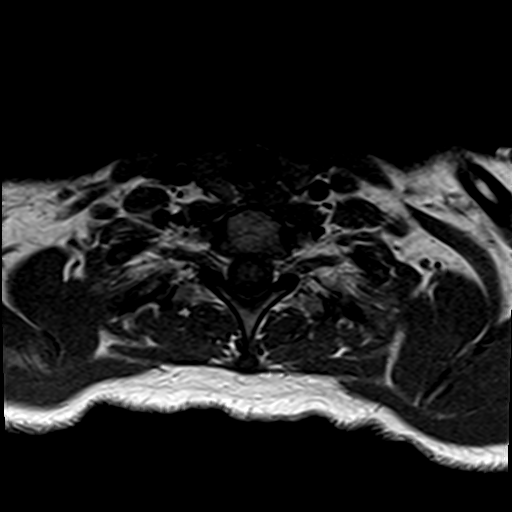
[im 10/40]
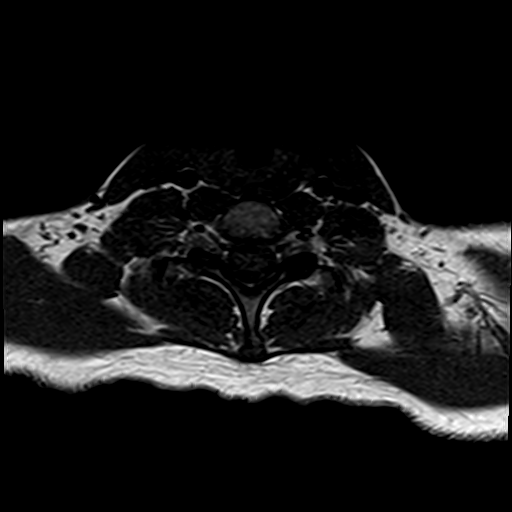
[im 15/40]
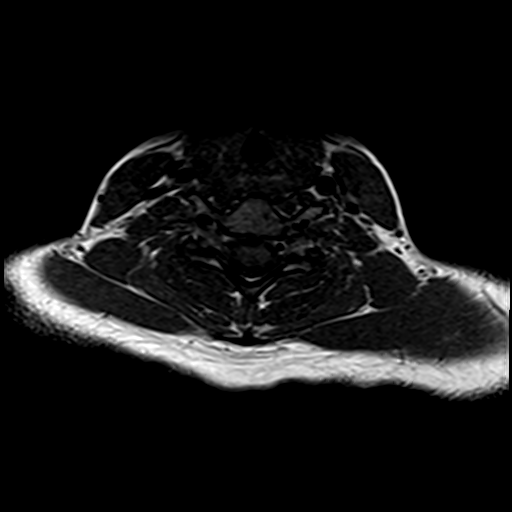
[im 20/40]
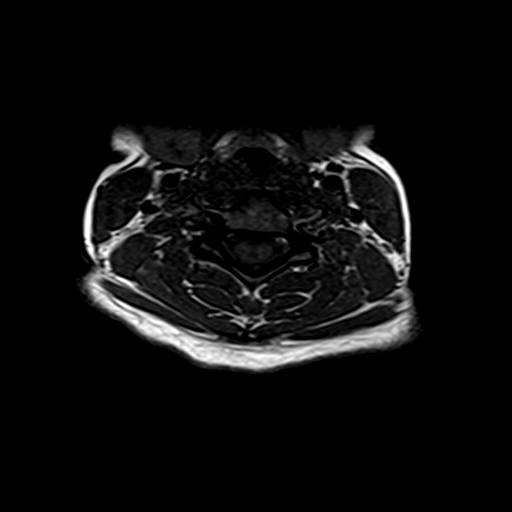
[im 25/40]
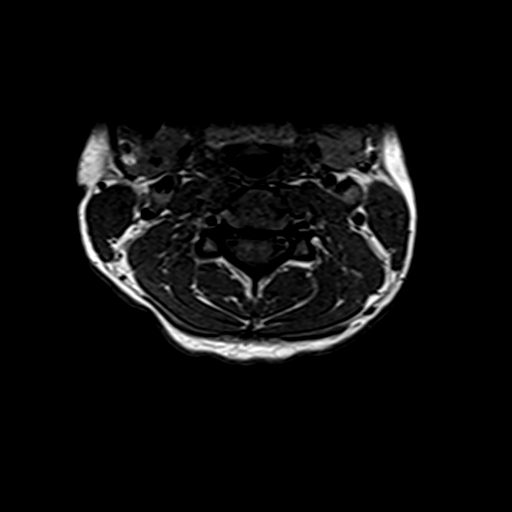
[im 30/40]
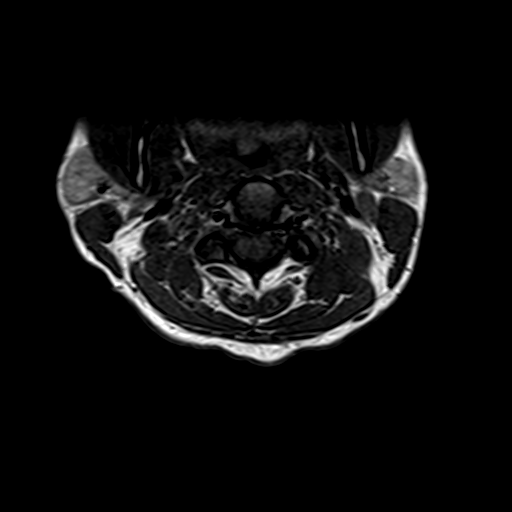
[im 35/40]
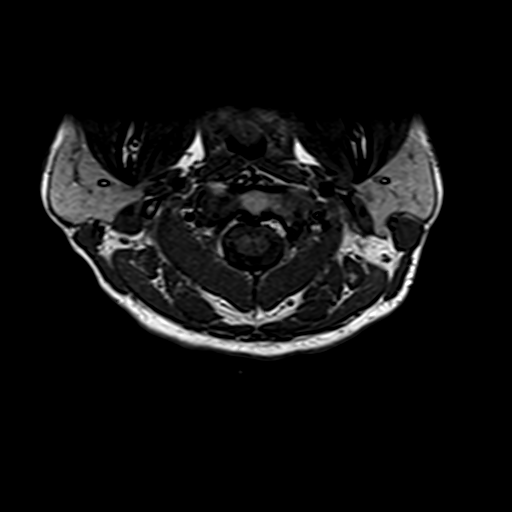
[im 40/40]
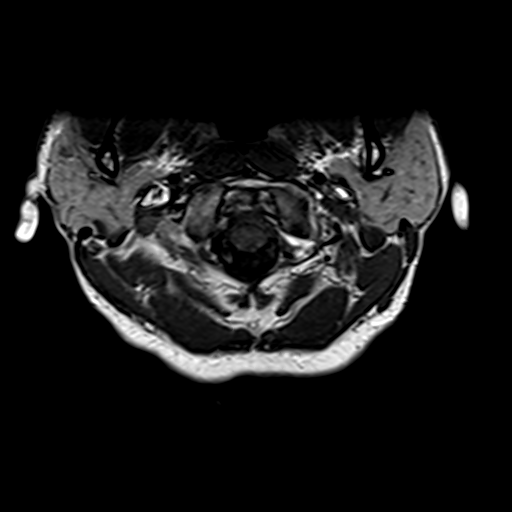

[Series 11: T1 post-contrast · sagittal · 3.0mm · 0.43mm/px · 2 of 15 slices shown]
[im 1/15]
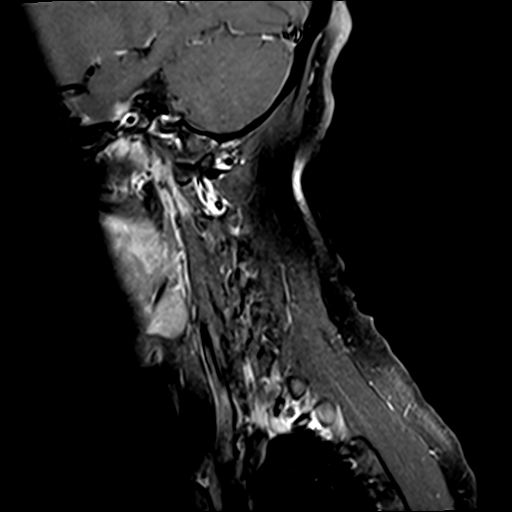
[im 8/15]
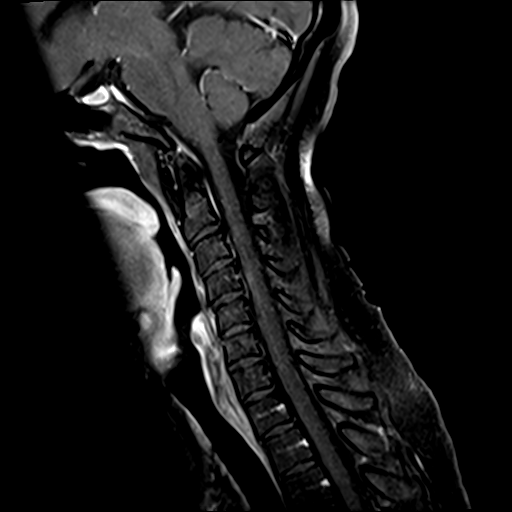

[29 of 48 positions shown; findings below may reference images not displayed]

FINDINGS: MRI CERVICAL SPINE FINDINGS

Alignment: Straightening of the normal cervical lordosis, which may
be positional.

Vertebrae: No fracture, evidence of discitis, or bone lesion. No
abnormal enhancement.

Cord: Normal signal and morphology.  No abnormal enhancement.

Posterior Fossa, vertebral arteries, paraspinal tissues: Negative.

Disc levels:

No high-grade spinal canal stenosis or neural foraminal narrowing.
Mild disc bulge at C4-C5, with mild facet arthropathy and
uncovertebral hypertrophy, causes mild bilateral neural foraminal
narrowing.

MRI THORACIC SPINE FINDINGS

Alignment:  Physiologic.

Vertebrae: No fracture, evidence of discitis, or bone lesion. No
abnormal enhancement.

Cord:  Normal signal and morphology.  No abnormal enhancement.

Paraspinal and other soft tissues: Negative.

Disc levels:

No significant degenerative changes. No spinal canal stenosis or
neural foraminal narrowing.

MRI LUMBAR SPINE FINDINGS

Segmentation:  Standard.

Alignment:  No listhesis

Vertebrae: No fracture, evidence of discitis, or bone lesion. No
abnormal enhancement.

Conus medullaris and cauda equina: Conus extends to the L2-L3 level,
low lying. Conus and cauda equina appear normal. No abnormal
enhancement.

Paraspinal and other soft tissues: Negative.

Disc levels:

L5-S1 disc desiccation with mild disc bulge with superimposed
central/left paracentral protrusion, which narrows the left lateral
recess and may contact the descending left L1 nerve. No spinal canal
stenosis or neural foraminal narrowing in the lumbar spine.
IMPRESSION: 1. No abnormal enhancement in the spinal cord or vertebral bodies.
No evidence of metastatic disease in the spine.
2. Minimal degenerative changes, most prominent at L5-S1, where
there is a central/left paracentral protrusion, which may contact
the descending left L1 nerve.
3. C4-C5 mild bilateral neural foraminal narrowing.
4. No spinal canal stenosis or additional neural foraminal
narrowing.

## 2020-12-04 IMAGING — CT CT HEAD W/O CM
3 series · 17 of 37 positions shown, 19 images · non-contrast
Comparison: None.

CLINICAL DATA: Left-sided weakness. Neuro deficit, acute, stroke
suspected

EXAM:
CT HEAD WITHOUT CONTRAST
TECHNIQUE: Contiguous axial images were obtained from the base of the skull
through the vertex without intravenous contrast.

[Series 2: head wo · axial · 0.41mm/px · z∈[+1011,+1126]mm · 7 of 31 slices shown, 9 images]
[im 4/31  brain]
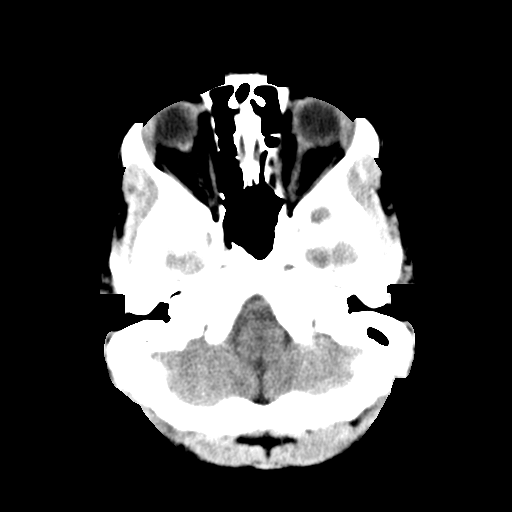
[im 4/31  bone]
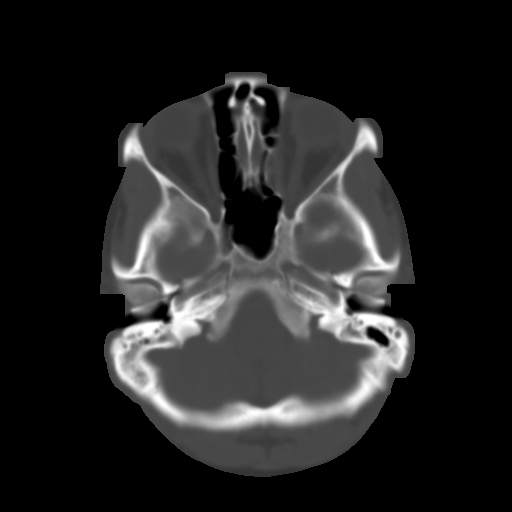
[im 8/31  brain]
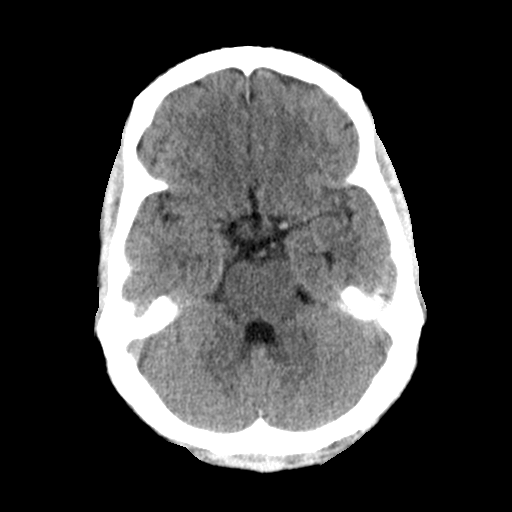
[im 12/31  brain]
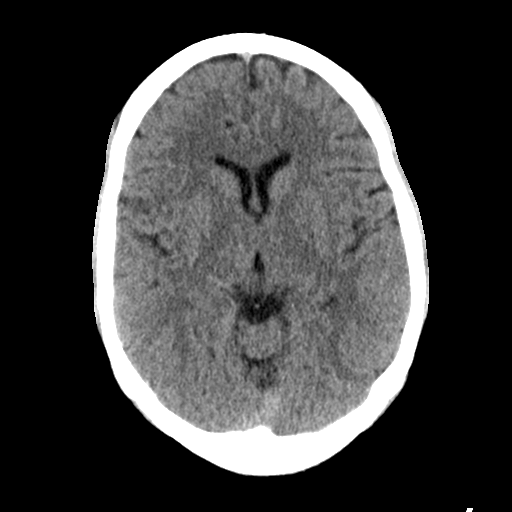
[im 16/31  brain]
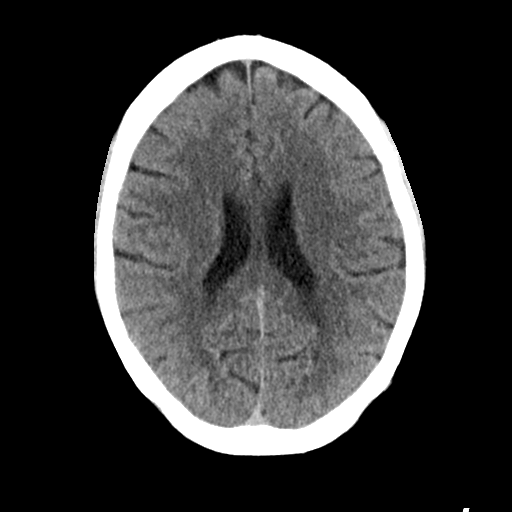
[im 19/31  brain]
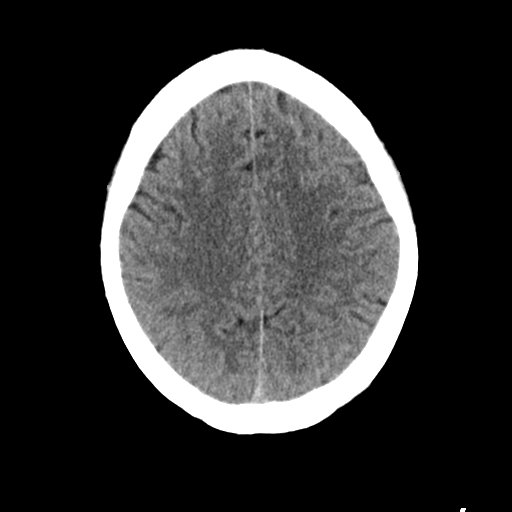
[im 19/31  bone]
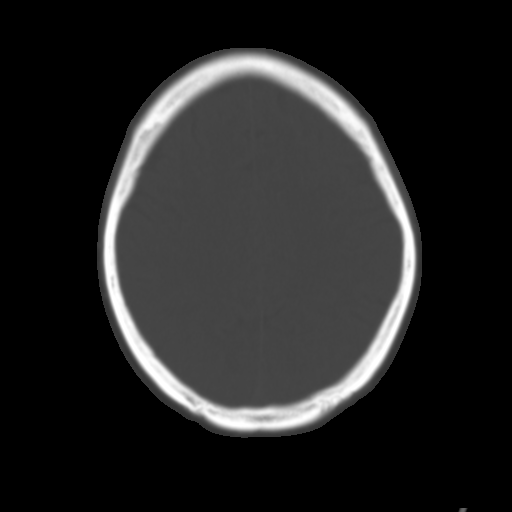
[im 23/31  brain]
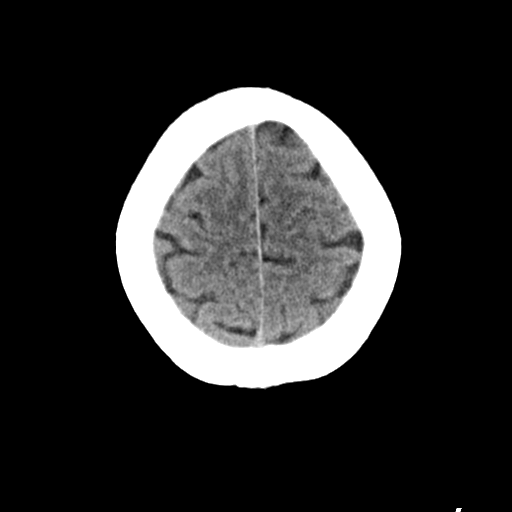
[im 27/31  brain]
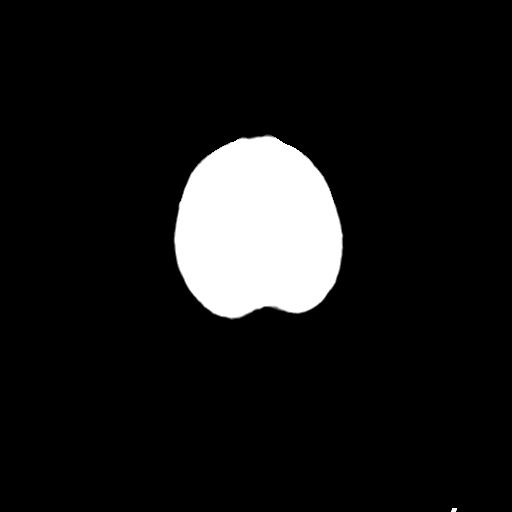

[Series 3: head bone · axial · 0.41mm/px · z∈[+1010,+1116]mm · 7 of 76 slices shown]
[im 8/76  bone]
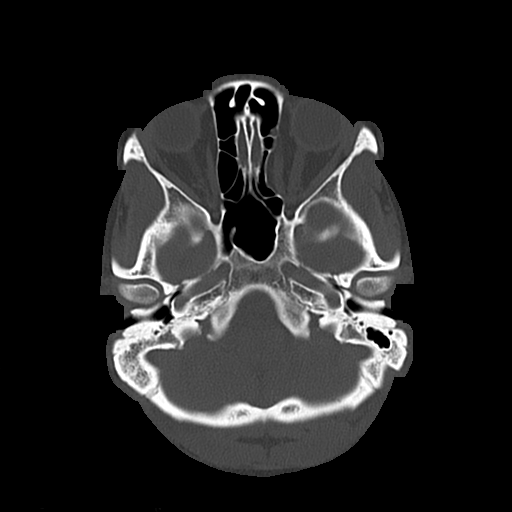
[im 16/76  bone]
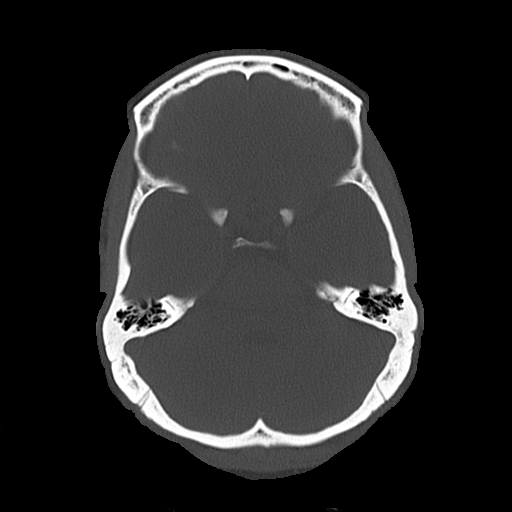
[im 23/76  bone]
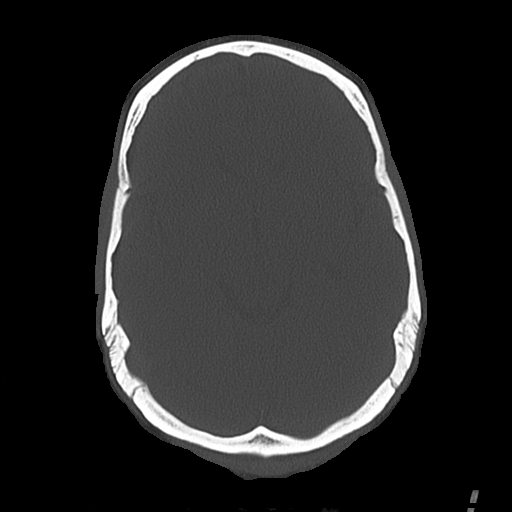
[im 34/76  bone]
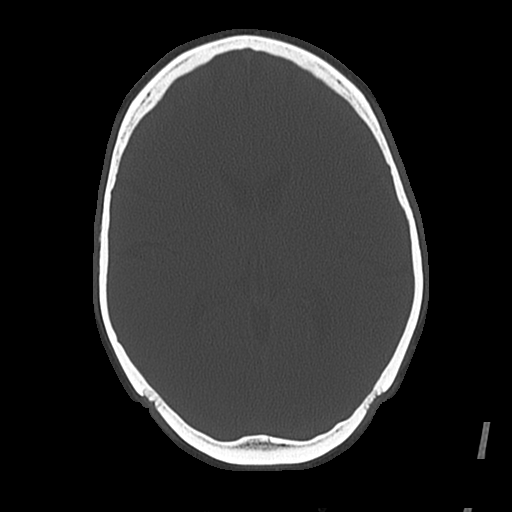
[im 42/76  bone]
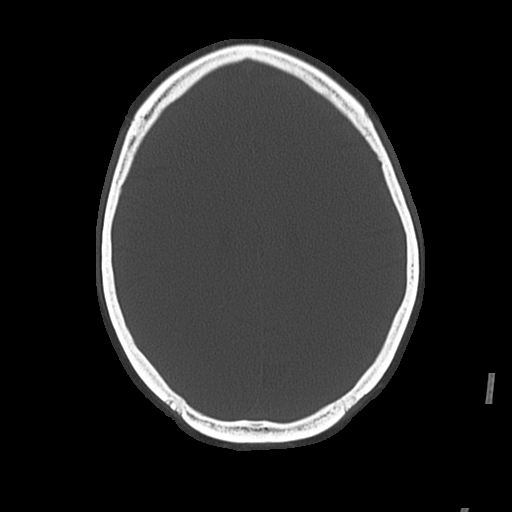
[im 53/76  bone]
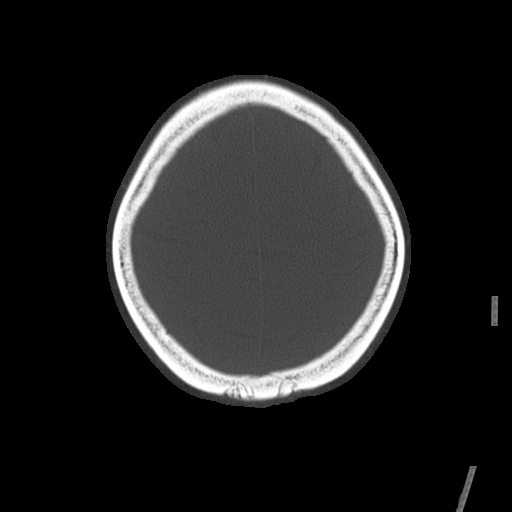
[im 61/76  bone]
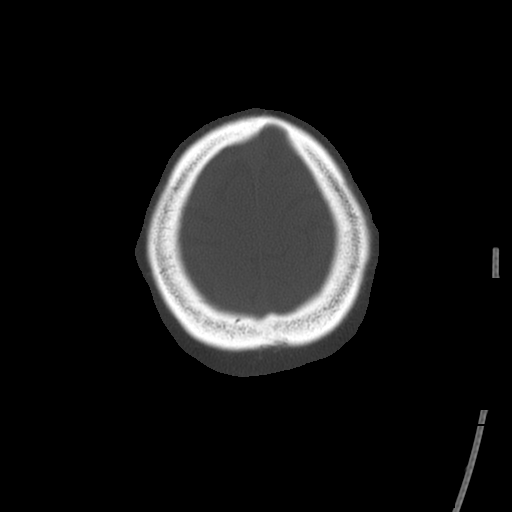

[Series 5: sagittal soft · sagittal · 0.30mm/px · 3 of 54 slices shown]
[im 18/54  brain]
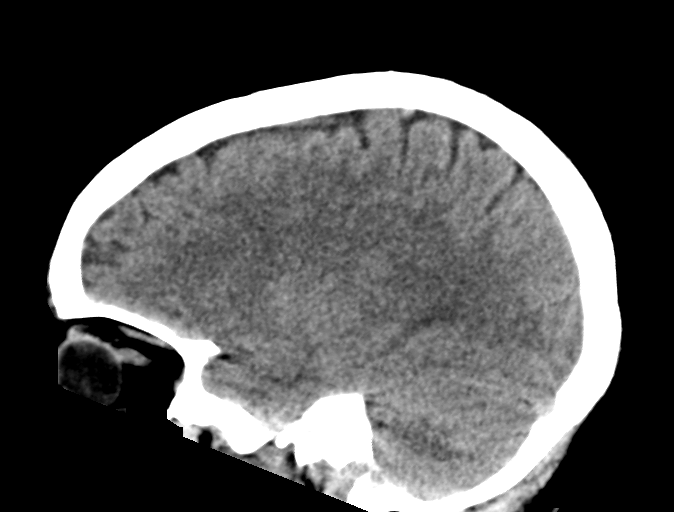
[im 27/54  brain]
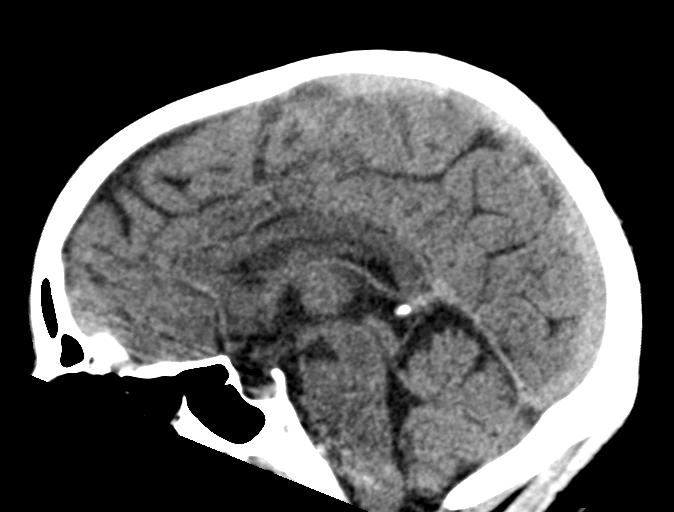
[im 36/54  brain]
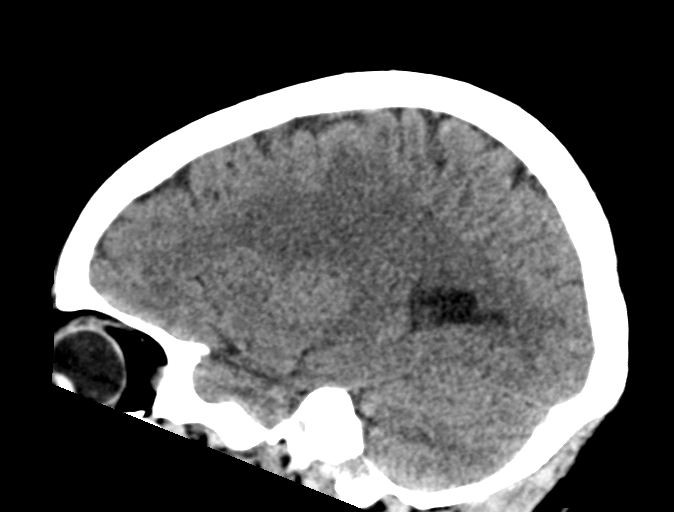

[17 of 37 positions shown; findings below may reference images not displayed]

FINDINGS: Brain: No evidence of acute infarction, hemorrhage, hydrocephalus,
extra-axial collection or mass lesion/mass effect.

Vascular: No hyperdense vessel or unexpected calcification.

Skull: Normal. Negative for fracture or focal lesion.

Sinuses/Orbits: Partially visualized postsurgical changes at the
left orbital floor. Included paranasal sinuses and mastoid air cells
are clear.

Other: None.
IMPRESSION: No acute intracranial findings.

## 2020-12-04 IMAGING — DX DG LUMBAR SPINE COMPLETE 4+V
5 series · 5 of 5 positions shown · non-contrast
Comparison: [DATE]

CLINICAL DATA: Pain

EXAM:
LUMBAR SPINE - COMPLETE 4+ VIEW

[l-spine ap]
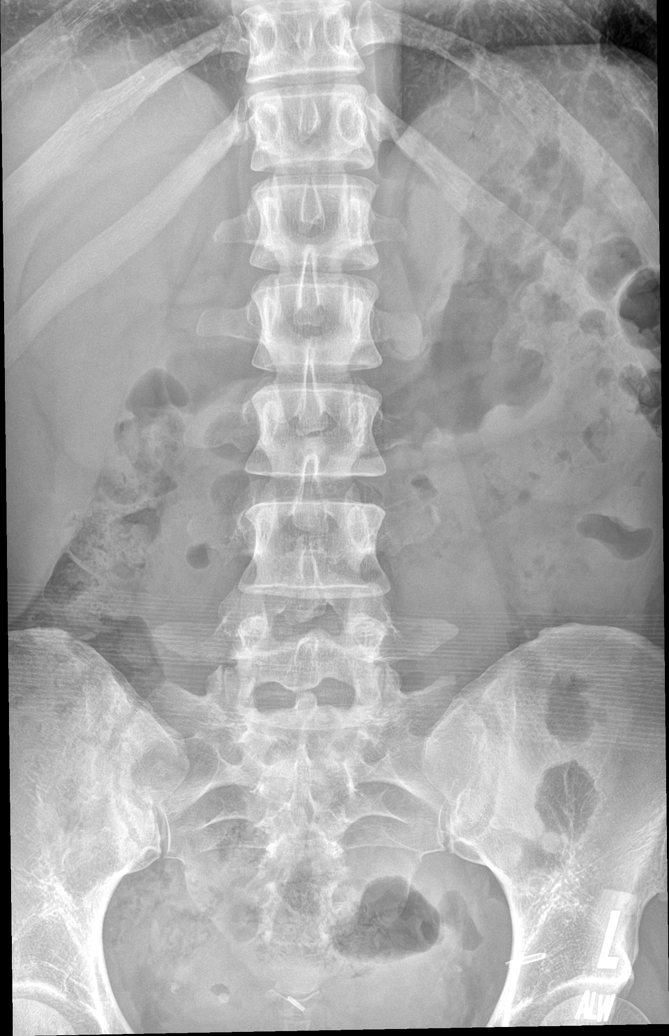

[l-spine obl (1 of 2)]
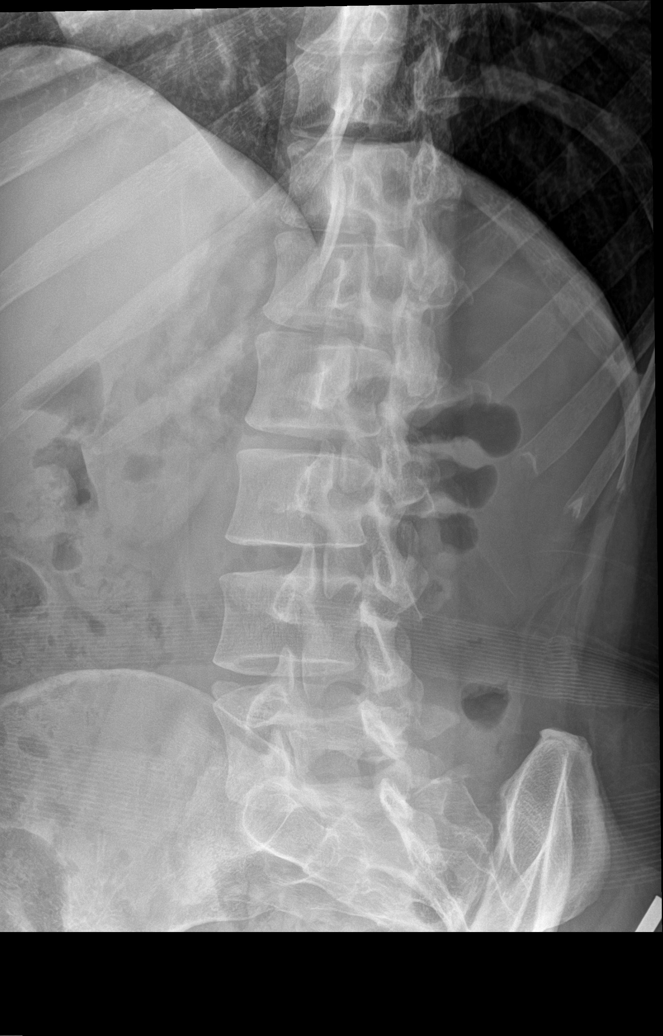

[l-spine obl (2 of 2)]
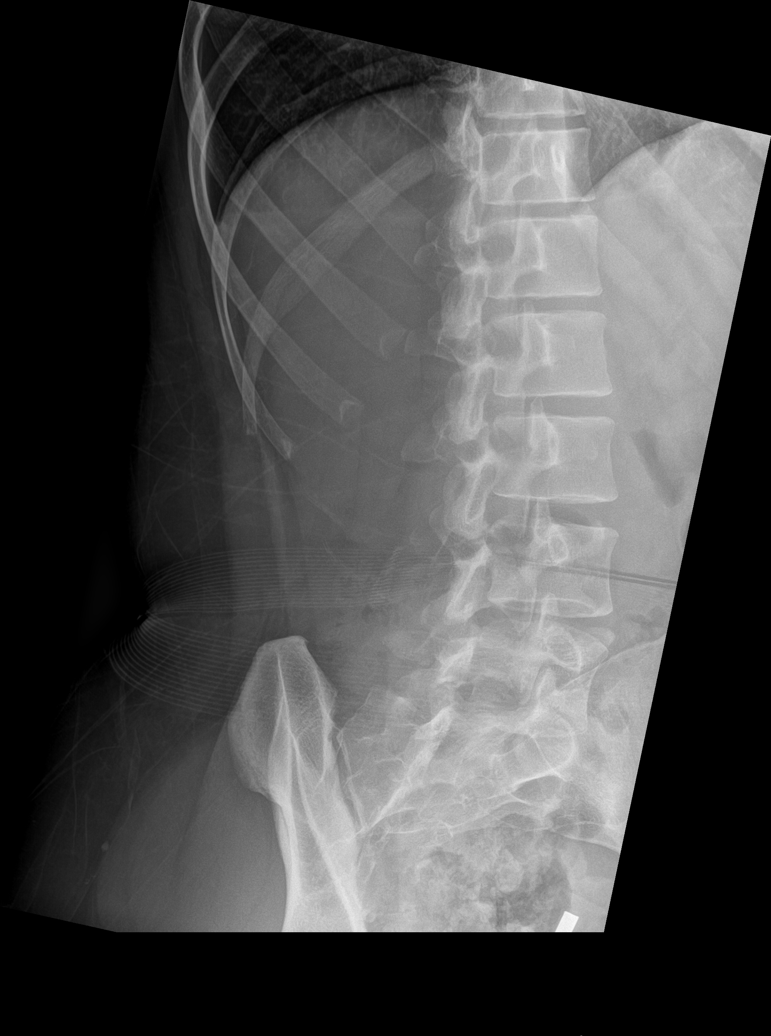

[l-spine lat]
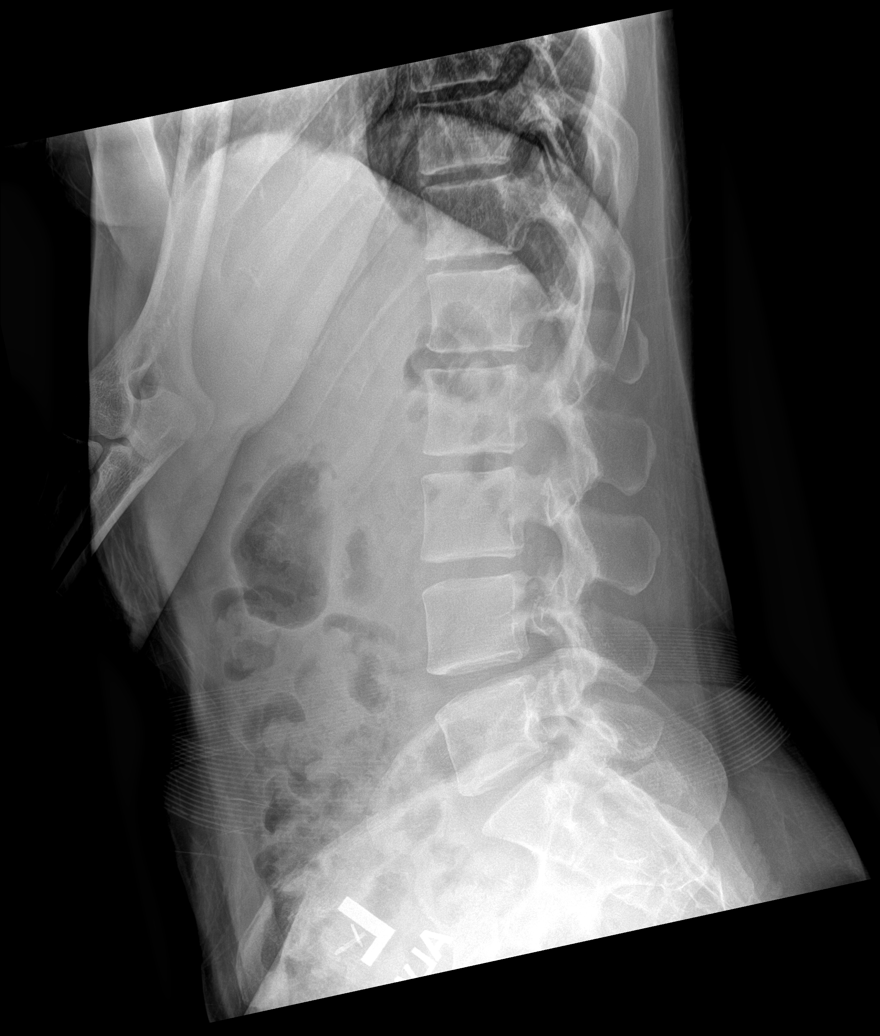

[l-spine spot]
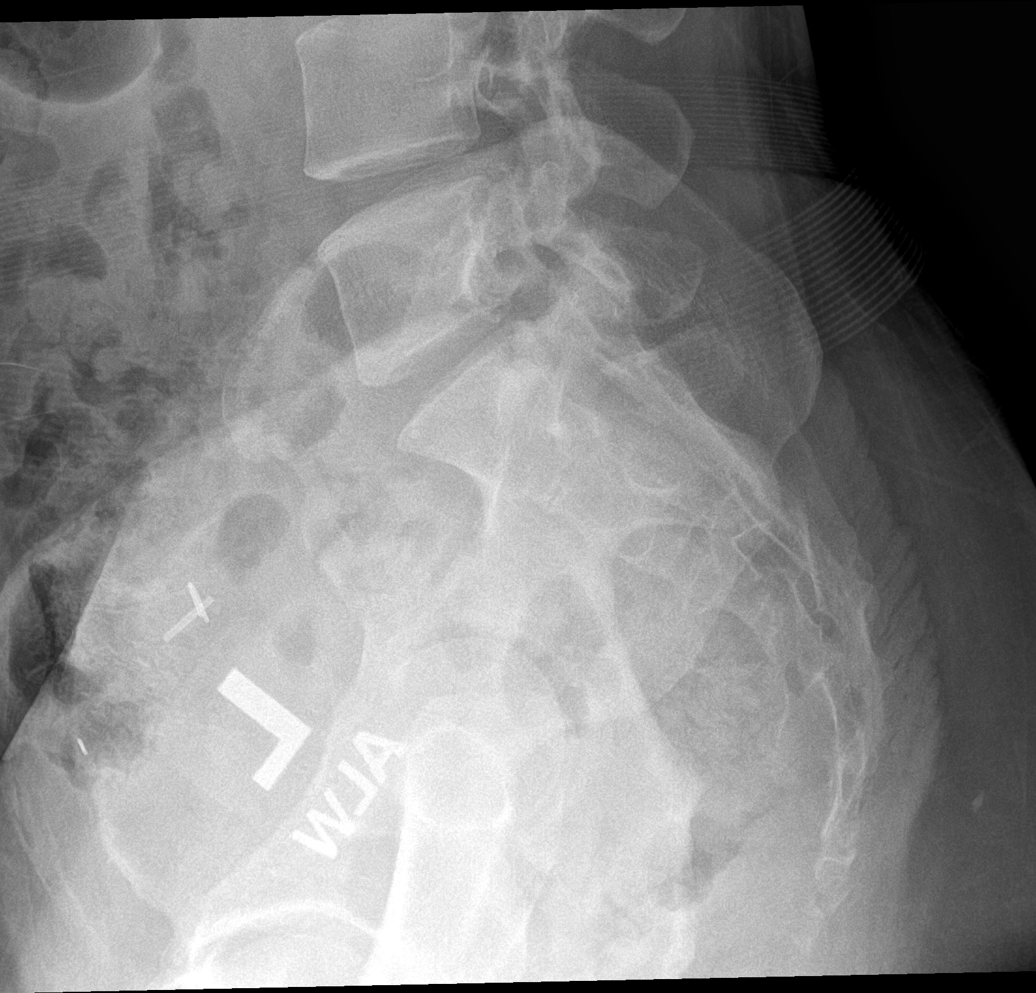

[5 of 5 positions shown; findings below may reference images not displayed]

FINDINGS: There is no evidence of lumbar spine fracture. Alignment is normal.
Intervertebral disc spaces are maintained.
IMPRESSION: Negative.

## 2020-12-04 IMAGING — MR MR LUMBAR SPINE WO/W CM
4 of 7 series · 25 of 48 positions shown · IV contrast (Contrast agent)
Comparison: None.

CLINICAL DATA: Left-sided numbness or tingling, paresthesias,
history cancer.

EXAM:
MRI CERVICAL, THORACIC AND LUMBAR SPINE WITHOUT AND WITH CONTRAST
TECHNIQUE: Multiplanar and multiecho pulse sequences of the cervical spine, to
include the craniocervical junction and cervicothoracic junction,
and thoracic and lumbar spine, were obtained without and with
intravenous contrast.
CONTRAST:  6mL GADAVIST GADOBUTROL 1 MMOL/ML IV SOLN

[Series 5: T2 · sagittal · 4.0mm · 0.73mm/px · 6 of 16 slices shown (1 of 2)]
[im 1/16]
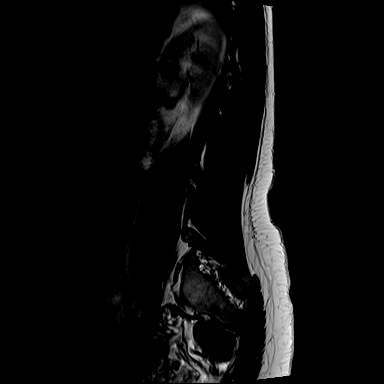
[im 4/16]
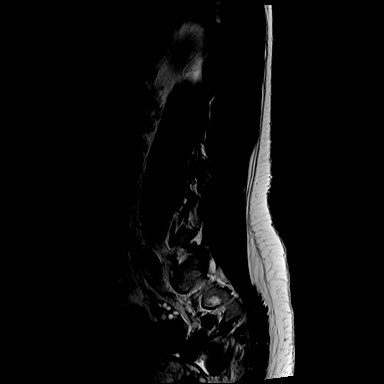
[im 7/16]
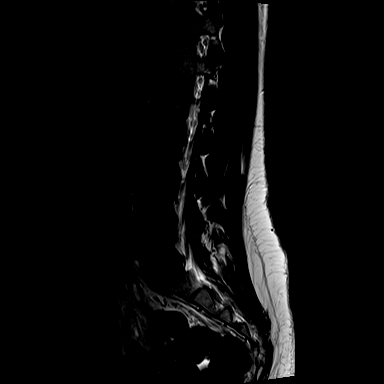
[im 10/16]
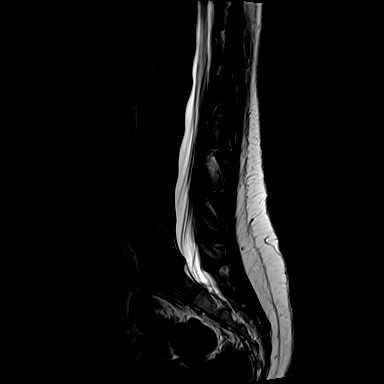
[im 13/16]
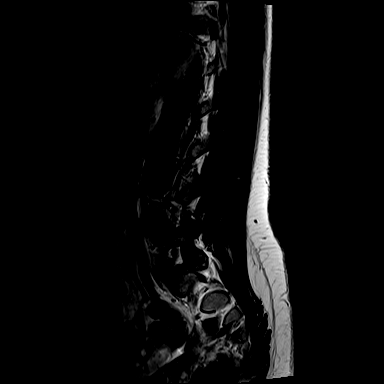
[im 16/16]
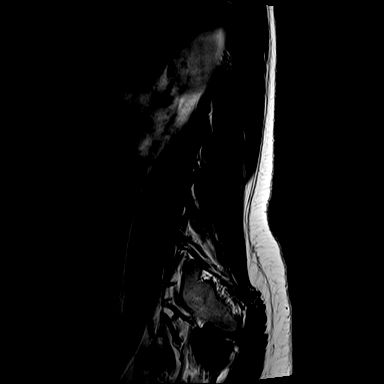

[Series 7: T1 · sagittal · 4.0mm · 0.88mm/px · 5 of 16 slices shown (1 of 2)]
[im 1/16]
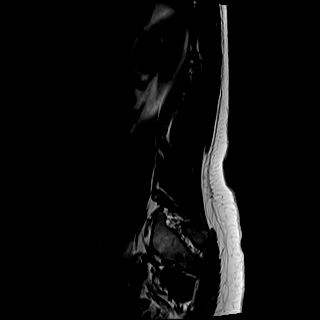
[im 4/16]
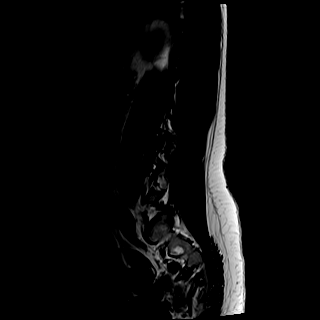
[im 8/16]
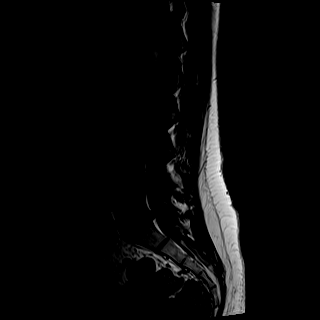
[im 12/16]
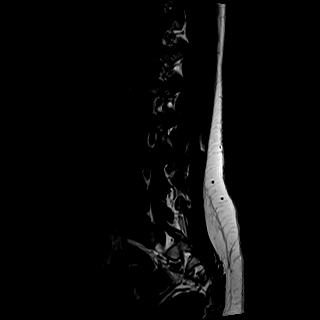
[im 16/16]
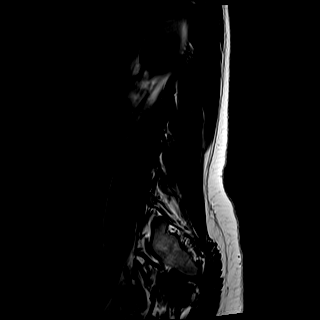

[Series 8: T2 · axial · 5.0mm · 0.57mm/px · z∈[-610,-377]mm · 9 of 31 slices shown (2 of 2)]
[im 1/31]
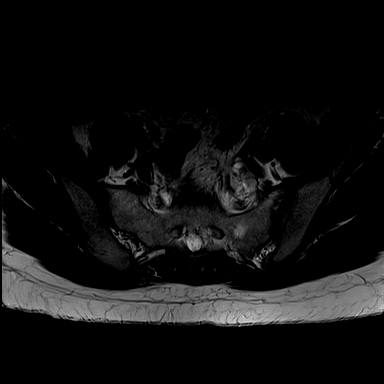
[im 4/31]
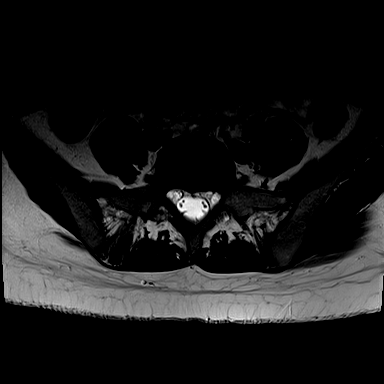
[im 8/31]
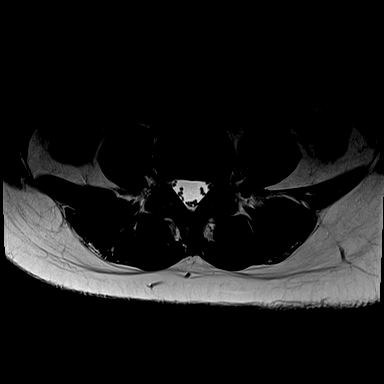
[im 12/31]
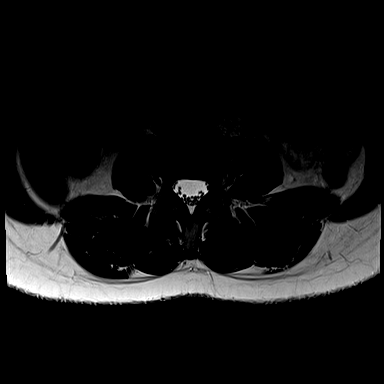
[im 16/31]
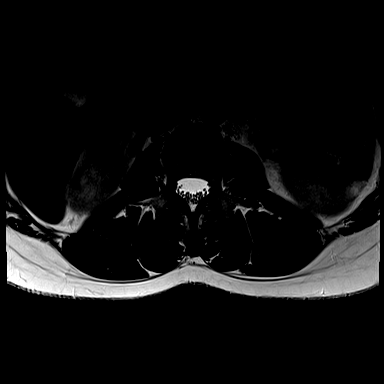
[im 19/31]
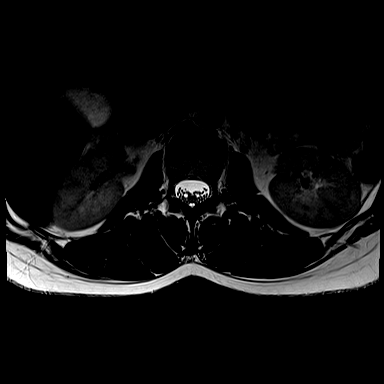
[im 23/31]
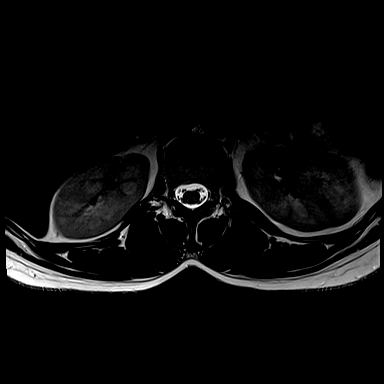
[im 27/31]
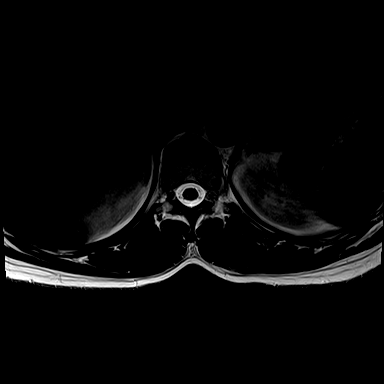
[im 31/31]
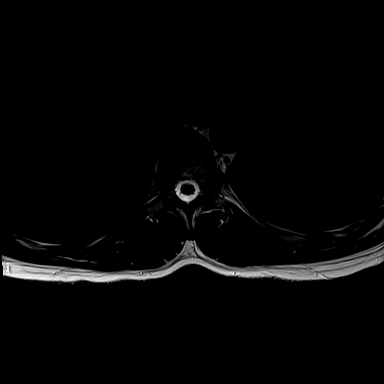

[Series 9: T1 · axial · 5.0mm · 0.34mm/px · z∈[-610,-407]mm · 5 of 31 slices shown (2 of 2)]
[im 1/31]
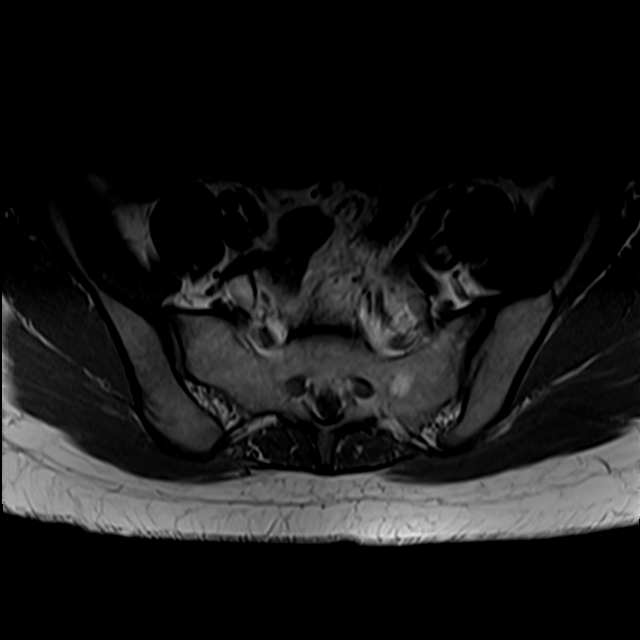
[im 4/31]
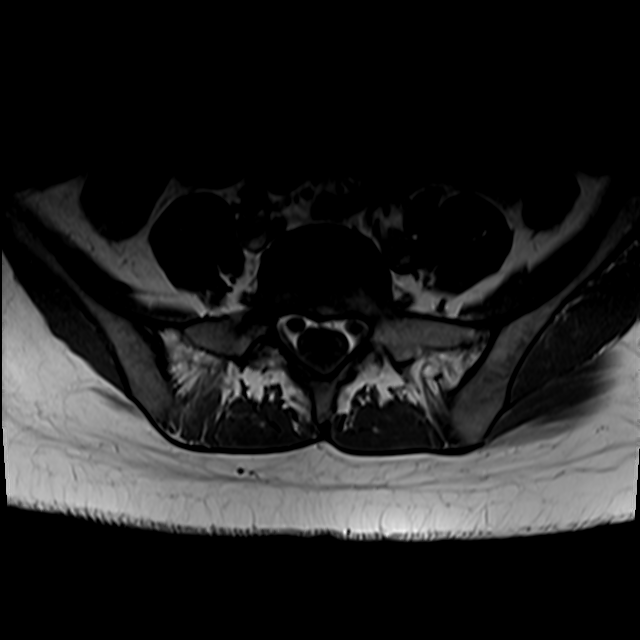
[im 8/31]
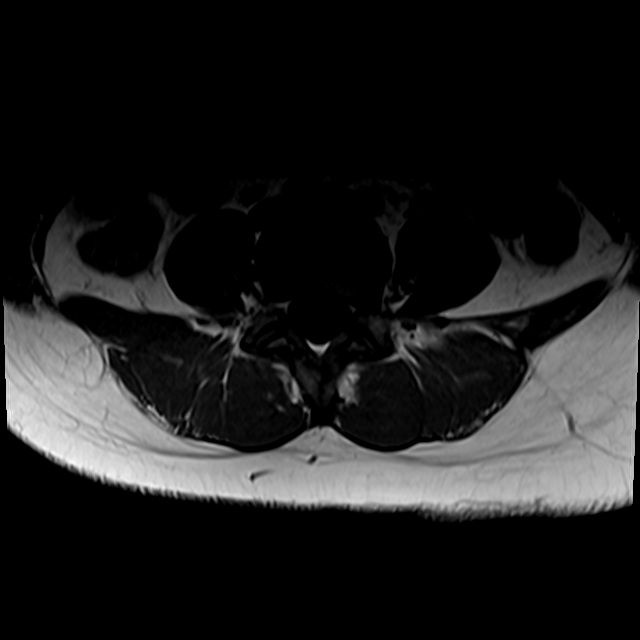
[im 16/31]
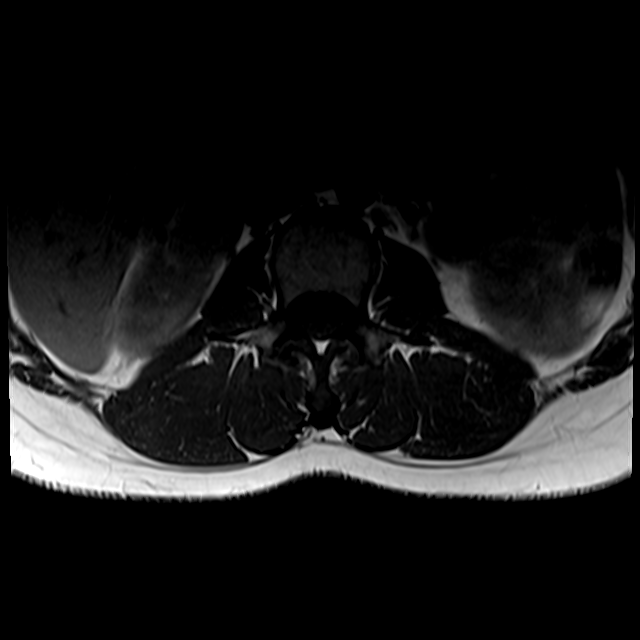
[im 27/31]
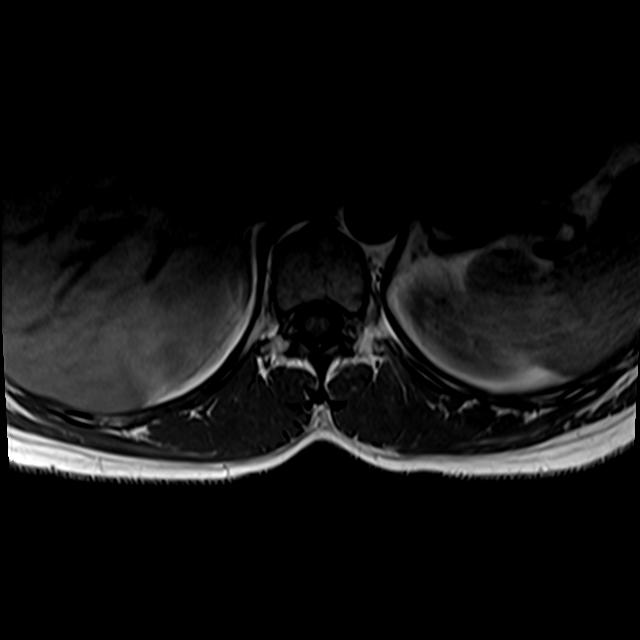

[25 of 48 positions shown; findings below may reference images not displayed]

FINDINGS: MRI CERVICAL SPINE FINDINGS

Alignment: Straightening of the normal cervical lordosis, which may
be positional.

Vertebrae: No fracture, evidence of discitis, or bone lesion. No
abnormal enhancement.

Cord: Normal signal and morphology.  No abnormal enhancement.

Posterior Fossa, vertebral arteries, paraspinal tissues: Negative.

Disc levels:

No high-grade spinal canal stenosis or neural foraminal narrowing.
Mild disc bulge at C4-C5, with mild facet arthropathy and
uncovertebral hypertrophy, causes mild bilateral neural foraminal
narrowing.

MRI THORACIC SPINE FINDINGS

Alignment:  Physiologic.

Vertebrae: No fracture, evidence of discitis, or bone lesion. No
abnormal enhancement.

Cord:  Normal signal and morphology.  No abnormal enhancement.

Paraspinal and other soft tissues: Negative.

Disc levels:

No significant degenerative changes. No spinal canal stenosis or
neural foraminal narrowing.

MRI LUMBAR SPINE FINDINGS

Segmentation:  Standard.

Alignment:  No listhesis

Vertebrae: No fracture, evidence of discitis, or bone lesion. No
abnormal enhancement.

Conus medullaris and cauda equina: Conus extends to the L2-L3 level,
low lying. Conus and cauda equina appear normal. No abnormal
enhancement.

Paraspinal and other soft tissues: Negative.

Disc levels:

L5-S1 disc desiccation with mild disc bulge with superimposed
central/left paracentral protrusion, which narrows the left lateral
recess and may contact the descending left L1 nerve. No spinal canal
stenosis or neural foraminal narrowing in the lumbar spine.
IMPRESSION: 1. No abnormal enhancement in the spinal cord or vertebral bodies.
No evidence of metastatic disease in the spine.
2. Minimal degenerative changes, most prominent at L5-S1, where
there is a central/left paracentral protrusion, which may contact
the descending left L1 nerve.
3. C4-C5 mild bilateral neural foraminal narrowing.
4. No spinal canal stenosis or additional neural foraminal
narrowing.

## 2020-12-04 IMAGING — DX DG SHOULDER 2+V*L*
3 series · 3 of 3 positions shown · non-contrast
Comparison: None.

CLINICAL DATA: Left shoulder pain and weakness

EXAM:
LEFT SHOULDER - 2+ VIEW

[shoulder grashey]
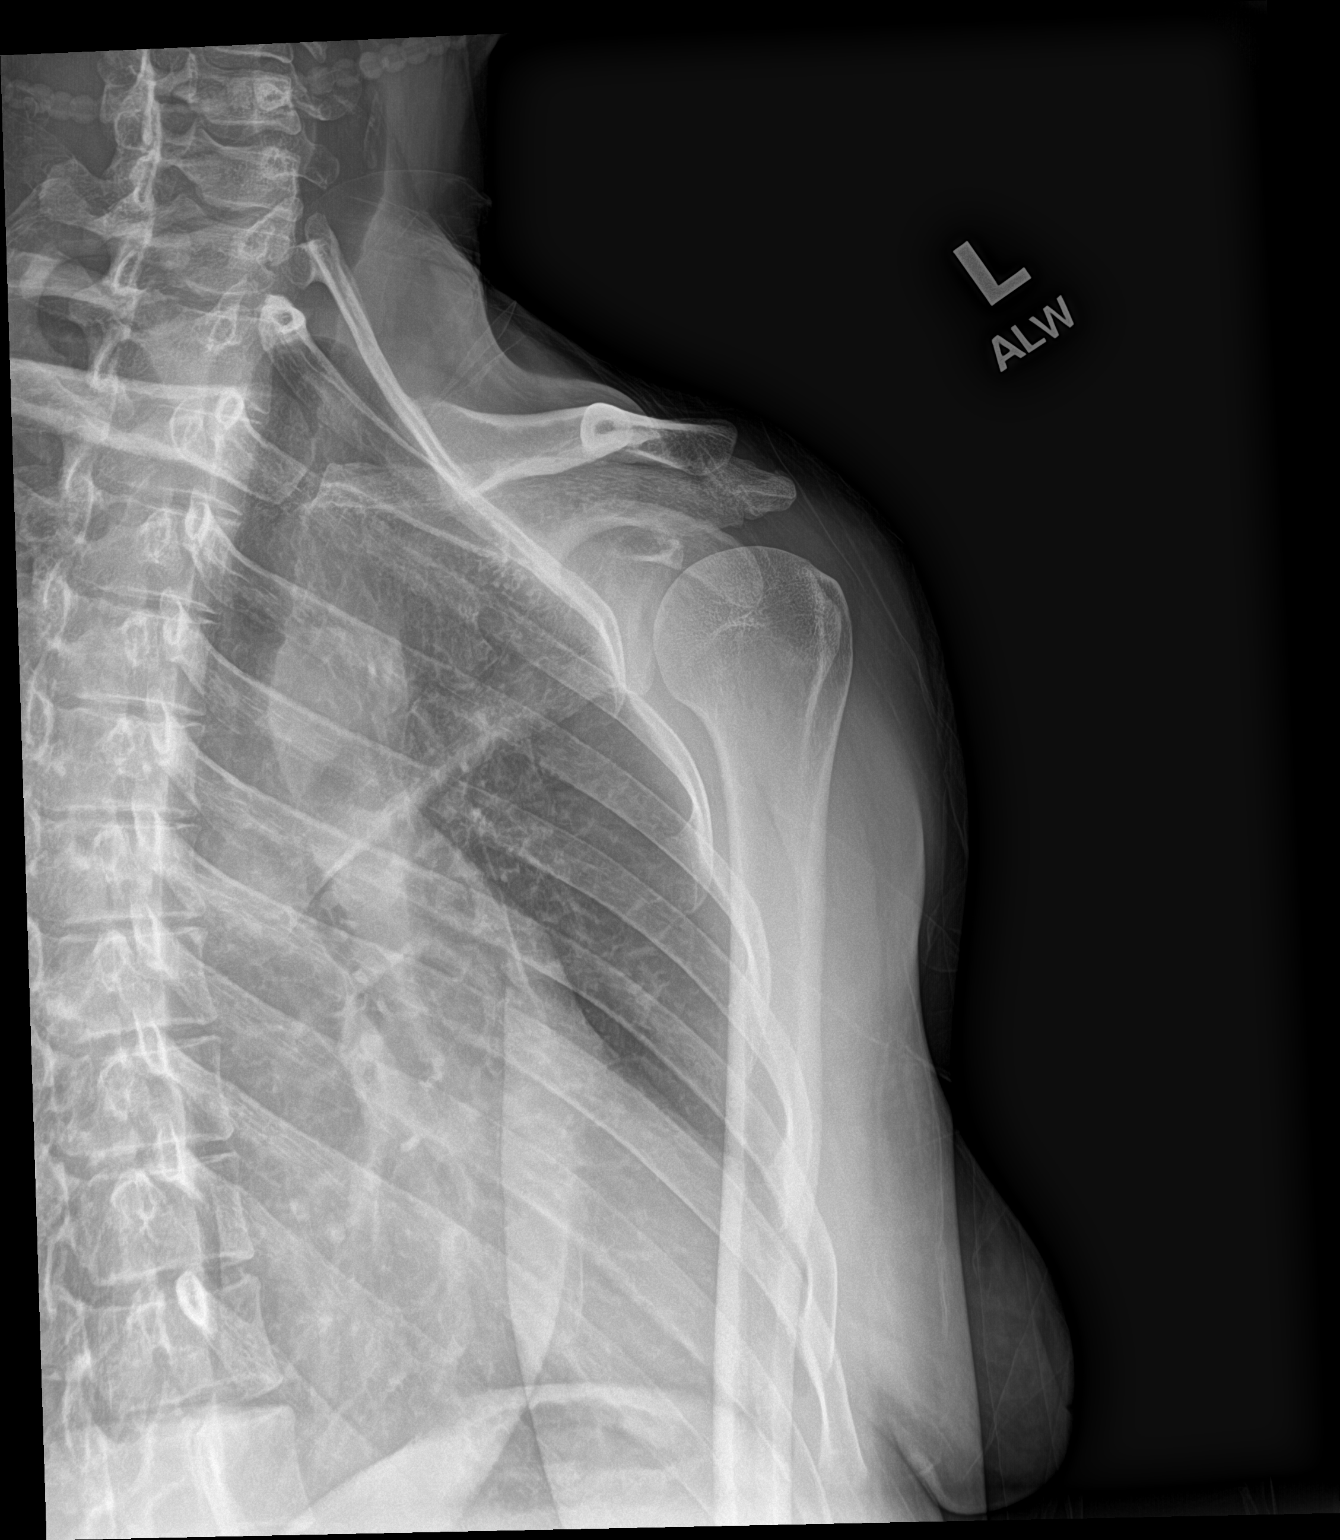

[shoulder axillary]
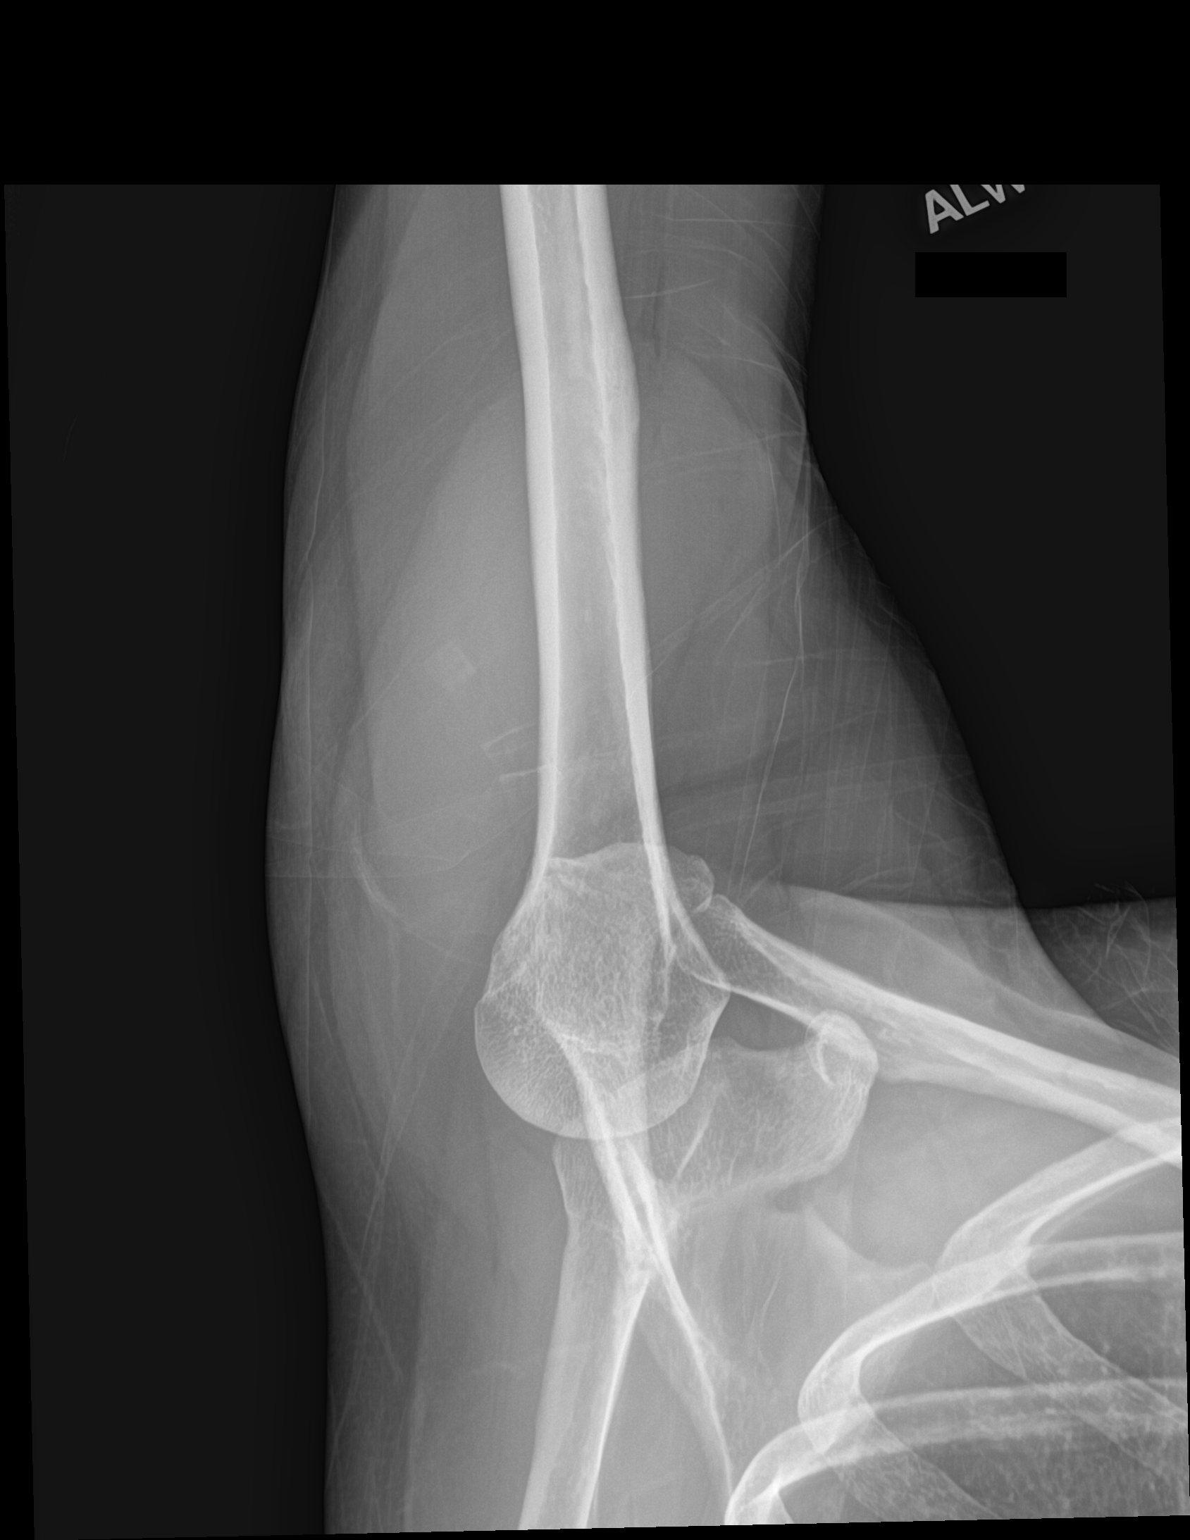

[shoulder y view]
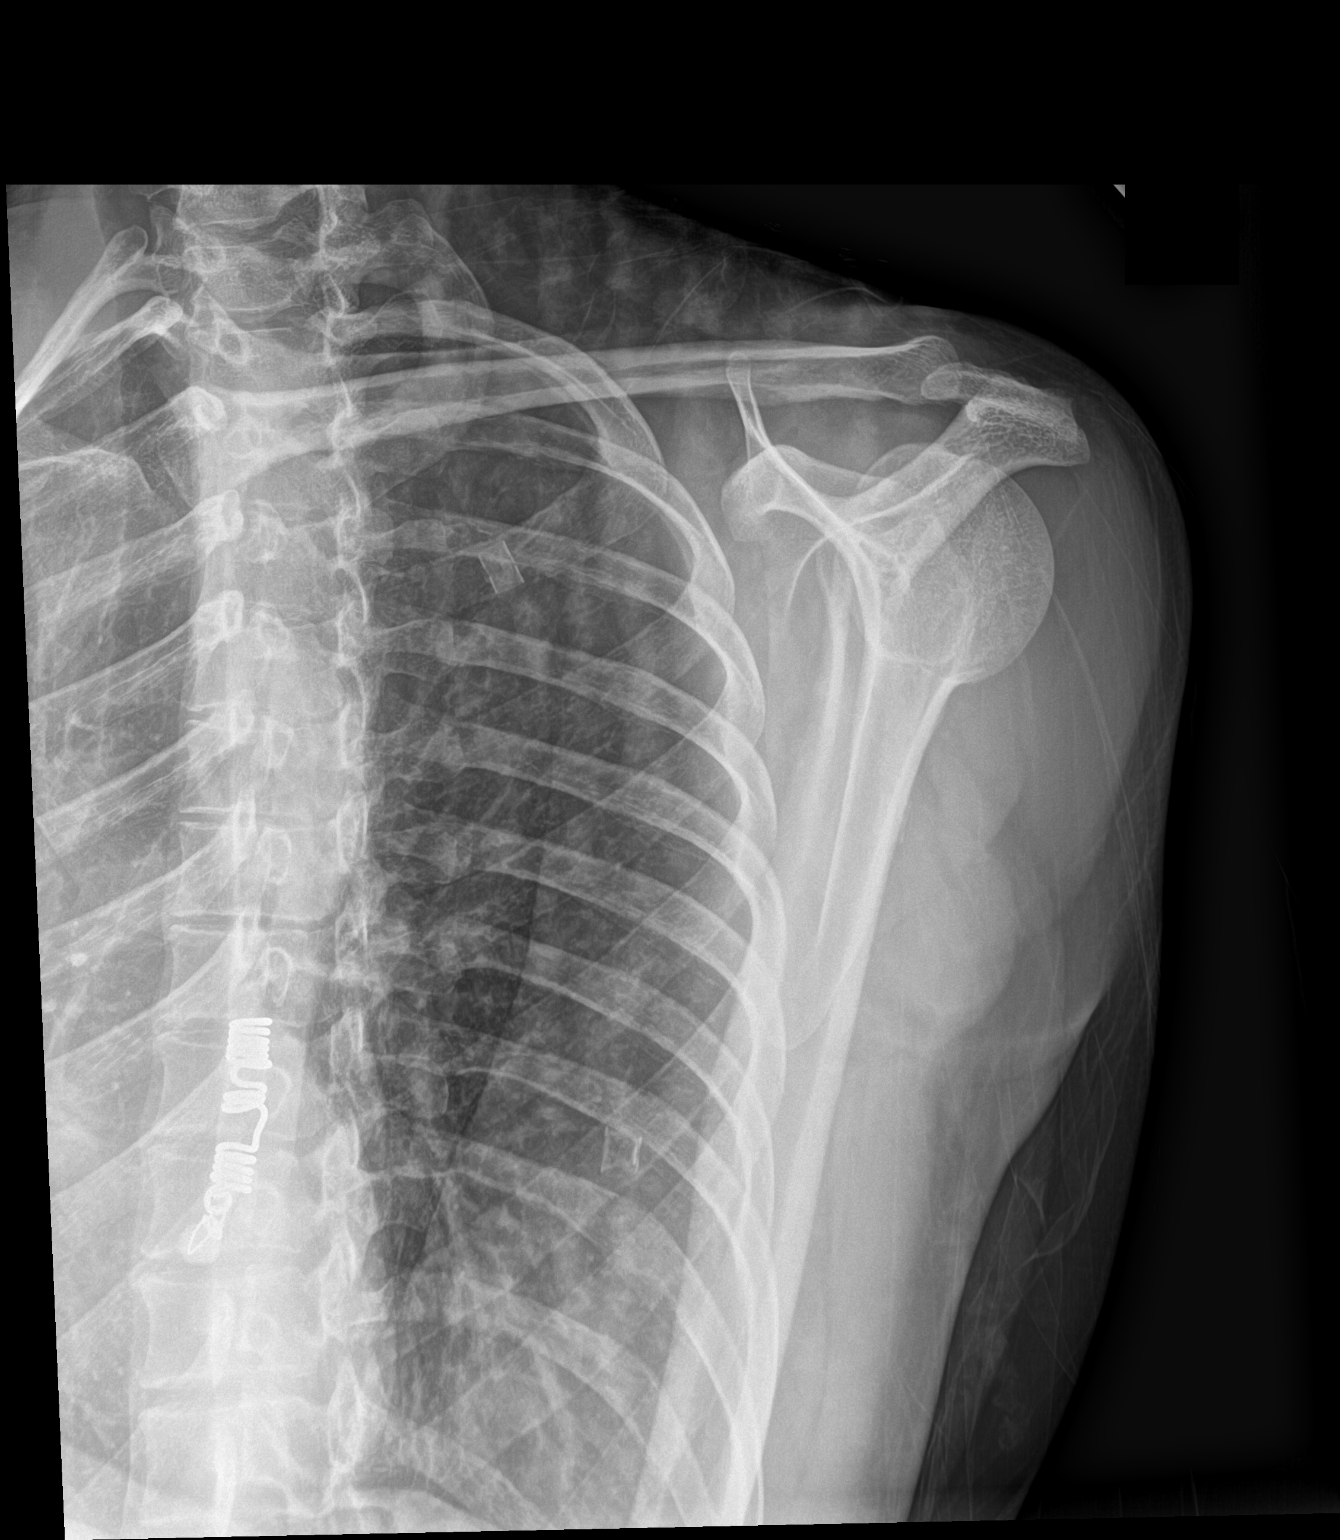

[3 of 3 positions shown; findings below may reference images not displayed]

FINDINGS: There is no evidence of fracture or dislocation. There is no
evidence of arthropathy or other focal bone abnormality. Soft
tissues are unremarkable.
IMPRESSION: Negative.

## 2020-12-04 IMAGING — MR MR THORACIC SPINE WO/W CM
5 of 11 series · 17 of 48 positions shown · IV contrast (gadavist)
Comparison: None.

CLINICAL DATA: Left-sided numbness or tingling, paresthesias,
history cancer.

EXAM:
MRI CERVICAL, THORACIC AND LUMBAR SPINE WITHOUT AND WITH CONTRAST
TECHNIQUE: Multiplanar and multiecho pulse sequences of the cervical spine, to
include the craniocervical junction and cervicothoracic junction,
and thoracic and lumbar spine, were obtained without and with
intravenous contrast.
CONTRAST:  6mL GADAVIST GADOBUTROL 1 MMOL/ML IV SOLN

[Series 5: T1 · sagittal · 3.0mm · 0.62mm/px · 3 of 9 slices shown (1 of 3)]
[im 1/9]
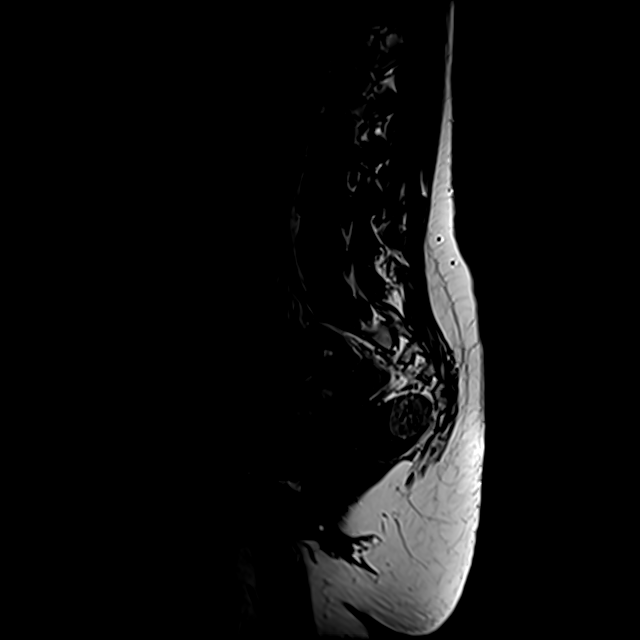
[im 5/9]
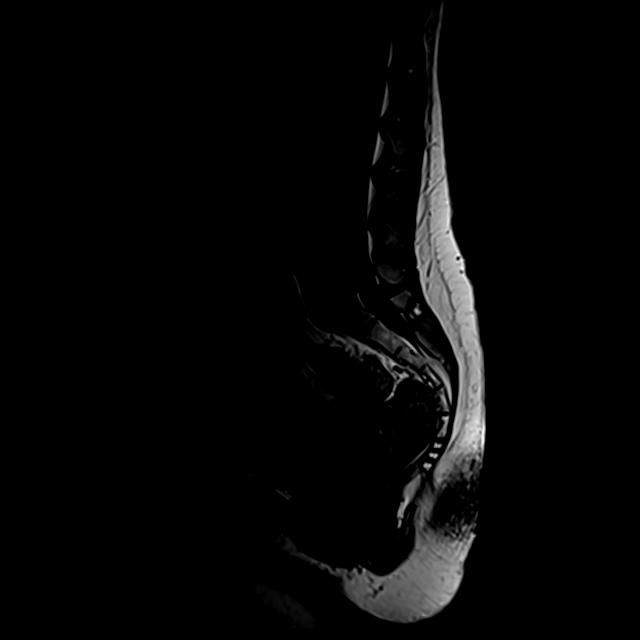
[im 9/9]
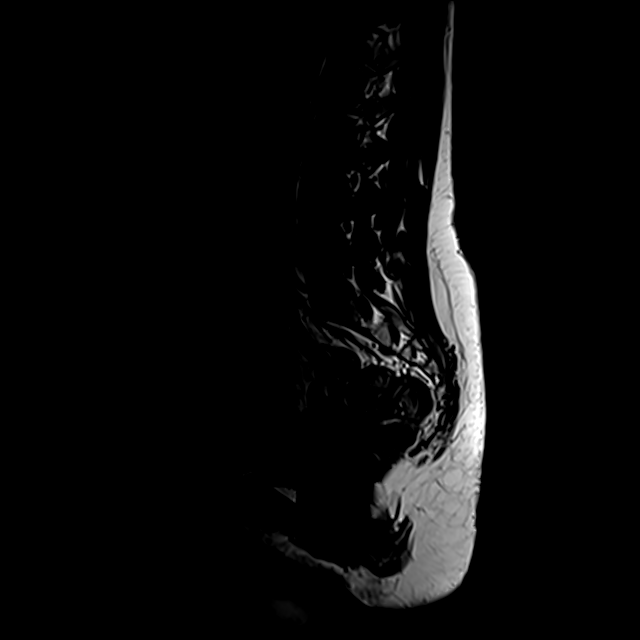

[Series 6: T1 · sagittal · 3.0mm · 0.62mm/px · 3 of 9 slices shown (2 of 3)]
[im 1/9]
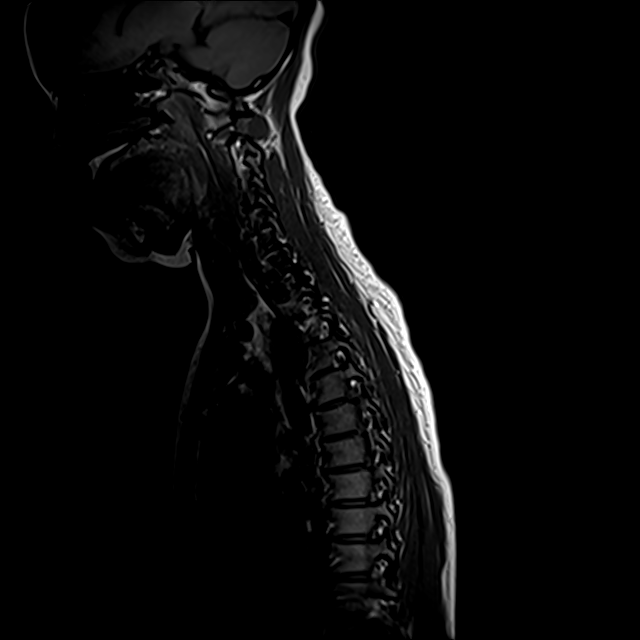
[im 5/9]
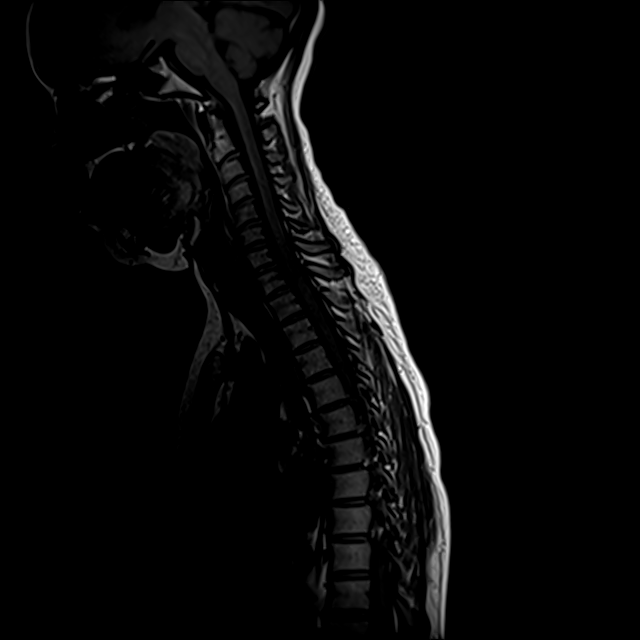
[im 9/9]
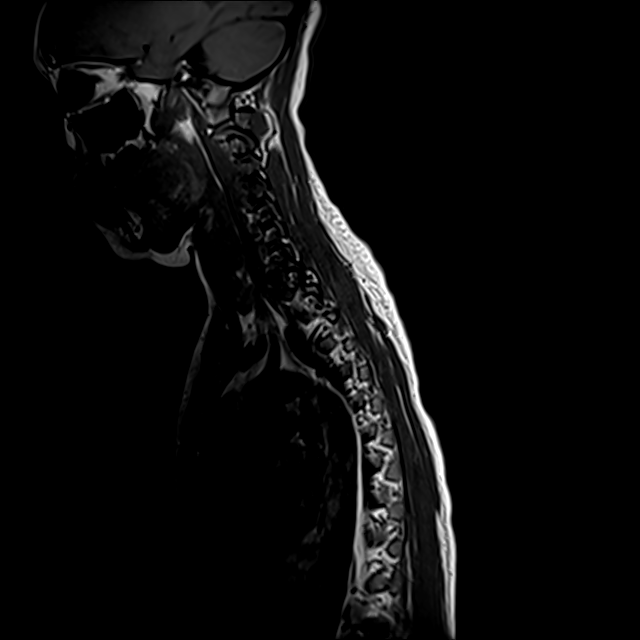

[Series 7: T1 · sagittal · 3.3mm · 0.62mm/px · 1 of 9 slices shown (3 of 3)]
[im 1/9]
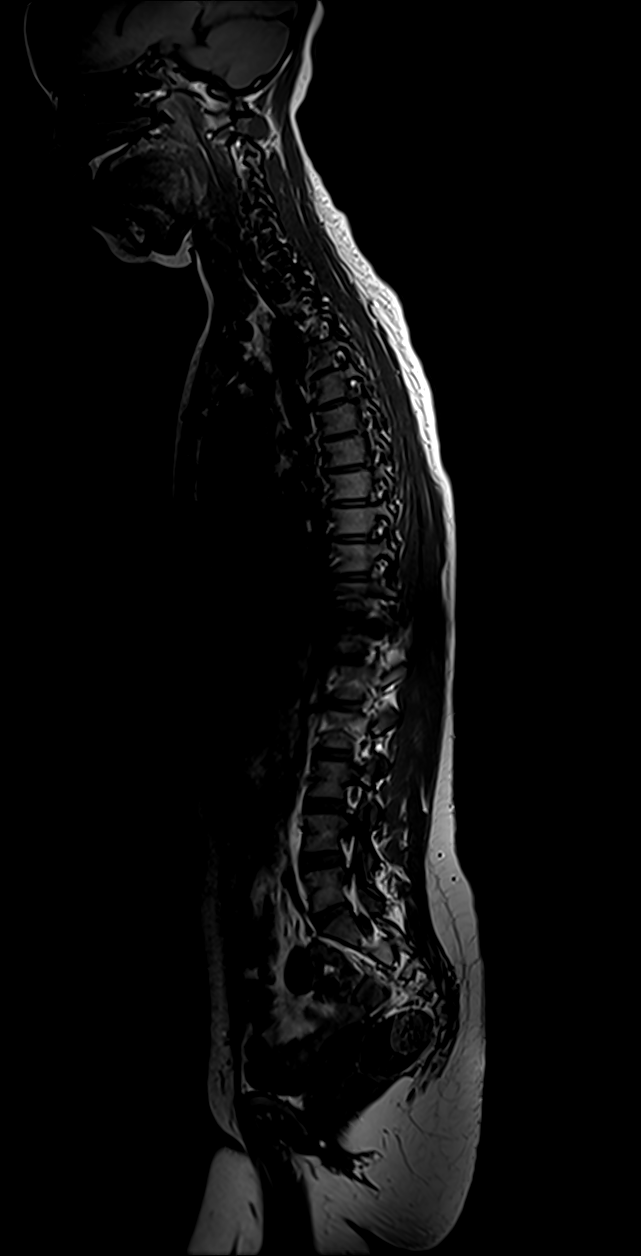

[Series 8: T2 · sagittal · 3.0mm · 0.76mm/px · 3 of 17 slices shown (1 of 2)]
[im 1/17]
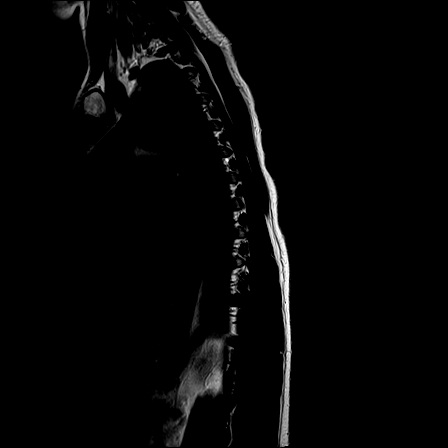
[im 9/17]
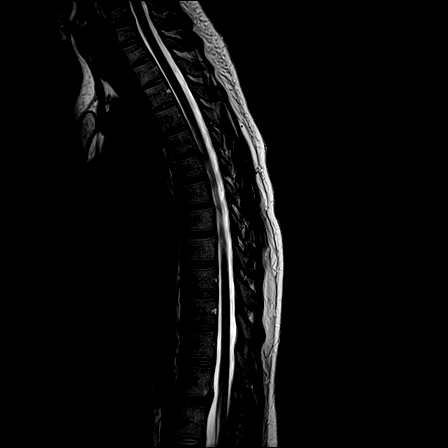
[im 17/17]
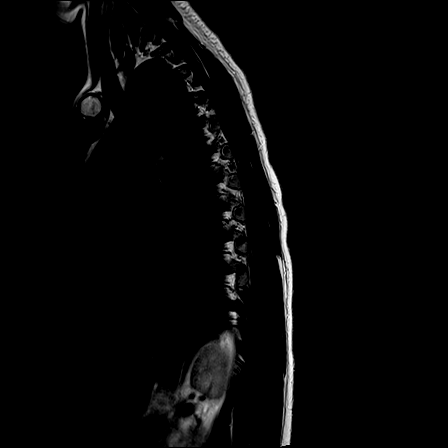

[Series 11: T2 · axial · 5.0mm · 0.59mm/px · z∈[-429,-215]mm · 7 of 39 slices shown (2 of 2)]
[im 1/39]
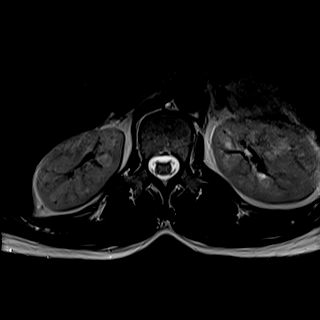
[im 7/39]
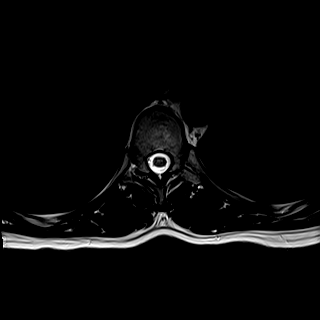
[im 13/39]
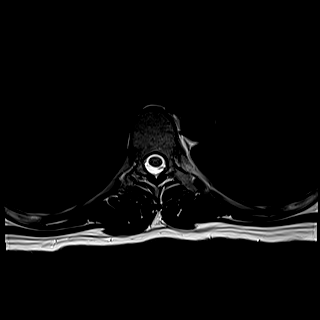
[im 20/39]
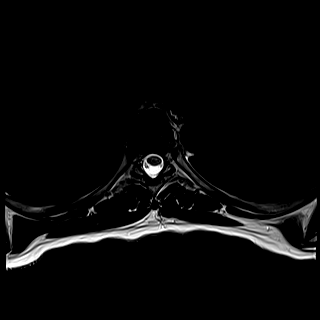
[im 26/39]
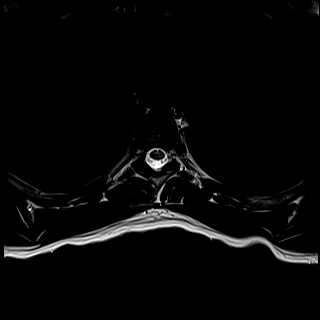
[im 32/39]
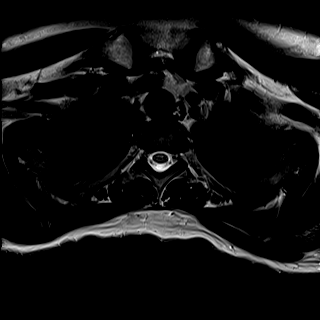
[im 39/39]
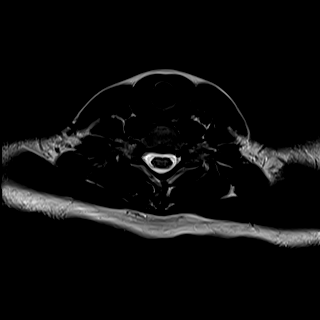

[17 of 48 positions shown; findings below may reference images not displayed]

FINDINGS: MRI CERVICAL SPINE FINDINGS

Alignment: Straightening of the normal cervical lordosis, which may
be positional.

Vertebrae: No fracture, evidence of discitis, or bone lesion. No
abnormal enhancement.

Cord: Normal signal and morphology.  No abnormal enhancement.

Posterior Fossa, vertebral arteries, paraspinal tissues: Negative.

Disc levels:

No high-grade spinal canal stenosis or neural foraminal narrowing.
Mild disc bulge at C4-C5, with mild facet arthropathy and
uncovertebral hypertrophy, causes mild bilateral neural foraminal
narrowing.

MRI THORACIC SPINE FINDINGS

Alignment:  Physiologic.

Vertebrae: No fracture, evidence of discitis, or bone lesion. No
abnormal enhancement.

Cord:  Normal signal and morphology.  No abnormal enhancement.

Paraspinal and other soft tissues: Negative.

Disc levels:

No significant degenerative changes. No spinal canal stenosis or
neural foraminal narrowing.

MRI LUMBAR SPINE FINDINGS

Segmentation:  Standard.

Alignment:  No listhesis

Vertebrae: No fracture, evidence of discitis, or bone lesion. No
abnormal enhancement.

Conus medullaris and cauda equina: Conus extends to the L2-L3 level,
low lying. Conus and cauda equina appear normal. No abnormal
enhancement.

Paraspinal and other soft tissues: Negative.

Disc levels:

L5-S1 disc desiccation with mild disc bulge with superimposed
central/left paracentral protrusion, which narrows the left lateral
recess and may contact the descending left L1 nerve. No spinal canal
stenosis or neural foraminal narrowing in the lumbar spine.
IMPRESSION: 1. No abnormal enhancement in the spinal cord or vertebral bodies.
No evidence of metastatic disease in the spine.
2. Minimal degenerative changes, most prominent at L5-S1, where
there is a central/left paracentral protrusion, which may contact
the descending left L1 nerve.
3. C4-C5 mild bilateral neural foraminal narrowing.
4. No spinal canal stenosis or additional neural foraminal
narrowing.

## 2020-12-04 IMAGING — MR MR HEAD WO/W CM
14 of 16 series · 41 of 48 positions shown · IV contrast (gadavist)
Comparison: No prior MRI, correlation is made with CT head
[DATE].

CLINICAL DATA: Numbness or tingling, paresthesias, history of
vulvar cancer

EXAM:
MRI HEAD WITHOUT AND WITH CONTRAST
TECHNIQUE: Multiplanar, multiecho pulse sequences of the brain and surrounding
structures were obtained without and with intravenous contrast.
CONTRAST:  6mL GADAVIST GADOBUTROL 1 MMOL/ML IV SOLN

[Series 5: DWI · axial · 3.0mm · 0.88mm/px · z∈[-151,-16]mm · 7 of 100 slices shown (1 of 4)]
[im 1/100]
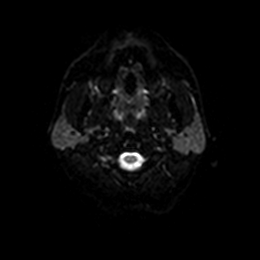
[im 17/100]
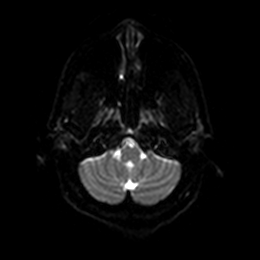
[im 34/100]
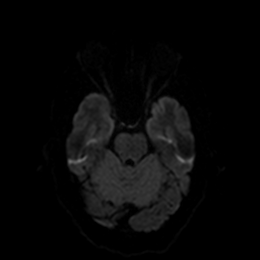
[im 50/100]
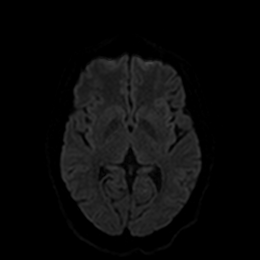
[im 67/100]
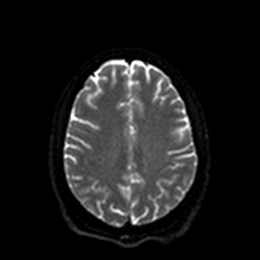
[im 83/100]
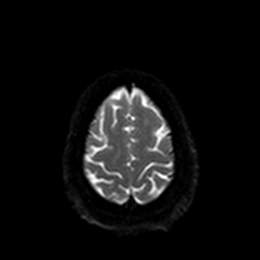
[im 100/100]
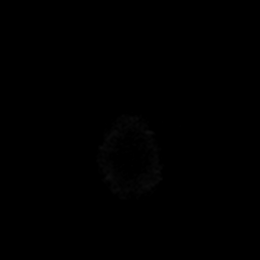

[Series 6: DWI · axial · 3.0mm · 0.88mm/px · z∈[-151,-16]mm · 3 of 50 slices shown (2 of 4)]
[im 1/50]
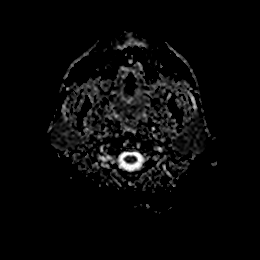
[im 25/50]
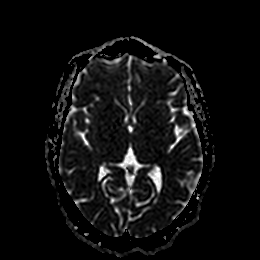
[im 50/50]
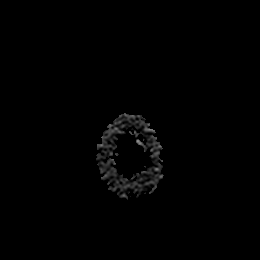

[Series 7: DWI · coronal · 4.0mm · 0.88mm/px · 5 of 68 slices shown (3 of 4)]
[im 1/68]
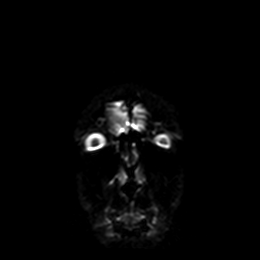
[im 17/68]
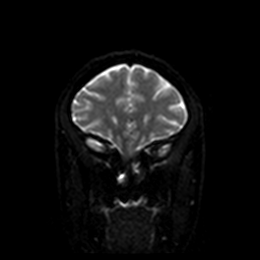
[im 34/68]
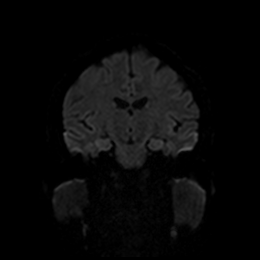
[im 51/68]
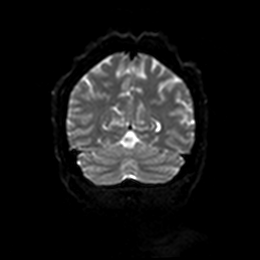
[im 68/68]
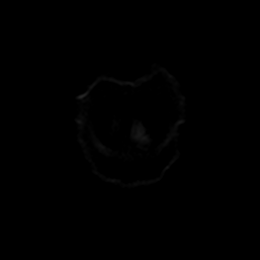

[Series 8: DWI · coronal · 4.0mm · 0.88mm/px · 2 of 34 slices shown (4 of 4)]
[im 1/34]
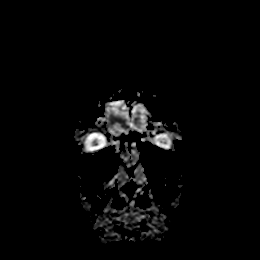
[im 34/34]
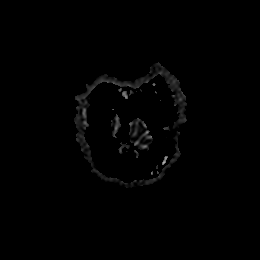

[Series 9: T1 · sagittal · 5.0mm · 0.75mm/px · 2 of 23 slices shown]
[im 1/23]
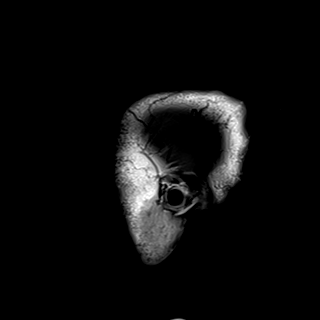
[im 23/23]
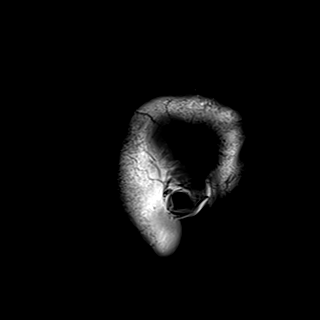

[Series 10: T2 · axial · 5.0mm · 0.72mm/px · z∈[-150,-17]mm · 2 of 25 slices shown]
[im 1/25]
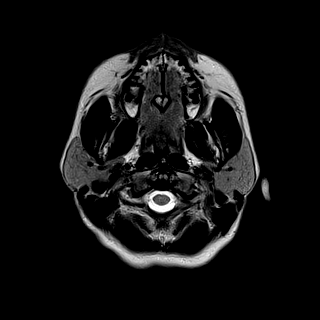
[im 25/25]
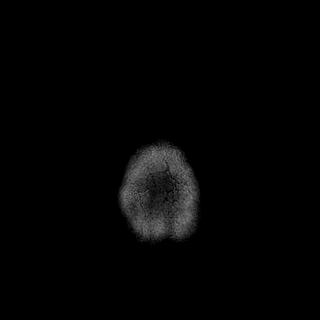

[Series 11: FLAIR · axial · 5.0mm · 0.45mm/px · z∈[-148,-15]mm · 2 of 25 slices shown]
[im 1/25]
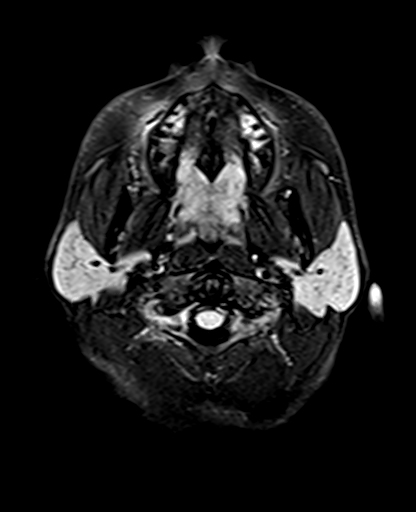
[im 25/25]
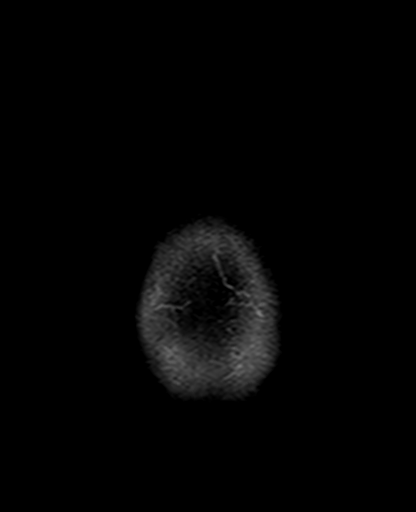

[Series 12: mag_images · axial · 3.0mm · 0.90mm/px · z∈[-152,-11]mm · 3 of 52 slices shown]
[im 1/52]
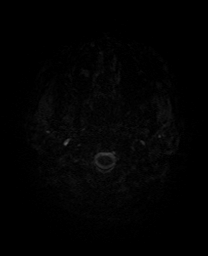
[im 26/52]
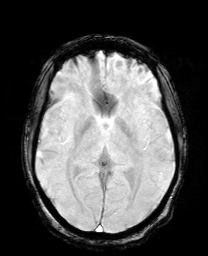
[im 52/52]
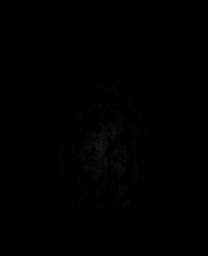

[Series 13: pha_images · axial · 3.0mm · 0.90mm/px · z∈[-152,-14]mm · 3 of 51 slices shown]
[im 1/51]
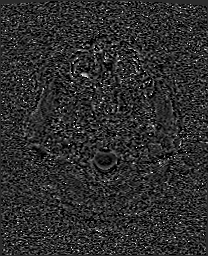
[im 26/51]
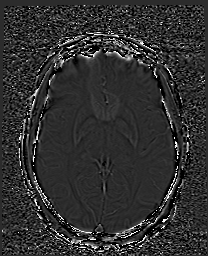
[im 51/51]
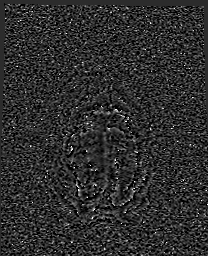

[Series 14: swi_images · axial · 3.0mm · 0.90mm/px · z∈[-152,-11]mm · 3 of 52 slices shown]
[im 1/52]
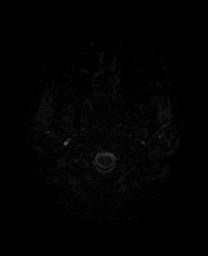
[im 26/52]
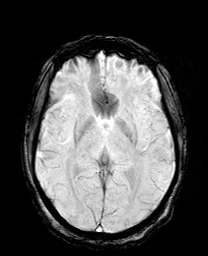
[im 52/52]
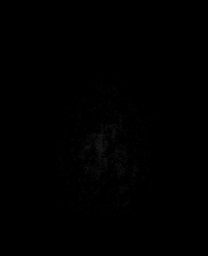

[Series 15: mip_images(sw) · axial · 24.0mm · 0.90mm/px · z∈[-143,-21]mm · 3 of 45 slices shown]
[im 1/45]
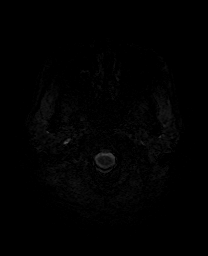
[im 23/45]
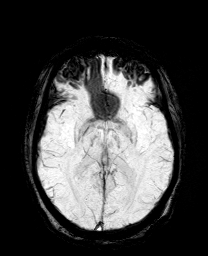
[im 45/45]
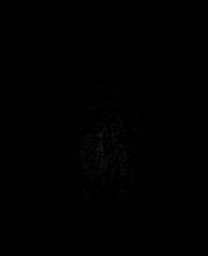

[Series 32: T2 post-contrast · coronal · 5.0mm · 0.72mm/px · 2 of 28 slices shown]
[im 1/28]
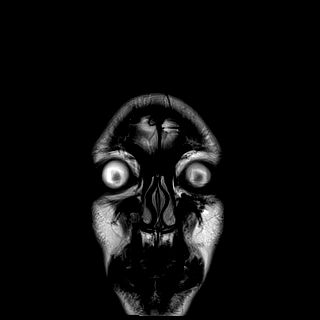
[im 28/28]
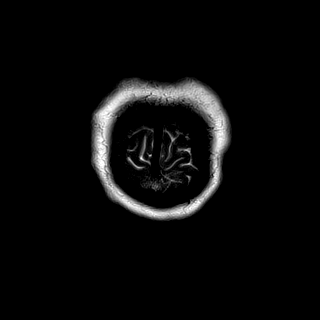

[Series 34: T1 post-contrast · coronal · 5.0mm · 0.34mm/px · 2 of 28 slices shown (1 of 2)]
[im 1/28]
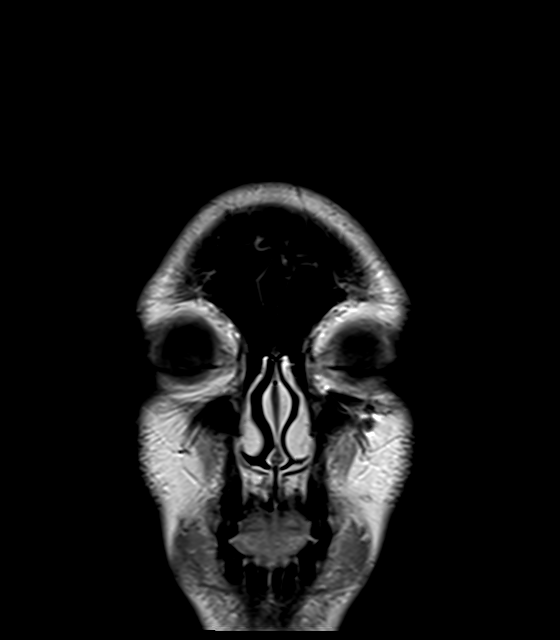
[im 28/28]
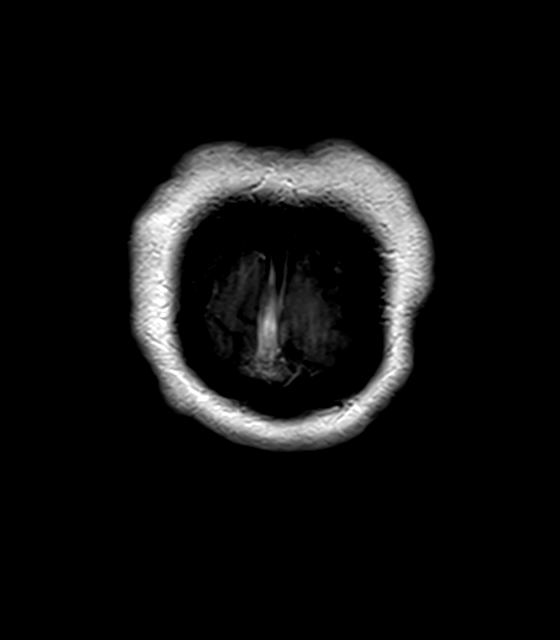

[Series 35: T1 post-contrast · sagittal · 5.0mm · 0.72mm/px · 2 of 23 slices shown (2 of 2)]
[im 1/23]
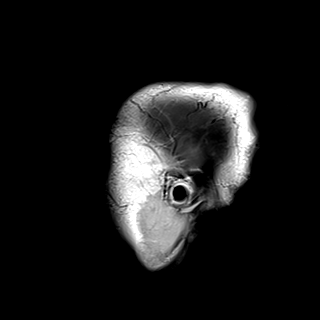
[im 23/23]
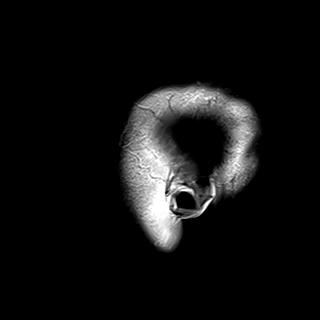

[41 of 48 positions shown; findings below may reference images not displayed]

FINDINGS: Brain: No acute infarction, hemorrhage, hydrocephalus, extra-axial
collection, or mass lesion. The ventricles and sulci are within
normal limits for age. No abnormal enhancement. No foci of increased
T2 signal.

Vascular: Normal flow voids.

Skull and upper cervical spine: Normal marrow signal.

Sinuses/Orbits: Mucous retention cyst in the right maxillary sinus.
The orbits are unremarkable.

Other: The mastoids are well aerated.
IMPRESSION: No acute intracranial process. No evidence of metastatic disease in
the brain.

## 2020-12-04 IMAGING — DX DG HIP (WITH OR WITHOUT PELVIS) 2-3V*L*
3 series · 3 of 3 positions shown · non-contrast
Comparison: CT pelvis [DATE]

CLINICAL DATA: Left leg weakness

EXAM:
DG HIP (WITH OR WITHOUT PELVIS) 2-3V LEFT

[pelvis ap]
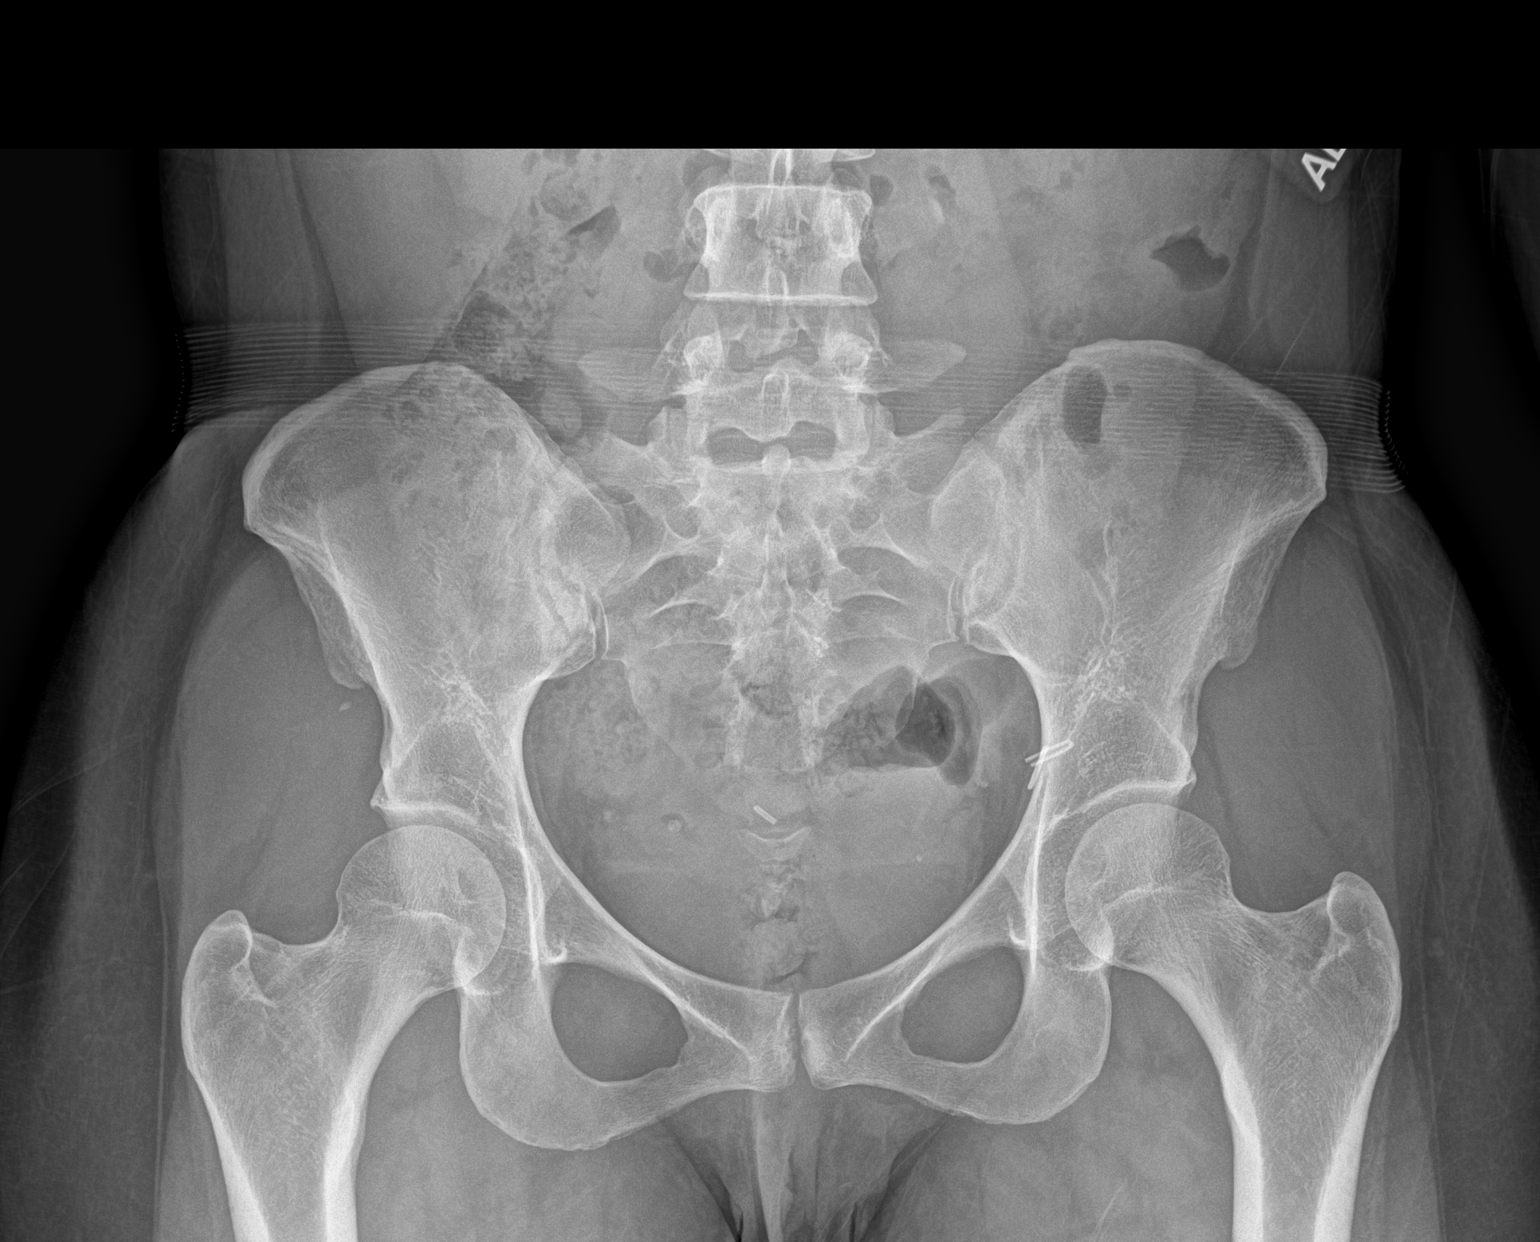

[hip ap]
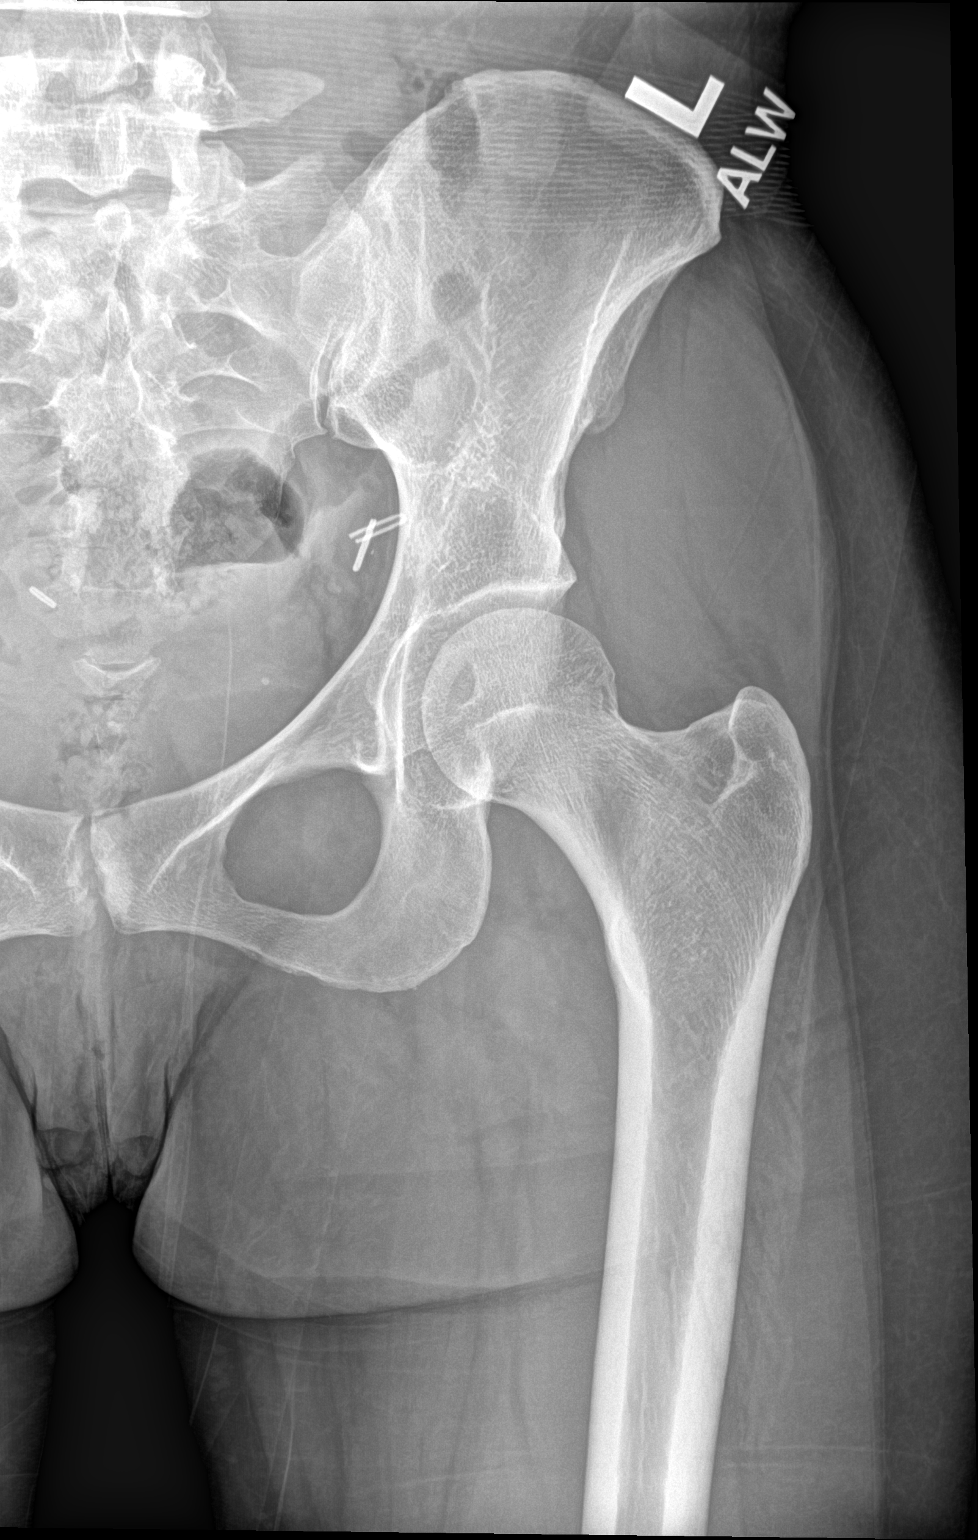

[hip lat]
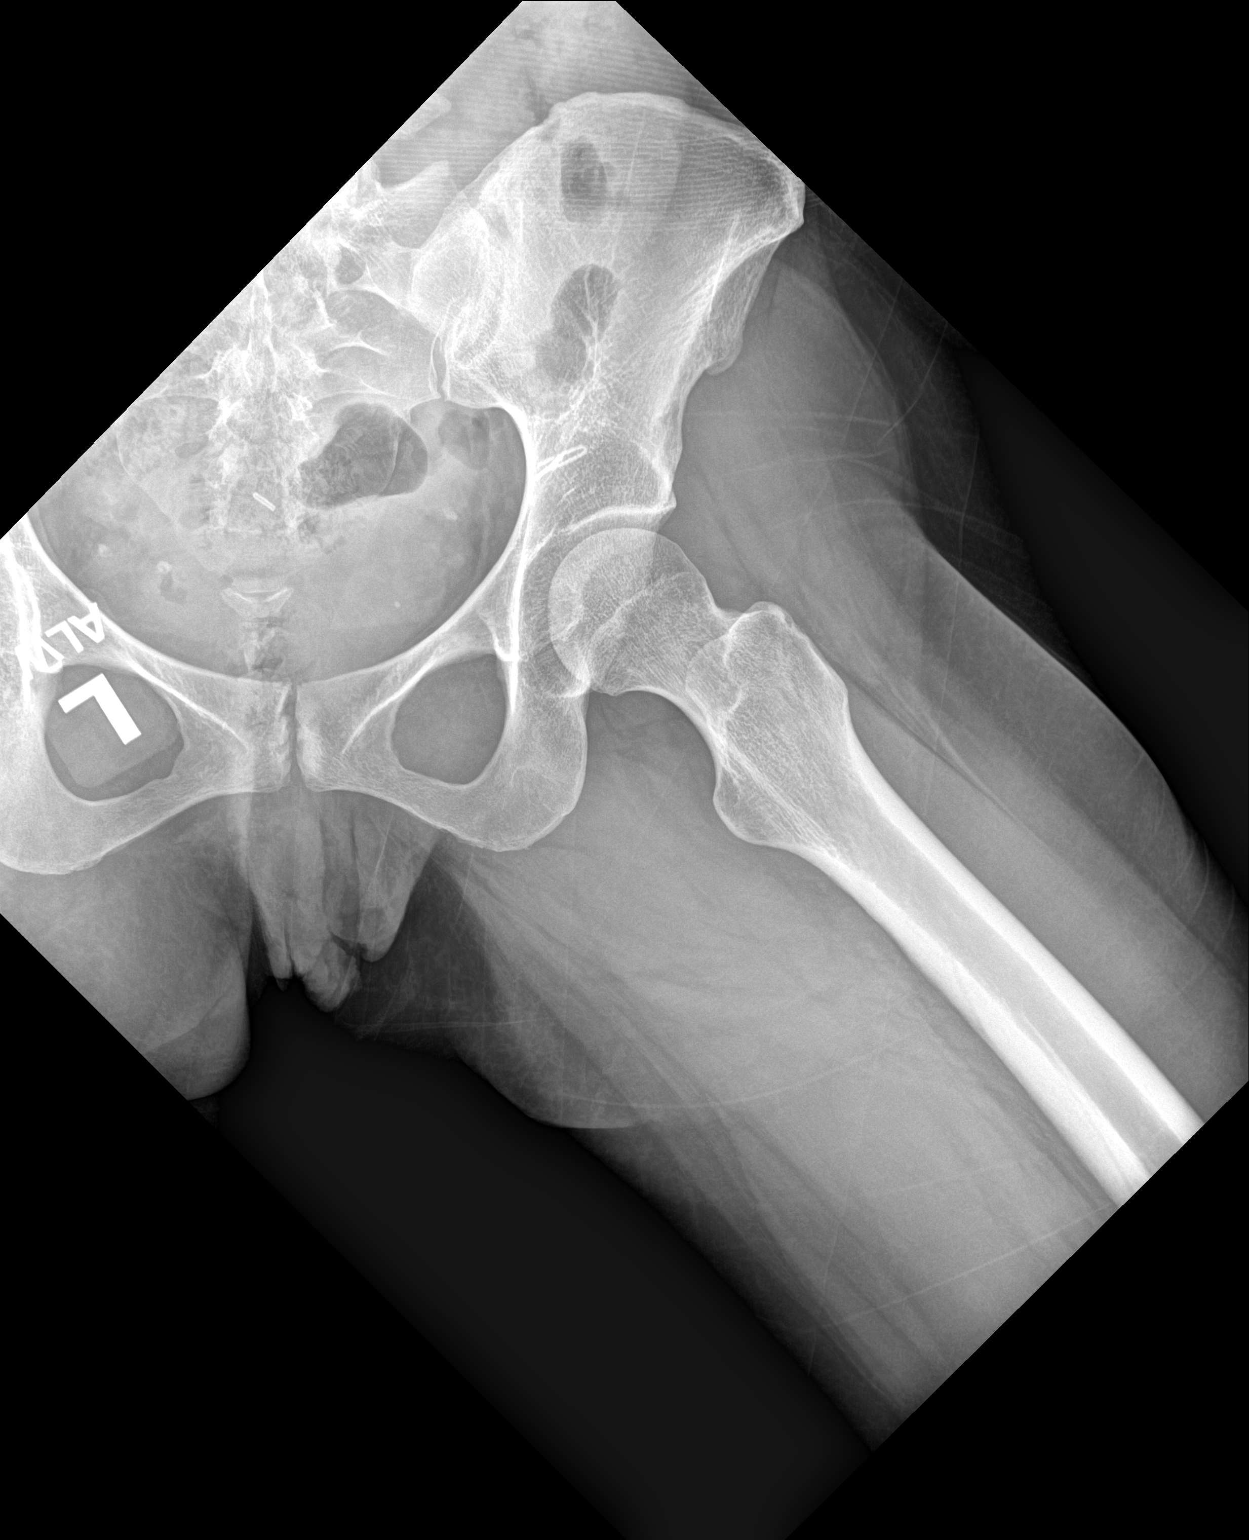

[3 of 3 positions shown; findings below may reference images not displayed]

FINDINGS: Stable appearance of small clips along the left inguinal region and
in the prevesical region. No fracture or acute bony findings.
Visualized bowel gas pattern unremarkable.
IMPRESSION: Negative.

## 2020-12-04 MED ORDER — PREDNISONE 10 MG PO TABS: 30.0000 mg | ORAL_TABLET | Freq: Every day | ORAL | 0 refills | Status: AC

## 2020-12-04 MED ORDER — GADOBUTROL 1 MMOL/ML IV SOLN
6.0000 mL | Freq: Once | INTRAVENOUS | Status: AC | PRN
  Administered 2020-12-04: 6 mL via INTRAVENOUS

## 2020-12-04 MED ORDER — OXYCODONE-ACETAMINOPHEN 5-325 MG PO TABS: 1.0000 | ORAL_TABLET | Freq: Three times a day (TID) | ORAL | 0 refills | Status: AC | PRN

## 2020-12-04 MED ORDER — CYCLOBENZAPRINE HCL 10 MG PO TABS: 10.0000 mg | ORAL_TABLET | Freq: Two times a day (BID) | ORAL | 0 refills | Status: AC | PRN

## 2020-12-04 NOTE — Discharge Instructions (Signed)
Imaging shows some degenerative changes at C4-C5 as well as L5-S1.  I have given you prescription for steroids please take as prescribed.I have given you a short course of narcotics please take as prescribed.  This medication can make you drowsy do not consume alcohol or operate heavy machinery when taking this medication.  This medication is Tylenol in it do not take Tylenol and take this medication.  I have also given you a prescription for a muscle relaxer this can make you drowsy do not consume alcohol or operate heavy machinery when taking this medication.  I also recommend applying warm compresses to the area and stretch out these muscles and this will help decrease inflammation and swelling.  Please follow-up with neurosurgery for further evaluation.  Come back to the emergency department if you develop chest pain, shortness of breath, severe abdominal pain, uncontrolled nausea, vomiting, diarrhea.

## 2020-12-04 NOTE — ED Notes (Signed)
Patient transported to X-ray 

## 2020-12-04 NOTE — ED Notes (Signed)
Pt reports having intermittent numbness, tingling and back/hip pain to Left side since giving birth to her daughter 3.5 years ago. Her daughter will be 4 in December. Pt states its just getting progressively worse. Pt states she had an epidural tube in her back for approx 7 hours for her scheduled  C-section with her daughter. States "I died during the surgery, they brought me back to life" Pt reports her symptoms seem to get progressively worse. Pt drove herself home last night after numbness started in her hip, her son helped her out of the car last night and again today so pt could drive herself to the ED. Pt reports her spouse will be picking her up. Pt able to lift both arms w/no drift, c/o pain to collar bone. Able to lift her Left leg off of bed but unable to hold it up. Pt able to lift her Right leg with no difficulty.

## 2020-12-04 NOTE — ED Provider Notes (Signed)
I personally evaluated the patient during the encounter and completed a history, physical, procedures, medical decision making to contribute to the overall care of the patient and decision making for the patient briefly, the patient is a 35 y.o. female with left-sided weakness/left-sided pain.  Patient presents with multiple complaints.  Sounds like she has had some new numbness and weakness in the left lower extremity that started yesterday afternoon.  Has pain in the left hip joint but does not think that it is the pain that is causing the weakness.  She is also been having ongoing pain, weakness and numbness in her left upper extremity for the last several months.  That is also worse.  She has been having some visual symptoms in the left eye for the last several months as well.  There is a history of MS in the family.  She has a very patchy neurological exam.  Could be a functional exam as well.  She has weakness in the left lower and left upper extremity.  Does not appear to be secondary to pain however but she has patchy numbness throughout the left upper, left lower extremity.  Visual fields appear to be intact as well as acuity.  Overall this could be mostly muscular or functional but concern for possibly something such as MS.  Seems less likely to be cauda equina.  Could be stenotic disease.  Overall talked with neurology in the phone and shared decision was made to get MRIs with and without of her head and spine to further rule out MS and transverse myelitis and other neuromuscular processes.  Have lower suspicion for stroke.  CT head was normal.  X-rays of the shoulder and hip unremarkable.  Lab work thus far unremarkable.  Will transfer to [facility] for MRI.  Disposition for MRIs.  Neurology to be consulted if MRIs are consistent with a neurological process.  This chart was dictated using voice recognition software.  Despite best efforts to proofread,  errors can occur which can change the  documentation meaning.    EKG Interpretation None            Lennice Sites, DO 12/04/20 1401
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
## 2020-12-04 NOTE — ED Notes (Signed)
Patient transported to CT 

## 2020-12-04 NOTE — ED Notes (Signed)
Report given to Sarah charge RN and Konrad Dolores [facility] RN

## 2020-12-04 NOTE — ED Triage Notes (Signed)
About 2 pm yesterday sitting at desk then could not stand on left leg, continued symptoms with a feeling of needles in fingers and toes, left hip feeling like joint is hurting, states son assisted her to car today but drove herself to hospital. She was able to get in hospital by self from parking lot. Weakness noted in left extremities.
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
## 2020-12-04 NOTE — ED Notes (Signed)
EDPA Provider at bedside. 

## 2021-02-25 ENCOUNTER — Other Ambulatory Visit: Payer: Self-pay

## 2021-02-25 ENCOUNTER — Other Ambulatory Visit (HOSPITAL_COMMUNITY)
Admission: RE | Admit: 2021-02-25 | Discharge: 2021-02-25 | Disposition: A | Discharge status: Home / Self Care | Payer: Medicaid Other | Source: Ambulatory Visit | Attending: Obstetrics and Gynecology | Admitting: Obstetrics and Gynecology

## 2021-02-25 ENCOUNTER — Ambulatory Visit (INDEPENDENT_AMBULATORY_CARE_PROVIDER_SITE_OTHER): Payer: Medicaid Other | Admitting: *Deleted

## 2021-02-25 VITALS — BP 133/85 | HR 85

## 2021-02-25 DIAGNOSIS — Z202 Contact with and (suspected) exposure to infections with a predominantly sexual mode of transmission: Secondary | ICD-10-CM

## 2021-02-25 DIAGNOSIS — Z113 Encounter for screening for infections with a predominantly sexual mode of transmission: Secondary | ICD-10-CM | POA: Diagnosis not present

## 2021-02-25 MED ORDER — METRONIDAZOLE 500 MG PO TABS: 500.0000 mg | ORAL_TABLET | Freq: Two times a day (BID) | ORAL | 0 refills | Status: AC

## 2021-02-25 NOTE — Progress Notes (Signed)
SUBJECTIVE:  36 y.o. female complains of scant vaginal discharge for 1 week(s). Husband tested positive for trichomonas. Patient tested positive in July but did not do treatment.  Denies abnormal vaginal bleeding or significant pelvic pain or fever. No UTI symptoms. Denies history of known exposure to STD.  Patient's last menstrual period was 04/17/2016 (lmp unknown).  OBJECTIVE:  She appears well, afebrile. Urine dipstick: not done.  ASSESSMENT:  Vaginal Discharge  Vaginal Odor   PLAN:  GC, chlamydia, trichomonas, BVAG, CVAG probe sent to lab. Treatment: To be determined once lab results are received ROV prn if symptoms persist or worsen.   RX sent Flagyl for known exposure to trichomonas.

## 2021-02-27 LAB — CERVICOVAGINAL ANCILLARY ONLY
Bacterial Vaginitis (gardnerella): POSITIVE — AB
Candida Glabrata: NEGATIVE
Candida Vaginitis: NEGATIVE
Chlamydia: NEGATIVE
Comment: NEGATIVE
Comment: NEGATIVE
Comment: NEGATIVE
Comment: NEGATIVE
Comment: NEGATIVE
Comment: NORMAL
Neisseria Gonorrhea: NEGATIVE
Trichomonas: POSITIVE — AB

## 2021-03-01 ENCOUNTER — Telehealth: Payer: Self-pay

## 2021-03-01 NOTE — Telephone Encounter (Signed)
-----   Message from Mora Bellman, MD sent at 02/28/2021 11:43 AM EST ----- Please inform patient of BV and trichomonas infection. Rx has been e-prescribed. Trichomonas is considered an STD. Her partner needs to be informed and treated. They both need to abstain for 7 days following treatment  Thanks  Peggy

## 2021-03-01 NOTE — Telephone Encounter (Signed)
Pt advised of results and verbalizes understanding at this time.

## 2021-03-18 ENCOUNTER — Encounter: Payer: Self-pay | Admitting: Obstetrics and Gynecology

## 2021-03-18 ENCOUNTER — Ambulatory Visit: Payer: Medicaid Other | Admitting: Obstetrics and Gynecology

## 2021-03-18 ENCOUNTER — Other Ambulatory Visit: Payer: Self-pay

## 2021-03-18 VITALS — BP 124/82 | HR 96 | Ht 63.0 in | Wt 134.0 lb

## 2021-03-18 DIAGNOSIS — N951 Menopausal and female climacteric states: Secondary | ICD-10-CM

## 2021-03-18 DIAGNOSIS — L680 Hirsutism: Secondary | ICD-10-CM

## 2021-03-18 MED ORDER — SPIRONOLACTONE 50 MG PO TABS: 50.0000 mg | ORAL_TABLET | Freq: Two times a day (BID) | ORAL | 2 refills | Status: DC

## 2021-03-18 MED ORDER — METRONIDAZOLE 500 MG PO TABS: 500.0000 mg | ORAL_TABLET | Freq: Two times a day (BID) | ORAL | 0 refills | Status: AC

## 2021-03-18 NOTE — Progress Notes (Signed)
GYN presents for hormonal problems, c/o hot flashes, night sweats, soaking clothes and bed sheets, headaches, morning sickness, using men's roll-on  x 4 months
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
## 2021-04-02 ENCOUNTER — Other Ambulatory Visit: Payer: Self-pay | Admitting: Obstetrics and Gynecology

## 2021-04-02 MED ORDER — SPIRONOLACTONE 50 MG PO TABS: 50.0000 mg | ORAL_TABLET | Freq: Two times a day (BID) | ORAL | 2 refills | Status: AC

## 2021-11-12 ENCOUNTER — Emergency Department (HOSPITAL_COMMUNITY): Payer: Self-pay

## 2021-11-12 ENCOUNTER — Encounter (HOSPITAL_COMMUNITY): Payer: Self-pay

## 2021-11-12 ENCOUNTER — Emergency Department (HOSPITAL_COMMUNITY)
Admission: EM | Admit: 2021-11-12 | Discharge: 2021-11-12 | Disposition: A | Discharge status: Home / Self Care | Payer: Self-pay | Attending: Emergency Medicine | Admitting: Emergency Medicine

## 2021-11-12 DIAGNOSIS — K644 Residual hemorrhoidal skin tags: Secondary | ICD-10-CM

## 2021-11-12 DIAGNOSIS — K648 Other hemorrhoids: Secondary | ICD-10-CM | POA: Insufficient documentation

## 2021-11-12 LAB — URINALYSIS, ROUTINE W REFLEX MICROSCOPIC
Bilirubin Urine: NEGATIVE
Glucose, UA: NEGATIVE mg/dL
Ketones, ur: NEGATIVE mg/dL
Nitrite: NEGATIVE
Protein, ur: NEGATIVE mg/dL
Specific Gravity, Urine: 1.021 (ref 1.005–1.030)
pH: 5 (ref 5.0–8.0)

## 2021-11-12 LAB — COMPREHENSIVE METABOLIC PANEL
ALT: 11 U/L (ref 0–44)
AST: 15 U/L (ref 15–41)
Albumin: 3.9 g/dL (ref 3.5–5.0)
Alkaline Phosphatase: 37 U/L — ABNORMAL LOW (ref 38–126)
Anion gap: 5 (ref 5–15)
BUN: 11 mg/dL (ref 6–20)
CO2: 28 mmol/L (ref 22–32)
Calcium: 9.1 mg/dL (ref 8.9–10.3)
Chloride: 104 mmol/L (ref 98–111)
Creatinine, Ser: 0.7 mg/dL (ref 0.44–1.00)
GFR, Estimated: >60 mL/min (ref >60)
Glucose, Bld: 72 mg/dL (ref 70–99)
Potassium: 3.7 mmol/L (ref 3.5–5.1)
Sodium: 137 mmol/L (ref 135–145)
Total Bilirubin: 0.6 mg/dL (ref 0.3–1.2)
Total Protein: 7.1 g/dL (ref 6.5–8.1)

## 2021-11-12 LAB — CBC WITH DIFFERENTIAL/PLATELET
Abs Immature Granulocytes: 0.01 10*3/uL (ref 0.00–0.07)
Basophils Absolute: 0 10*3/uL (ref 0.0–0.1)
Basophils Relative: 0 %
Eosinophils Absolute: 0 10*3/uL (ref 0.0–0.5)
Eosinophils Relative: 0 %
HCT: 39.2 % (ref 36.0–46.0)
Hemoglobin: 13.4 g/dL (ref 12.0–15.0)
Immature Granulocytes: 0 %
Lymphocytes Relative: 34 %
Lymphs Abs: 1.6 10*3/uL (ref 0.7–4.0)
MCH: 32.8 pg (ref 26.0–34.0)
MCHC: 34.2 g/dL (ref 30.0–36.0)
MCV: 95.8 fL (ref 80.0–100.0)
Monocytes Absolute: 0.3 10*3/uL (ref 0.1–1.0)
Monocytes Relative: 6 %
Neutro Abs: 2.7 10*3/uL (ref 1.7–7.7)
Neutrophils Relative %: 60 %
Platelets: 236 10*3/uL (ref 150–400)
RBC: 4.09 MIL/uL (ref 3.87–5.11)
RDW: 13.3 % (ref 11.5–15.5)
WBC: 4.6 10*3/uL (ref 4.0–10.5)
nRBC: 0 % (ref 0.0–0.2)

## 2021-11-12 LAB — PREGNANCY, URINE: Preg Test, Ur: NEGATIVE

## 2021-11-12 MED ORDER — LIDOCAINE VISCOUS HCL 2 % MT SOLN: 15.0000 mL | OROMUCOSAL | 0 refills | Status: AC | PRN

## 2021-11-12 MED ORDER — HYDROCORTISONE (PERIANAL) 2.5 % EX CREA: 1.0000 | TOPICAL_CREAM | Freq: Two times a day (BID) | CUTANEOUS | 0 refills | Status: AC

## 2021-11-12 MED ORDER — IOHEXOL 300 MG/ML  SOLN
100.0000 mL | Freq: Once | INTRAMUSCULAR | Status: AC | PRN
  Administered 2021-11-12: 100 mL via INTRAVENOUS

## 2021-11-12 NOTE — ED Notes (Signed)
Needs IV for CT  

## 2021-11-12 NOTE — ED Triage Notes (Signed)
Pt states that she has been having rectal bleeding secondary to an external hemorrhoid since yesterday. Pt states she also has pain in her R hip/leg.

## 2021-11-12 NOTE — Discharge Instructions (Addendum)
Follow-up outpatient for hemorrhoids.  This can be done a primary care office.  He would like surgical removal of your external hemorrhoids General surgery would be the next evaluation.  If you are concerned of any internal hemorrhoids that should be the gastroenterologist I have given information to call to schedule an appointment in your discharge instructions  Use the doughnut to sit on to help with your pain  May also recommend warm sitz bath 3-4 times daily.  Return for new or worsening symptoms.

## 2021-11-12 NOTE — ED Provider Triage Note (Signed)
Emergency Medicine Provider Triage Evaluation Note  Regina Villegas , a 36 y.o. female  was evaluated in triage.  Pt complains of possible hemorrhoids.  Patient admits to rectal bleeding when wiping and streaks of blood in stool.  Patient notes has been an ongoing issue.  She also states she has been having pain with walking.  No fever or chills.  Review of Systems  Positive: Rectal bleeding Negative: fever  Physical Exam  BP 120/82 (BP Location: Right Arm)   Pulse 72   Temp 98.3 F (36.8 C) (Oral)   Resp 18   Ht '5\' 3"'$  (1.6 m)   Wt 59.9 kg   LMP 04/17/2016 (LMP Unknown)   SpO2 98%   BMI 23.38 kg/m  Gen:   Awake, no distress   Resp:  Normal effort  MSK:   Moves extremities without difficulty  Other:    Medical Decision Making  Medically screening exam initiated at 11:30 AM.  Appropriate orders placed.  [name] was informed that the remainder of the evaluation will be completed by another provider, this initial triage assessment does not replace that evaluation, and the importance of remaining in the ED until their evaluation is complete.  Routine labs   Regina Villegas 11/12/21 1131

## 2021-11-12 NOTE — ED Provider Notes (Signed)
[facility] DEPT Provider Note   CSN: [id number] Arrival date & time: 11/12/21  1050    History  Chief Complaint  Patient presents with   Hemorrhoids    Regina Villegas is a 36 y.o. female here for rectal pain x 1 week, intermittent x 1 month.  History of hemorrhoids.  Has noted she has had some blood on the toilet paper.  She feels like she has some fullness to her right perirectal region.  Does have history of perirectal abscess.  Also noted some dysuria without hematuria.  History of UTI.  No nausea, vomiting, fever.  No abdominal pain.  Does have history of trichomonas. Pain worse when straining for a BM      HPI     Home Medications Prior to Admission medications   Medication Sig Start Date End Date Taking? Authorizing Provider  hydrocortisone (ANUSOL-HC) 2.5 % rectal cream Place 1 Application rectally 2 (two) times daily. 11/12/21  Yes Terea Neubauer A, PA-C  lidocaine (XYLOCAINE) 2 % solution Use as directed 15 mLs in the mouth or throat as needed for mouth pain. 11/12/21  Yes Trang Bouse A, PA-C  cyclobenzaprine (FLEXERIL) 10 MG tablet Take 1 tablet (10 mg total) by mouth 2 (two) times daily as needed for muscle spasms. 12/04/20   Marcello Fennel, PA-C  metroNIDAZOLE (FLAGYL) 500 MG tablet Take 1 tablet (500 mg total) by mouth 2 (two) times daily. 02/25/21   Constant, Peggy, MD  metroNIDAZOLE (FLAGYL) 500 MG tablet Take 1 tablet (500 mg total) by mouth 2 (two) times daily. 03/18/21   Constant, Peggy, MD  predniSONE (DELTASONE) 10 MG tablet Take 3 tablets (30 mg total) by mouth daily. 12/04/20   Marcello Fennel, PA-C  spironolactone (ALDACTONE) 50 MG tablet Take 1 tablet (50 mg total) by mouth 2 (two) times daily. Will start with 50 mg twice daily, and increase to 100 mg twice daily as needed 04/02/21   Constant, Peggy, MD      Allergies    Tramadol    Review of Systems   Review of Systems  Constitutional: Negative.   HENT:  Negative.    Respiratory: Negative.    Cardiovascular: Negative.   Gastrointestinal:  Positive for anal bleeding and rectal pain. Negative for abdominal distention, abdominal pain, blood in stool, constipation, diarrhea, nausea and vomiting.  Genitourinary: Negative.   Musculoskeletal: Negative.   Neurological: Negative.   All other systems reviewed and are negative.   Physical Exam Updated Vital Signs BP (!) 127/94 (BP Location: Left Arm)   Pulse 68   Temp 98.3 F (36.8 C) (Oral)   Resp 14   Ht '5\' 3"'$  (1.6 m)   Wt 59.9 kg   LMP 04/17/2016 (LMP Unknown)   SpO2 100%   BMI 23.38 kg/m  Physical Exam Vitals and nursing note reviewed. Exam conducted with a chaperone present.  Constitutional:      General: She is not in acute distress.    Appearance: She is well-developed. She is not ill-appearing, toxic-appearing or diaphoretic.  HENT:     Head: Normocephalic and atraumatic.     Nose: Nose normal.     Mouth/Throat:     Mouth: Mucous membranes are moist.  Eyes:     Pupils: Pupils are equal, round, and reactive to light.  Cardiovascular:     Rate and Rhythm: Normal rate.     Pulses: Normal pulses.     Heart sounds: Normal heart sounds.  Pulmonary:  Effort: Pulmonary effort is normal. No respiratory distress.     Breath sounds: Normal breath sounds.  Abdominal:     General: Bowel sounds are normal. There is no distension.     Palpations: Abdomen is soft.  Genitourinary:    Rectum: Tenderness and external hemorrhoid present.     Comments: Chaperone present for exam.  Small external thrombosed hemorrhoid at the 12 o'clock position.  Mild fullness to the right perirectal region without gross erythema, fluctuance or induration. Some overlying tenderness Musculoskeletal:        General: Normal range of motion.     Cervical back: Normal range of motion.  Skin:    General: Skin is warm and dry.  Neurological:     General: No focal deficit present.     Mental Status: She is  alert.  Psychiatric:        Mood and Affect: Mood normal.     ED Results / Procedures / Treatments   Labs (all labs ordered are listed, but only abnormal results are displayed) Labs Reviewed  COMPREHENSIVE METABOLIC PANEL - Abnormal; Notable for the following components:      Result Value   Alkaline Phosphatase 37 (*)    All other components within normal limits  URINALYSIS, ROUTINE W REFLEX MICROSCOPIC - Abnormal; Notable for the following components:   APPearance CLOUDY (*)    Hgb urine dipstick SMALL (*)    Leukocytes,Ua SMALL (*)    Bacteria, UA RARE (*)    All other components within normal limits  CBC WITH DIFFERENTIAL/PLATELET  PREGNANCY, URINE  POC URINE PREG, ED    EKG None  Radiology CT ABDOMEN PELVIS W CONTRAST  Result Date: 11/12/2021 CLINICAL DATA:  Rectal pain.  Evaluate for abscess. EXAM: CT ABDOMEN AND PELVIS WITH CONTRAST TECHNIQUE: Multidetector CT imaging of the abdomen and pelvis was performed using the standard protocol following bolus administration of intravenous contrast. RADIATION DOSE REDUCTION: This exam was performed according to the departmental dose-optimization program which includes automated exposure control, adjustment of the mA and/or kV according to patient size and/or use of iterative reconstruction technique. CONTRAST:  167m OMNIPAQUE IOHEXOL 300 MG/ML  SOLN COMPARISON:  CT pelvis 04/18/2020 FINDINGS: Lower chest: No acute abnormality. Hepatobiliary: Subcentimeter hypodensity in the right liver is too small to characterize, likely cyst or hemangioma. Otherwise, the liver, gallbladder and bile ducts are within normal limits. Pancreas: Unremarkable. No pancreatic ductal dilatation or surrounding inflammatory changes. Spleen: Normal in size without focal abnormality. Adrenals/Urinary Tract: Adrenal glands are unremarkable. Kidneys are normal, without renal calculi, focal lesion, or hydronephrosis. Bladder is unremarkable. Stomach/Bowel: Stomach is  within normal limits. Appendix appears normal. No evidence of bowel wall thickening, distention, or inflammatory changes. Vascular/Lymphatic: No significant vascular findings are present. No enlarged abdominal or pelvic lymph nodes. Reproductive: Uterus is surgically absent. There is a 3.0 cm cyst in the right ovary. Left ovary unremarkable. Other: Trace free fluid in the pelvis. No focal abscess. No focal abdominal wall hernia. There surgical clips in the left pelvis. Musculoskeletal: No acute or significant osseous findings. IMPRESSION: 1. No evidence for perirectal or intraabdominal abscess. 2. Trace free fluid in the pelvis. 3. 3 cm right ovarian simple-appearing cyst. No follow-up imaging is recommended. Reference: JACR 2020 Feb;17(2):248-254 Electronically Signed   By: ARonney AstersM.D.   On: 11/12/2021 18:12    Procedures Procedures    Medications Ordered in ED Medications  iohexol (OMNIPAQUE) 300 MG/ML solution 100 mL (100 mLs Intravenous Contrast Given 11/12/21 1744)  ED Course/ Medical Decision Making/ A&P    36 year old history of prior perirectal abscess, inguinal abscess as well as hemorrhoids here for evaluation of rectal pain intermittent over the last month however persistent over the last few days.  Pain with bowel movements.  Has noted some blood on the toilet paper.  She does have an obvious thrombosed hemorrhoid at the 12 o'clock position however has some tenderness and fullness at the right perirectal region.  No obvious fluctuance or induration to suggest large, drainable abscess.  Also having some urinary symptoms.  We will plan on labs, imaging and reassess  Labs and imaging personally viewed and interpreted:  Preg neg UA neg for infection CBC without leukocytosis Metabolic panel without significant abnormality CT AP without acute abnormality  Patient reassessed. Discussed labs and imaging.  Her rectal pain is likely from her hemorrhoids, she has no evidence of  fistula or fissure.  Unclear why patient has some tenderness to her right perirectal region as she has no evidence of cellulitis on exam, no abscess on imaging.  Low suspicion for Fournier's gangrene, necrotizing fasciitis.  D/C home with symptomatic management for her hemorrhoids.  Plan referral to follow-up outpatient for her symptoms.  The patient has been appropriately medically screened and/or stabilized in the ED. I have low suspicion for any other emergent medical condition which would require further screening, evaluation or treatment in the ED or require inpatient management.  Patient is hemodynamically stable and in no acute distress.  Patient able to ambulate in department prior to ED.  Evaluation does not show acute pathology that would require ongoing or additional emergent interventions while in the emergency department or further inpatient treatment.  I have discussed the diagnosis with the patient and answered all questions.  Pain is been managed while in the emergency department and patient has no further complaints prior to discharge.  Patient is comfortable with plan discussed in room and is stable for discharge at this time.  I have discussed strict return precautions for returning to the emergency department.  Patient was encouraged to follow-up with PCP/specialist refer to at discharge.                           Medical Decision Making Amount and/or Complexity of Data Reviewed External Data Reviewed: labs, radiology and notes. Labs: ordered. Decision-making details documented in ED Course. Radiology: ordered and independent interpretation performed. Decision-making details documented in ED Course.  Risk OTC drugs. Prescription drug management. Parenteral controlled substances. Decision regarding hospitalization. Diagnosis or treatment significantly limited by social determinants of health.          Final Clinical Impression(s) / ED Diagnoses Final diagnoses:   External hemorrhoids    Rx / DC Orders ED Discharge Orders          Ordered    hydrocortisone (ANUSOL-HC) 2.5 % rectal cream  2 times daily        11/12/21 1822    lidocaine (XYLOCAINE) 2 % solution  As needed        11/12/21 1822              Eamonn Sermeno A, PA-C 11/12/21 1826    Ezequiel Essex, MD 11/12/21 1904

## 2022-02-08 ENCOUNTER — Encounter (HOSPITAL_BASED_OUTPATIENT_CLINIC_OR_DEPARTMENT_OTHER): Payer: Self-pay | Admitting: Emergency Medicine

## 2022-02-08 ENCOUNTER — Emergency Department (HOSPITAL_BASED_OUTPATIENT_CLINIC_OR_DEPARTMENT_OTHER)
Admission: EM | Admit: 2022-02-08 | Discharge: 2022-02-08 | Disposition: A | Discharge status: Home / Self Care | Payer: Commercial Managed Care - HMO | Attending: Emergency Medicine | Admitting: Emergency Medicine

## 2022-02-08 DIAGNOSIS — H6692 Otitis media, unspecified, left ear: Secondary | ICD-10-CM | POA: Insufficient documentation

## 2022-02-08 DIAGNOSIS — J101 Influenza due to other identified influenza virus with other respiratory manifestations: Secondary | ICD-10-CM | POA: Insufficient documentation

## 2022-02-08 DIAGNOSIS — H6690 Otitis media, unspecified, unspecified ear: Secondary | ICD-10-CM

## 2022-02-08 DIAGNOSIS — Z1152 Encounter for screening for COVID-19: Secondary | ICD-10-CM | POA: Diagnosis not present

## 2022-02-08 DIAGNOSIS — R0981 Nasal congestion: Secondary | ICD-10-CM | POA: Diagnosis present

## 2022-02-08 HISTORY — DX: Unspecified convulsions: R56.9

## 2022-02-08 LAB — RESP PANEL BY RT-PCR (RSV, FLU A&B, COVID)  RVPGX2
Influenza A by PCR: POSITIVE — AB
Influenza B by PCR: NEGATIVE
Resp Syncytial Virus by PCR: NEGATIVE
SARS Coronavirus 2 by RT PCR: NEGATIVE

## 2022-02-08 MED ORDER — AMOXICILLIN-POT CLAVULANATE 875-125 MG PO TABS: 1.0000 | ORAL_TABLET | Freq: Two times a day (BID) | ORAL | 0 refills | Status: AC

## 2022-02-08 MED ORDER — FLUTICASONE PROPIONATE 50 MCG/ACT NA SUSP: 2.0000 | Freq: Every day | NASAL | 2 refills | Status: AC

## 2022-02-08 MED ORDER — AMOXICILLIN-POT CLAVULANATE 875-125 MG PO TABS
1.0000 | ORAL_TABLET | Freq: Once | ORAL | Status: AC
  Administered 2022-02-08: 1 via ORAL
  Filled 2022-02-08: qty 1

## 2022-02-08 MED ORDER — ACETAMINOPHEN 325 MG PO TABS
650.0000 mg | ORAL_TABLET | Freq: Once | ORAL | Status: AC | PRN
  Administered 2022-02-08: 650 mg via ORAL
  Filled 2022-02-08: qty 2

## 2022-02-08 NOTE — ED Provider Notes (Signed)
[facility] EMERGENCY DEPT Provider Note   CSN: [id number] Arrival date & time: 02/08/22  0944     History  Chief Complaint  Patient presents with   Nasal Congestion   Fever    Regina Villegas is a 36 y.o. female.  With PMH of seizures who presents with cough, congestion, fevers and ear pain over the past 4 to 5 days.  This is in the setting of her husband who recently tested flu positive.  She has been taking Tylenol with improvement of symptoms.  However she is concerned because she has been having some pain and crackling behind her ear is mainly on the left.  She has had history of tympanostomy tubes and ear infections.  She has had subjective fevers at home but no measured fevers.  She has had a cough that is nonproductive.  Also congestion and clear rhinorrhea.  No vomiting, no diarrhea, no abdominal pain, no chest pain, no wheezing or difficulty breathing.   Fever      Home Medications Prior to Admission medications   Medication Sig Start Date End Date Taking? Authorizing Provider  amoxicillin-clavulanate (AUGMENTIN) 875-125 MG tablet Take 1 tablet by mouth every 12 (twelve) hours for 13 doses. 02/08/22 02/15/22 Yes Elgie Congo, MD  fluticasone (FLONASE) 50 MCG/ACT nasal spray Place 2 sprays into both nostrils daily for 10 days. 02/08/22 02/18/22 Yes Elgie Congo, MD  cyclobenzaprine (FLEXERIL) 10 MG tablet Take 1 tablet (10 mg total) by mouth 2 (two) times daily as needed for muscle spasms. 12/04/20   Marcello Fennel, PA-C  hydrocortisone (ANUSOL-HC) 2.5 % rectal cream Place 1 Application rectally 2 (two) times daily. 11/12/21   Henderly, Britni A, PA-C  lidocaine (XYLOCAINE) 2 % solution Use as directed 15 mLs in the mouth or throat as needed for mouth pain. 11/12/21   Henderly, Britni A, PA-C  metroNIDAZOLE (FLAGYL) 500 MG tablet Take 1 tablet (500 mg total) by mouth 2 (two) times daily. 02/25/21   Constant, Peggy, MD  metroNIDAZOLE (FLAGYL) 500  MG tablet Take 1 tablet (500 mg total) by mouth 2 (two) times daily. 03/18/21   Constant, Peggy, MD  predniSONE (DELTASONE) 10 MG tablet Take 3 tablets (30 mg total) by mouth daily. 12/04/20   Marcello Fennel, PA-C  spironolactone (ALDACTONE) 50 MG tablet Take 1 tablet (50 mg total) by mouth 2 (two) times daily. Will start with 50 mg twice daily, and increase to 100 mg twice daily as needed 04/02/21   Constant, Peggy, MD      Allergies    Tramadol    Review of Systems   Review of Systems  Constitutional:  Positive for fever.    Physical Exam Updated Vital Signs BP 100/70 (BP Location: Left Arm)   Pulse 82   Temp 99.6 F (37.6 C) (Oral)   Resp 16   Ht '5\' 3"'$  (1.6 m)   Wt 59 kg   LMP 04/17/2016 (LMP Unknown)   SpO2 98%   BMI 23.03 kg/m  Physical Exam Constitutional: Alert and oriented.  Slightly fatigued but nontoxic no acute distress acting appropriately Eyes: Conjunctivae are normal. ENT      Head: Normocephalic and atraumatic. Ears: Right TM nonerythematous and nonbulging, left TM erythematous with purulent fluid collection in the lower area of the TM      Nose: + congestion.      Mouth/Throat: Mucous membranes are moist.  No oropharyngeal erythema, edema or exudate, uvula midline      Neck:  No stridor.  No drooling no tripoding Cardiovascular: S1, S2,  Normal and symmetric distal pulses are present in all extremities.Warm and well perfused. Respiratory: Normal respiratory effort. Breath sounds are normal.  O2 sat 97 on RA Gastrointestinal: Soft and nondistended  musculoskeletal: Normal range of motion in all extremities. Neurologic: Normal speech and language.  Moving all extremities equally.  No facial droop.  No gross focal neurologic deficits are appreciated. Skin: Skin is warm, dry and intact. No rash noted. Psychiatric: Mood and affect are normal. Speech and behavior are normal.  ED Results / Procedures / Treatments   Labs (all labs ordered are listed, but only  abnormal results are displayed) Labs Reviewed  RESP PANEL BY RT-PCR (RSV, FLU A&B, COVID)  RVPGX2 - Abnormal; Notable for the following components:      Result Value   Influenza A by PCR POSITIVE (*)    All other components within normal limits    EKG None  Radiology No results found.  Procedures Procedures    Medications Ordered in ED Medications  acetaminophen (TYLENOL) tablet 650 mg (650 mg Oral Given 02/08/22 0954)  amoxicillin-clavulanate (AUGMENTIN) 875-125 MG per tablet 1 tablet (1 tablet Oral Given 02/08/22 1135)    ED Course/ Medical Decision Making/ A&P                           Medical Decision Making [name] is a 36 y.o. female.  With PMH of seizures who presents with cough, congestion, fevers and ear pain over the past 4 to 5 days.  This is in the setting of her husband who recently tested flu positive.   Overall, this is a well appearing patient presenting with mild viral-like symptoms, perhaps secondary to COVID, influenza, or any other non-specific virus specially given high prevalence of disease in surrounding community. Will swab for COVID/influenza.  She did test positive for influenza a today.  Additionally, her exam was concerning for a possible bacterial otitis media on the left.  Plan to prescribe Augmentin for 7 days for symptom control as well as fluticasone for nasal congestion.  Vitals as documented. On exam, normal work of breathing. Speaking in full sentences without difficulty. No respiratory distress and overall non-toxic appearing. No evidence of hypoxia.  Based on the patient's medical screening exam, including their history, vitals, and physical exam, the patient is stable for further outpatient evaluation of their symptoms. There is no evidence of respiratory distress, systemic toxicity, hemodynamic compromise, or emergent medical condition.  Therefore, I do not feel imaging or laboratory work is required beyond the viral  swabs.  Regarding my thought process, this patient has signs and symptoms consistent with a viral syndrome. There is no evidence to suggest serious bacterial infection at this time, including bacteremia. There is no stridor or difficulty handling secretions to suggest a severe bacterial upper respiratory illness. Signs, symptoms and clinical appearance are not consistent with meningitis or encephalitis.  At this time I do not believe this to be pericarditis or myocarditis given predominant respiratory symptoms and no signs of fluid overload.   Results and decision making discussed in depth with the patient. I discussed plan to discharge the patient and the important need for follow up. They agree with plan.  They are getting prescription for fluticasone spray and 7 days Augmentin for suspected superimposed bacterial otitis media of the left ear.  Advised continued supportive care with p.o. hydration Tylenol and ibuprofen.  Strict return  precaution discussed.  Patient agreement with plan safe for discharge.   Risk OTC drugs.   Final Clinical Impression(s) / ED Diagnoses Final diagnoses:  Influenza A  Acute otitis media, unspecified otitis media type    Rx / DC Orders ED Discharge Orders          Ordered    amoxicillin-clavulanate (AUGMENTIN) 875-125 MG tablet  Every 12 hours        02/08/22 1130    fluticasone (FLONASE) 50 MCG/ACT nasal spray  Daily        02/08/22 1130              Elgie Congo, MD 02/08/22 1137

## 2022-02-08 NOTE — ED Triage Notes (Signed)
Patient presents C/O congestion, fever, loss of taste/smell. Sick contacts.

## 2022-02-08 NOTE — Discharge Instructions (Signed)
You have been seen in the Emergency Department (ED)  today for cough and congestion and ear pain.  You are influenza A positive.  Additionally you are getting antibiotics to treat an ear infection.  Complete the course of antibiotics.  You can use the Flonase as prescribed for congestion.  Take Tylenol and ibuprofen for fevers and pain.  Return to the Emergency Department (ED)  if you have worsening trouble breathing, chest pain, difficulty swallowing, neck pain or stiffness or other symptoms that concern you.  Please make an appointment to follow up with your primary care doctor within one week to assure improvement or resolution in symptoms.  Come back if any severe difficulty breathing, uncontrollable vomiting, severe chest pain or any other symptoms concerning to you.

## 2022-05-06 ENCOUNTER — Other Ambulatory Visit: Payer: Self-pay

## 2022-05-06 ENCOUNTER — Other Ambulatory Visit (HOSPITAL_BASED_OUTPATIENT_CLINIC_OR_DEPARTMENT_OTHER): Payer: Self-pay

## 2022-05-06 ENCOUNTER — Emergency Department (HOSPITAL_BASED_OUTPATIENT_CLINIC_OR_DEPARTMENT_OTHER)
Admission: EM | Admit: 2022-05-06 | Discharge: 2022-05-06 | Disposition: A | Discharge status: Home / Self Care | Payer: BLUE CROSS/BLUE SHIELD | Attending: Emergency Medicine | Admitting: Emergency Medicine

## 2022-05-06 ENCOUNTER — Encounter (HOSPITAL_BASED_OUTPATIENT_CLINIC_OR_DEPARTMENT_OTHER): Payer: Self-pay | Admitting: Emergency Medicine

## 2022-05-06 DIAGNOSIS — J029 Acute pharyngitis, unspecified: Secondary | ICD-10-CM | POA: Insufficient documentation

## 2022-05-06 DIAGNOSIS — Z1152 Encounter for screening for COVID-19: Secondary | ICD-10-CM | POA: Insufficient documentation

## 2022-05-06 LAB — RESP PANEL BY RT-PCR (RSV, FLU A&B, COVID)  RVPGX2
Influenza A by PCR: NEGATIVE
Influenza B by PCR: NEGATIVE
Resp Syncytial Virus by PCR: NEGATIVE
SARS Coronavirus 2 by RT PCR: NEGATIVE

## 2022-05-06 LAB — GROUP A STREP BY PCR: Group A Strep by PCR: NOT DETECTED

## 2022-05-06 MED ORDER — BENZONATATE 100 MG PO CAPS
100.0000 mg | ORAL_CAPSULE | Freq: Three times a day (TID) | ORAL | 0 refills | Status: AC | PRN
  Filled 2022-05-06: qty 21, 7d supply, fill #0

## 2022-05-06 NOTE — Discharge Instructions (Addendum)
Note the workup today was overall reassuring.  You tested negative for covid, flu, rsv, group a strep.  As discussed, recommend symptomatic therapy at home with allergy medicine in the form of Zyrtec/Allegra/Claritin, nasal steroid spray in the form of Nasacort/Flonase, decongestion in the form of pseudoephedrine (you have to ask pharmacist for this medicine).  You may continue at home teas if you find them beneficial.  I also recommend over-the-counter Cepacol throat lozenges for your sore throat.  You can also take Tylenol/Motrin as needed for pain in your throat.  Please do not hesitate to return to emergency department if the worrisome signs and symptoms we discussed become apparent.

## 2022-05-06 NOTE — ED Provider Notes (Signed)
[facility] Provider Note   CSN: [id number] Arrival date & time: 05/06/22  [id number]     History  Chief Complaint  Patient presents with   Sore Throat    Regina Villegas is a 37 y.o. female.  HPI   37 year old female presents emergency department with complaints of sore throat.  Patient states he has had sore throat for the past 3 days.  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  Reports hoarseness of voice.  Reports pain with swallowing but has no difficulty swallowing foods or liquids or feelings of shortness of breath.  Reports subjective fever last night of which was present temporarily with no antipyretic administered this morning and no fever.  Reports cough, nasal congestion, ear pressure.  Denies abdominal pain, nausea, vomiting, urinary symptoms, change in bowel habits.  Past medical history significant for seizure, anxiety, condyloma acuminata, lichen simplex chronicus,  Home Medications Prior to Admission medications   Medication Sig Start Date End Date Taking? Authorizing Provider  benzonatate (TESSALON) 100 MG capsule Take 1 capsule (100 mg total) by mouth 3 (three) times daily as needed for cough. 05/06/22  Yes Dion Saucier A, PA  cyclobenzaprine (FLEXERIL) 10 MG tablet Take 1 tablet (10 mg total) by mouth 2 (two) times daily as needed for muscle spasms. 12/04/20   Marcello Fennel, PA-C  fluticasone (FLONASE) 50 MCG/ACT nasal spray Place 2 sprays into both nostrils daily for 10 days. 02/08/22 02/18/22  Elgie Congo, MD  hydrocortisone (ANUSOL-HC) 2.5 % rectal cream Place 1 Application rectally 2 (two) times daily. 11/12/21   Henderly, Britni A, PA-C  lidocaine (XYLOCAINE) 2 % solution Use as directed 15 mLs in the mouth or throat as needed for mouth pain. 11/12/21   Henderly, Britni A, PA-C  metroNIDAZOLE (FLAGYL) 500 MG tablet Take 1 tablet (500 mg total)  by mouth 2 (two) times daily. 02/25/21   Constant, Peggy, MD  metroNIDAZOLE (FLAGYL) 500 MG tablet Take 1 tablet (500 mg total) by mouth 2 (two) times daily. 03/18/21   Constant, Peggy, MD  predniSONE (DELTASONE) 10 MG tablet Take 3 tablets (30 mg total) by mouth daily. 12/04/20   Marcello Fennel, PA-C  spironolactone (ALDACTONE) 50 MG tablet Take 1 tablet (50 mg total) by mouth 2 (two) times daily. Will start with 50 mg twice daily, and increase to 100 mg twice daily as needed 04/02/21   Constant, Peggy, MD      Allergies    Tramadol    Review of Systems   Review of Systems  All other systems reviewed and are negative.   Physical Exam Updated Vital Signs BP 113/80 (BP Location: Right Arm)   Pulse 82   Temp 98.4 F (36.9 C) (Oral)   Resp 12   LMP 04/17/2016 (LMP Unknown)   SpO2 98%  Physical Exam Vitals and nursing note reviewed.  Constitutional:      General: She is not in acute distress.    Appearance: She is well-developed.  HENT:     Head: Normocephalic and atraumatic.     Right Ear: Ear canal and external ear normal.     Left Ear: Ear canal and external ear normal.     Ears:     Comments: Bilateral TM not erythematic in appearance with clear, translucent fluid appreciated.    Nose: Congestion present.     Mouth/Throat:     Mouth: Mucous membranes are moist.  Comments: Moderate posterior pharyngeal erythema.  Tonsils 0+ bilaterally with no obvious exudate.  No sublingual or submandibular swelling appreciated.  Uvula midline and rises symmetrically with phonation. Eyes:     Conjunctiva/sclera: Conjunctivae normal.  Cardiovascular:     Rate and Rhythm: Normal rate and regular rhythm.     Heart sounds: No murmur heard. Pulmonary:     Effort: Pulmonary effort is normal. No respiratory distress.     Breath sounds: Normal breath sounds. No wheezing, rhonchi or rales.  Abdominal:     Palpations: Abdomen is soft.     Tenderness: There is no right CVA tenderness, left  CVA tenderness, guarding or rebound.  Musculoskeletal:        General: No swelling.     Cervical back: Neck supple.     Right lower leg: No edema.     Left lower leg: No edema.  Skin:    General: Skin is warm and dry.     Capillary Refill: Capillary refill takes less than 2 seconds.  Neurological:     Mental Status: She is alert.  Psychiatric:        Mood and Affect: Mood normal.     ED Results / Procedures / Treatments   Labs (all labs ordered are listed, but only abnormal results are displayed) Labs Reviewed  GROUP A STREP BY PCR  RESP PANEL BY RT-PCR (RSV, FLU A&B, COVID)  [id number]    EKG None  Radiology No results found.  Procedures Procedures    Medications Ordered in ED Medications - No data to display  ED Course/ Medical Decision Making/ A&P                             Medical Decision Making Risk Prescription drug management.   This patient presents to the ED for concern of sore throat, this involves an extensive number of treatment options, and is a complaint that carries with it a high risk of complications and morbidity.  The differential diagnosis includes viral pharyngitis, strep pharyngitis, Lemierre's disease, Ludwig angina, anaphylaxis, necrotizing ulcerative gingivitis, angioedema   Co morbidities that complicate the patient evaluation  See HPI   Additional history obtained:  Additional history obtained from EMR External records from outside source obtained and reviewed including hospital records   Lab Tests:  I Ordered, and personally interpreted labs.  The pertinent results include: Respiratory viral panel negative.  Group A strep negative   Imaging Studies ordered:  N/a   Cardiac Monitoring: / EKG:  The patient was maintained on a cardiac monitor.  I personally viewed and interpreted the cardiac monitored which showed an underlying rhythm of: Sinus rhythm   Consultations Obtained:  N/a   Problem List / ED Course /  Critical interventions / Medication management  Viral pharyngitis Reevaluation of the patient showed that the patient stayed the same I have reviewed the patients home medicines and have made adjustments as needed   Social Determinants of Health:  Reports some cigarette use.  Denies illicit drug use.   Test / Admission - Considered:  Viral pharyngitis Vitals signs within normal range and stable throughout visit. Laboratory studies significant for: See above Patient with evidence of sore throat most likely secondary to viral pharyngitis.  Patient without clinical indication for Ludwig angina, Lemierre's disease.  Patient taken negative for group A strep, COVID, RSV, and influenza.  Patient afebrile, in no acute distress, tolerating p.o. without difficulty.  Will treat  patient's symptoms in the form of antihistamine, nasal steroid spray, decongestion.  Recommend follow-up with primary care for reassessment of symptoms.  Treatment plan discussed at length with patient and she acknowledged understanding was agreeable to said plan. Worrisome signs and symptoms were discussed with the patient, and the patient acknowledged understanding to return to the ED if noticed. Patient was stable upon discharge.          Final Clinical Impression(s) / ED Diagnoses Final diagnoses:  Sore throat    Rx / DC Orders ED Discharge Orders          Ordered    benzonatate (TESSALON) 100 MG capsule  3 times daily PRN        05/06/22 1015              Wilnette Kales, [place] 05/06/22 1023    Wynona Dove A, DO 05/06/22 1528

## 2022-05-06 NOTE — ED Triage Notes (Signed)
Pt reports sore throat x 3 days. Pt reports fever last night.

## 2022-05-13 ENCOUNTER — Other Ambulatory Visit (HOSPITAL_BASED_OUTPATIENT_CLINIC_OR_DEPARTMENT_OTHER): Payer: Self-pay

## 2022-07-09 DIAGNOSIS — M9903 Segmental and somatic dysfunction of lumbar region: Secondary | ICD-10-CM | POA: Diagnosis not present

## 2022-07-09 DIAGNOSIS — M5032 Other cervical disc degeneration, mid-cervical region, unspecified level: Secondary | ICD-10-CM | POA: Diagnosis not present

## 2022-07-09 DIAGNOSIS — M9902 Segmental and somatic dysfunction of thoracic region: Secondary | ICD-10-CM | POA: Diagnosis not present

## 2022-07-09 DIAGNOSIS — M5417 Radiculopathy, lumbosacral region: Secondary | ICD-10-CM | POA: Diagnosis not present

## 2022-07-09 DIAGNOSIS — M9901 Segmental and somatic dysfunction of cervical region: Secondary | ICD-10-CM | POA: Diagnosis not present

## 2022-07-17 DIAGNOSIS — M9901 Segmental and somatic dysfunction of cervical region: Secondary | ICD-10-CM | POA: Diagnosis not present

## 2022-07-17 DIAGNOSIS — M5417 Radiculopathy, lumbosacral region: Secondary | ICD-10-CM | POA: Diagnosis not present

## 2022-07-17 DIAGNOSIS — M5032 Other cervical disc degeneration, mid-cervical region, unspecified level: Secondary | ICD-10-CM | POA: Diagnosis not present

## 2022-07-17 DIAGNOSIS — M9903 Segmental and somatic dysfunction of lumbar region: Secondary | ICD-10-CM | POA: Diagnosis not present

## 2022-07-17 DIAGNOSIS — M9902 Segmental and somatic dysfunction of thoracic region: Secondary | ICD-10-CM | POA: Diagnosis not present

## 2022-07-22 DIAGNOSIS — M9903 Segmental and somatic dysfunction of lumbar region: Secondary | ICD-10-CM | POA: Diagnosis not present

## 2022-07-22 DIAGNOSIS — M9901 Segmental and somatic dysfunction of cervical region: Secondary | ICD-10-CM | POA: Diagnosis not present

## 2022-07-22 DIAGNOSIS — M5417 Radiculopathy, lumbosacral region: Secondary | ICD-10-CM | POA: Diagnosis not present

## 2022-07-22 DIAGNOSIS — M5032 Other cervical disc degeneration, mid-cervical region, unspecified level: Secondary | ICD-10-CM | POA: Diagnosis not present

## 2022-07-22 DIAGNOSIS — M9902 Segmental and somatic dysfunction of thoracic region: Secondary | ICD-10-CM | POA: Diagnosis not present

## 2022-11-07 DIAGNOSIS — M9905 Segmental and somatic dysfunction of pelvic region: Secondary | ICD-10-CM | POA: Diagnosis not present

## 2022-11-07 DIAGNOSIS — M5417 Radiculopathy, lumbosacral region: Secondary | ICD-10-CM | POA: Diagnosis not present

## 2022-11-07 DIAGNOSIS — M9902 Segmental and somatic dysfunction of thoracic region: Secondary | ICD-10-CM | POA: Diagnosis not present

## 2022-11-07 DIAGNOSIS — M9901 Segmental and somatic dysfunction of cervical region: Secondary | ICD-10-CM | POA: Diagnosis not present

## 2022-11-07 DIAGNOSIS — M9903 Segmental and somatic dysfunction of lumbar region: Secondary | ICD-10-CM | POA: Diagnosis not present

## 2022-11-07 DIAGNOSIS — M5032 Other cervical disc degeneration, mid-cervical region, unspecified level: Secondary | ICD-10-CM | POA: Diagnosis not present

## 2022-12-31 DIAGNOSIS — Z13220 Encounter for screening for lipoid disorders: Secondary | ICD-10-CM | POA: Diagnosis not present

## 2022-12-31 DIAGNOSIS — R6889 Other general symptoms and signs: Secondary | ICD-10-CM | POA: Diagnosis not present

## 2022-12-31 DIAGNOSIS — Z8632 Personal history of gestational diabetes: Secondary | ICD-10-CM | POA: Diagnosis not present

## 2022-12-31 DIAGNOSIS — Z0000 Encounter for general adult medical examination without abnormal findings: Secondary | ICD-10-CM | POA: Diagnosis not present

## 2022-12-31 DIAGNOSIS — Z8759 Personal history of other complications of pregnancy, childbirth and the puerperium: Secondary | ICD-10-CM | POA: Diagnosis not present

## 2022-12-31 DIAGNOSIS — R1310 Dysphagia, unspecified: Secondary | ICD-10-CM | POA: Diagnosis not present

## 2023-01-01 DIAGNOSIS — Z113 Encounter for screening for infections with a predominantly sexual mode of transmission: Secondary | ICD-10-CM | POA: Diagnosis not present

## 2023-01-02 DIAGNOSIS — K1120 Sialoadenitis, unspecified: Secondary | ICD-10-CM | POA: Diagnosis not present

## 2023-01-05 ENCOUNTER — Other Ambulatory Visit: Payer: Self-pay | Admitting: Family Medicine

## 2023-01-05 DIAGNOSIS — K1120 Sialoadenitis, unspecified: Secondary | ICD-10-CM

## 2023-01-06 ENCOUNTER — Other Ambulatory Visit: Payer: BLUE CROSS/BLUE SHIELD

## 2023-01-06 ENCOUNTER — Ambulatory Visit
Admission: RE | Admit: 2023-01-06 | Discharge: 2023-01-06 | Disposition: A | Discharge status: Home / Self Care | Payer: BC Managed Care – PPO | Source: Ambulatory Visit | Attending: Family Medicine | Admitting: Family Medicine

## 2023-01-06 DIAGNOSIS — K1120 Sialoadenitis, unspecified: Secondary | ICD-10-CM

## 2023-01-06 DIAGNOSIS — R221 Localized swelling, mass and lump, neck: Secondary | ICD-10-CM | POA: Diagnosis not present

## 2023-01-08 ENCOUNTER — Emergency Department (HOSPITAL_COMMUNITY): Payer: BC Managed Care – PPO

## 2023-01-08 ENCOUNTER — Emergency Department (HOSPITAL_COMMUNITY)
Admission: EM | Admit: 2023-01-08 | Discharge: 2023-01-08 | Disposition: A | Discharge status: Home / Self Care | Payer: BC Managed Care – PPO | Attending: Emergency Medicine | Admitting: Emergency Medicine

## 2023-01-08 ENCOUNTER — Other Ambulatory Visit: Payer: Self-pay

## 2023-01-08 ENCOUNTER — Encounter (HOSPITAL_COMMUNITY): Payer: Self-pay

## 2023-01-08 DIAGNOSIS — M542 Cervicalgia: Secondary | ICD-10-CM | POA: Diagnosis not present

## 2023-01-08 DIAGNOSIS — R220 Localized swelling, mass and lump, head: Secondary | ICD-10-CM | POA: Diagnosis not present

## 2023-01-08 DIAGNOSIS — R590 Localized enlarged lymph nodes: Secondary | ICD-10-CM | POA: Insufficient documentation

## 2023-01-08 DIAGNOSIS — R221 Localized swelling, mass and lump, neck: Secondary | ICD-10-CM | POA: Insufficient documentation

## 2023-01-08 LAB — CBC WITH DIFFERENTIAL/PLATELET
Abs Immature Granulocytes: 0 10*3/uL (ref 0.00–0.07)
Basophils Absolute: 0 10*3/uL (ref 0.0–0.1)
Basophils Relative: 0 %
Eosinophils Absolute: 0 10*3/uL (ref 0.0–0.5)
Eosinophils Relative: 1 %
HCT: 37.8 % (ref 36.0–46.0)
Hemoglobin: 12.8 g/dL (ref 12.0–15.0)
Immature Granulocytes: 0 %
Lymphocytes Relative: 40 %
Lymphs Abs: 1.9 10*3/uL (ref 0.7–4.0)
MCH: 31.7 pg (ref 26.0–34.0)
MCHC: 33.9 g/dL (ref 30.0–36.0)
MCV: 93.6 fL (ref 80.0–100.0)
Monocytes Absolute: 0.3 10*3/uL (ref 0.1–1.0)
Monocytes Relative: 6 %
Neutro Abs: 2.5 10*3/uL (ref 1.7–7.7)
Neutrophils Relative %: 53 %
Platelets: 240 10*3/uL (ref 150–400)
RBC: 4.04 MIL/uL (ref 3.87–5.11)
RDW: 13.9 % (ref 11.5–15.5)
WBC: 4.8 10*3/uL (ref 4.0–10.5)
nRBC: 0 % (ref 0.0–0.2)

## 2023-01-08 LAB — BASIC METABOLIC PANEL
Anion gap: 5 (ref 5–15)
BUN: 13 mg/dL (ref 6–20)
CO2: 26 mmol/L (ref 22–32)
Calcium: 8.9 mg/dL (ref 8.9–10.3)
Chloride: 105 mmol/L (ref 98–111)
Creatinine, Ser: 0.69 mg/dL (ref 0.44–1.00)
GFR, Estimated: >60 mL/min (ref >60)
Glucose, Bld: 85 mg/dL (ref 70–99)
Potassium: 4.4 mmol/L (ref 3.5–5.1)
Sodium: 136 mmol/L (ref 135–145)

## 2023-01-08 LAB — I-STAT CHEM 8, ED
BUN: 13 mg/dL (ref 6–20)
Calcium, Ion: 1.18 mmol/L (ref 1.15–1.40)
Chloride: 100 mmol/L (ref 98–111)
Creatinine, Ser: 0.8 mg/dL (ref 0.44–1.00)
Glucose, Bld: 84 mg/dL (ref 70–99)
HCT: 38 % (ref 36.0–46.0)
Hemoglobin: 12.9 g/dL (ref 12.0–15.0)
Potassium: 4.4 mmol/L (ref 3.5–5.1)
Sodium: 141 mmol/L (ref 135–145)
TCO2: 24 mmol/L (ref 22–32)

## 2023-01-08 LAB — TSH: TSH: 0.363 u[IU]/mL (ref 0.350–4.500)

## 2023-01-08 MED ORDER — IOHEXOL 350 MG/ML SOLN: 75.0000 mL | Freq: Once | INTRAVENOUS | Status: DC | PRN

## 2023-01-08 MED ORDER — IOHEXOL 300 MG/ML  SOLN
75.0000 mL | Freq: Once | INTRAMUSCULAR | Status: AC | PRN
  Administered 2023-01-08: 75 mL via INTRAVENOUS

## 2023-01-08 NOTE — Discharge Instructions (Signed)
Return here for any trouble swallowing or breathing that is different than what you have had before

## 2023-01-08 NOTE — ED Triage Notes (Addendum)
Sent from PCP for neck swelling. Patient reports lost of voice, neck swelling, and facial swelling x 2 days. Patient was given benadryl and solumedrol at doctors office. VSS, voice horse, but able to talk in full sentences.

## 2023-01-08 NOTE — ED Provider Notes (Signed)
[place] EMERGENCY DEPARTMENT AT [facility] Provider Note   CSN: [id number] Arrival date & time: 01/08/23  1358     History  Chief Complaint  Patient presents with   Neck Swelling    Regina Villegas is a 37 y.o. female.  37 year old female presents with several month history of left-sided neck swelling.  States has been intermittent in nature.  Noted that her voice recently changed yesterday.  Some trouble swallowing.  Denies any trouble breathing.  Went to [place] [facility] today, and those notes were reviewed from the outside source.  Negative x-rays were done there.  Due to patient's increasing trouble swelling patient sent here for further evaluation and had CT of her neck.  She denies any hemoptysis.  No recent weight loss.  No other swelling noted.  No recent fever or sore throat.  Denies any tooth pain       Home Medications Prior to Admission medications   Medication Sig Start Date End Date Taking? Authorizing Provider  benzonatate (TESSALON) 100 MG capsule Take 1 capsule (100 mg total) by mouth 3 (three) times daily as needed for cough. 05/06/22   Peter Garter, PA  cyclobenzaprine (FLEXERIL) 10 MG tablet Take 1 tablet (10 mg total) by mouth 2 (two) times daily as needed for muscle spasms. 12/04/20   Carroll Sage, PA-C  fluticasone (FLONASE) 50 MCG/ACT nasal spray Place 2 sprays into both nostrils daily for 10 days. 02/08/22 02/18/22  Mardene Sayer, MD  hydrocortisone (ANUSOL-HC) 2.5 % rectal cream Place 1 Application rectally 2 (two) times daily. 11/12/21   Henderly, Britni A, PA-C  lidocaine (XYLOCAINE) 2 % solution Use as directed 15 mLs in the mouth or throat as needed for mouth pain. 11/12/21   Henderly, Britni A, PA-C  metroNIDAZOLE (FLAGYL) 500 MG tablet Take 1 tablet (500 mg total) by mouth 2 (two) times daily. 02/25/21   Constant, Peggy, MD  metroNIDAZOLE (FLAGYL) 500 MG tablet Take 1 tablet (500 mg total) by mouth 2 (two) times daily. 03/18/21    Constant, Peggy, MD  predniSONE (DELTASONE) 10 MG tablet Take 3 tablets (30 mg total) by mouth daily. 12/04/20   Carroll Sage, PA-C  spironolactone (ALDACTONE) 50 MG tablet Take 1 tablet (50 mg total) by mouth 2 (two) times daily. Will start with 50 mg twice daily, and increase to 100 mg twice daily as needed 04/02/21   Constant, Peggy, MD      Allergies    Tramadol    Review of Systems   Review of Systems  All other systems reviewed and are negative.   Physical Exam Updated Vital Signs BP 108/73 (BP Location: Right Arm)   Pulse 69   Temp 98 F (36.7 C) (Oral)   Resp 18   Ht 1.575 m (5\' 2" )   Wt 53.8 kg   LMP 04/17/2016 (LMP Unknown)   SpO2 100%   BMI 21.69 kg/m  Physical Exam Vitals and nursing note reviewed.  Constitutional:      General: She is not in acute distress.    Appearance: Normal appearance. She is well-developed. She is not toxic-appearing.  HENT:     Head: Normocephalic and atraumatic.  Eyes:     General: Lids are normal.     Conjunctiva/sclera: Conjunctivae normal.     Pupils: Pupils are equal, round, and reactive to light.  Neck:     Thyroid: No thyroid mass.     Trachea: No tracheal deviation.     Comments:  Submandibular edema appreciated.  No erythema appreciated Cardiovascular:     Rate and Rhythm: Normal rate and regular rhythm.     Heart sounds: Normal heart sounds. No murmur heard.    No gallop.  Pulmonary:     Effort: Pulmonary effort is normal. No respiratory distress.     Breath sounds: Normal breath sounds. No stridor. No decreased breath sounds, wheezing, rhonchi or rales.  Abdominal:     General: There is no distension.     Palpations: Abdomen is soft.     Tenderness: There is no abdominal tenderness. There is no rebound.  Musculoskeletal:        General: No tenderness. Normal range of motion.     Cervical back: Normal range of motion and neck supple.  Lymphadenopathy:     Cervical: Cervical adenopathy present.  Skin:     General: Skin is warm and dry.     Findings: No abrasion or rash.  Neurological:     Mental Status: She is alert and oriented to person, place, and time. Mental status is at baseline.     GCS: GCS eye subscore is 4. GCS verbal subscore is 5. GCS motor subscore is 6.     Cranial Nerves: Cranial nerves are intact. No cranial nerve deficit.     Sensory: No sensory deficit.     Motor: Motor function is intact.  Psychiatric:        Attention and Perception: Attention normal.        Speech: Speech normal.        Behavior: Behavior normal.     ED Results / Procedures / Treatments   Labs (all labs ordered are listed, but only abnormal results are displayed) Labs Reviewed  BASIC METABOLIC PANEL  CBC WITH DIFFERENTIAL/PLATELET  TSH  I-STAT CHEM 8, ED    EKG None  Radiology No results found.  Procedures Procedures    Medications Ordered in ED Medications - No data to display  ED Course/ Medical Decision Making/ A&P                                 Medical Decision Making  Patient has no prior here.  Upon further questioning, symptoms have been present for about 6 months with regard to subjective swelling at the left side of her neck.  Laboratory studies are reassuring here.  CT soft tissue neck per interpretation shows no acute findings.  Patient's airway is stable.  Will give ENT referral        Final Clinical Impression(s) / ED Diagnoses Final diagnoses:  None    Rx / DC Orders ED Discharge Orders     None         Lorre Nick, MD 01/08/23 2101

## 2023-01-08 NOTE — ED Provider Triage Note (Signed)
Emergency Medicine Provider Triage Evaluation Note  Regina Villegas , a 37 y.o. female  was evaluated in triage.  Pt complains of facial swelling and hoarseness for 2 days. Patient went to PCP to get established and PCP noticed facial swelling and lymphadenopathy and had her return and noticed it worsened. Patient has tried benadryl and solumedrol and is still able to breath and eat/drink. Denies fevers or thyroid issues or screaming often.  Review of Systems  Positive: See hpi Negative: See HPI  Physical Exam  BP 108/73 (BP Location: Right Arm)   Pulse 69   Temp 98 F (36.7 C) (Oral)   Resp 18   Ht 5\' 2"  (1.575 m)   Wt 53.8 kg   LMP 04/17/2016 (LMP Unknown)   SpO2 100%   BMI 21.69 kg/m  Gen:   Awake, no distress   Resp:  Normal effort  MSK:   Moves extremities without difficulty  Other:  Hoarse voice, mild neck and facial swelling, lungs CTAB, airway intact  Medical Decision Making  Medically screening exam initiated at 2:14 PM.  Appropriate orders placed.  Regina Villegas was informed that the remainder of the evaluation will be completed by another provider, this initial triage assessment does not replace that evaluation, and the importance of remaining in the ED until their evaluation is complete.  Workup started, pt stable   Netta Corrigan, [place] 01/08/23 1416

## 2023-01-13 DIAGNOSIS — M542 Cervicalgia: Secondary | ICD-10-CM | POA: Diagnosis not present

## 2023-02-02 DIAGNOSIS — M7912 Myalgia of auxiliary muscles, head and neck: Secondary | ICD-10-CM | POA: Diagnosis not present

## 2023-02-02 DIAGNOSIS — M542 Cervicalgia: Secondary | ICD-10-CM | POA: Diagnosis not present

## 2023-02-19 DIAGNOSIS — M542 Cervicalgia: Secondary | ICD-10-CM | POA: Diagnosis not present

## 2023-02-26 DIAGNOSIS — M542 Cervicalgia: Secondary | ICD-10-CM | POA: Diagnosis not present

## 2023-03-06 DIAGNOSIS — M542 Cervicalgia: Secondary | ICD-10-CM | POA: Diagnosis not present

## 2023-09-29 DIAGNOSIS — F332 Major depressive disorder, recurrent severe without psychotic features: Secondary | ICD-10-CM | POA: Diagnosis not present

## 2023-09-29 DIAGNOSIS — R5383 Other fatigue: Secondary | ICD-10-CM | POA: Diagnosis not present

## 2023-09-29 DIAGNOSIS — F419 Anxiety disorder, unspecified: Secondary | ICD-10-CM | POA: Diagnosis not present

## 2023-09-29 DIAGNOSIS — Z6379 Other stressful life events affecting family and household: Secondary | ICD-10-CM | POA: Diagnosis not present

## 2023-09-29 DIAGNOSIS — R634 Abnormal weight loss: Secondary | ICD-10-CM | POA: Diagnosis not present

## 2023-10-23 DIAGNOSIS — Z713 Dietary counseling and surveillance: Secondary | ICD-10-CM | POA: Diagnosis not present

## 2023-10-26 ENCOUNTER — Emergency Department (HOSPITAL_BASED_OUTPATIENT_CLINIC_OR_DEPARTMENT_OTHER)
Admission: EM | Admit: 2023-10-26 | Discharge: 2023-10-26 | Disposition: A | Discharge status: Home / Self Care | Attending: Emergency Medicine | Admitting: Emergency Medicine

## 2023-10-26 ENCOUNTER — Other Ambulatory Visit: Payer: Self-pay

## 2023-10-26 DIAGNOSIS — R1013 Epigastric pain: Secondary | ICD-10-CM | POA: Diagnosis not present

## 2023-10-26 DIAGNOSIS — K920 Hematemesis: Secondary | ICD-10-CM | POA: Diagnosis not present

## 2023-10-26 DIAGNOSIS — R634 Abnormal weight loss: Secondary | ICD-10-CM | POA: Diagnosis not present

## 2023-10-26 DIAGNOSIS — Z8544 Personal history of malignant neoplasm of other female genital organs: Secondary | ICD-10-CM | POA: Diagnosis not present

## 2023-10-26 DIAGNOSIS — R109 Unspecified abdominal pain: Secondary | ICD-10-CM | POA: Diagnosis not present

## 2023-10-26 LAB — CBC
HCT: 36.5 % (ref 36.0–46.0)
Hemoglobin: 12.7 g/dL (ref 12.0–15.0)
MCH: 31.1 pg (ref 26.0–34.0)
MCHC: 34.8 g/dL (ref 30.0–36.0)
MCV: 89.5 fL (ref 80.0–100.0)
Platelets: 291 K/uL (ref 150–400)
RBC: 4.08 MIL/uL (ref 3.87–5.11)
RDW: 13.9 % (ref 11.5–15.5)
WBC: 5.6 K/uL (ref 4.0–10.5)
nRBC: 0 % (ref 0.0–0.2)

## 2023-10-26 LAB — COMPREHENSIVE METABOLIC PANEL WITH GFR
ALT: 12 U/L (ref 0–44)
AST: 17 U/L (ref 15–41)
Albumin: 4.3 g/dL (ref 3.5–5.0)
Alkaline Phosphatase: 49 U/L (ref 38–126)
Anion gap: 12 (ref 5–15)
BUN: 10 mg/dL (ref 6–20)
CO2: 24 mmol/L (ref 22–32)
Calcium: 9.9 mg/dL (ref 8.9–10.3)
Chloride: 101 mmol/L (ref 98–111)
Creatinine, Ser: 0.71 mg/dL (ref 0.44–1.00)
GFR, Estimated: >60 mL/min (ref >60)
Glucose, Bld: 82 mg/dL (ref 70–99)
Potassium: 3.9 mmol/L (ref 3.5–5.1)
Sodium: 137 mmol/L (ref 135–145)
Total Bilirubin: 0.6 mg/dL (ref 0.0–1.2)
Total Protein: 6.9 g/dL (ref 6.5–8.1)

## 2023-10-26 LAB — URINALYSIS, ROUTINE W REFLEX MICROSCOPIC
Bilirubin Urine: NEGATIVE
Glucose, UA: NEGATIVE mg/dL
Hgb urine dipstick: NEGATIVE
Leukocytes,Ua: NEGATIVE
Nitrite: NEGATIVE
Protein, ur: NEGATIVE mg/dL
Specific Gravity, Urine: 1.019 (ref 1.005–1.030)
pH: 7 (ref 5.0–8.0)

## 2023-10-26 LAB — PREGNANCY, URINE: Preg Test, Ur: NEGATIVE

## 2023-10-26 LAB — LIPASE, BLOOD: Lipase: 28 U/L (ref 11–51)

## 2023-10-26 MED ORDER — PANTOPRAZOLE SODIUM 40 MG PO TBEC
40.0000 mg | DELAYED_RELEASE_TABLET | Freq: Once | ORAL | Status: AC
  Administered 2023-10-26: 40 mg via ORAL
  Filled 2023-10-26: qty 1

## 2023-10-26 MED ORDER — ONDANSETRON 4 MG PO TBDP: 4.0000 mg | ORAL_TABLET | Freq: Once | ORAL | Status: DC | PRN

## 2023-10-26 MED ORDER — SUCRALFATE 1 G PO TABS: 1.0000 g | ORAL_TABLET | Freq: Four times a day (QID) | ORAL | 0 refills | Status: AC

## 2023-10-26 MED ORDER — PANTOPRAZOLE SODIUM 40 MG PO TBEC: 40.0000 mg | DELAYED_RELEASE_TABLET | Freq: Every day | ORAL | 0 refills | Status: AC

## 2023-10-26 MED ORDER — SUCRALFATE 1 G PO TABS
1.0000 g | ORAL_TABLET | Freq: Once | ORAL | Status: AC
  Administered 2023-10-26: 1 g via ORAL
  Filled 2023-10-26: qty 1

## 2023-10-26 NOTE — ED Provider Notes (Signed)
 [place] EMERGENCY DEPARTMENT AT [facility] Provider Note   CSN: [id number] Arrival date & time: 10/26/23  1521     Patient presents with: Abdominal Pain   Regina Villegas is a 38 y.o. female with history of vulvar cancer, seizures, presents with concern for epigastric abdominal pain for the past 2 weeks.  Reports pain only occurs about 30 minutes after eating, and then will slowly go away once her food seems to digest.  She also reports some nausea associated with this.  She reported 1 episode of emesis earlier today, but no further episodes.  She has eaten food since this episode without difficulty.  She denies any fever or chills.  Reports she has been having normal bowel movements with her last bowel movement being this morning.  She reports some increased urinary frequency for the past couple of days, but denies any dysuria or hematuria.  Denies any flank pain.    Abdominal Pain      Prior to Admission medications   Medication Sig Start Date End Date Taking? Authorizing Provider  pantoprazole  (PROTONIX ) 40 MG tablet Take 1 tablet (40 mg total) by mouth daily. 10/27/23  Yes Veta Palma, PA-C  sucralfate  (CARAFATE ) 1 g tablet Take 1 tablet (1 g total) by mouth 4 (four) times daily. 10/26/23 11/25/23 Yes Veta Palma, PA-C  benzonatate  (TESSALON ) 100 MG capsule Take 1 capsule (100 mg total) by mouth 3 (three) times daily as needed for cough. 05/06/22   Silver Wonda LABOR, PA  cyclobenzaprine  (FLEXERIL ) 10 MG tablet Take 1 tablet (10 mg total) by mouth 2 (two) times daily as needed for muscle spasms. 12/04/20   Waylan Elsie PARAS, PA-C  fluticasone  (FLONASE ) 50 MCG/ACT nasal spray Place 2 sprays into both nostrils daily for 10 days. 02/08/22 02/18/22  Ethyl Richerd BROCKS, MD  hydrocortisone  (ANUSOL -HC) 2.5 % rectal cream Place 1 Application rectally 2 (two) times daily. 11/12/21   Henderly, Britni A, PA-C  lidocaine  (XYLOCAINE ) 2 % solution Use as directed 15 mLs in the mouth  or throat as needed for mouth pain. 11/12/21   Henderly, Britni A, PA-C  metroNIDAZOLE  (FLAGYL ) 500 MG tablet Take 1 tablet (500 mg total) by mouth 2 (two) times daily. 02/25/21   Constant, Peggy, MD  metroNIDAZOLE  (FLAGYL ) 500 MG tablet Take 1 tablet (500 mg total) by mouth 2 (two) times daily. 03/18/21   Constant, Peggy, MD  predniSONE  (DELTASONE ) 10 MG tablet Take 3 tablets (30 mg total) by mouth daily. 12/04/20   Waylan Elsie PARAS, PA-C  spironolactone  (ALDACTONE ) 50 MG tablet Take 1 tablet (50 mg total) by mouth 2 (two) times daily. Will start with 50 mg twice daily, and increase to 100 mg twice daily as needed 04/02/21   Constant, Peggy, MD    Allergies: Tramadol     Review of Systems  Gastrointestinal:  Positive for abdominal pain.    Updated Vital Signs BP (!) 128/95 (BP Location: Right Arm)   Pulse 82   Temp 98 F (36.7 C) (Oral)   Resp 20   Ht 5' 3 (1.6 m)   Wt 48.5 kg   LMP 04/17/2016 (LMP Unknown)   SpO2 100%   BMI 18.95 kg/m   Physical Exam Vitals and nursing note reviewed.  Constitutional:      General: She is not in acute distress.    Appearance: She is well-developed.     Comments: No vomiting  HENT:     Head: Normocephalic and atraumatic.  Eyes:     Conjunctiva/sclera:  Conjunctivae normal.  Cardiovascular:     Rate and Rhythm: Normal rate and regular rhythm.     Heart sounds: No murmur heard. Pulmonary:     Effort: Pulmonary effort is normal. No respiratory distress.     Breath sounds: Normal breath sounds.  Abdominal:     Palpations: Abdomen is soft.     Tenderness: There is abdominal tenderness.     Comments: Mild epigastric abdominal tenderness to palpation without rebound or guarding  Musculoskeletal:        General: No swelling.     Cervical back: Neck supple.  Skin:    General: Skin is warm and dry.     Capillary Refill: Capillary refill takes less than 2 seconds.  Neurological:     Mental Status: She is alert.  Psychiatric:        Mood and  Affect: Mood normal.     (all labs ordered are listed, but only abnormal results are displayed) Labs Reviewed  URINALYSIS, ROUTINE W REFLEX MICROSCOPIC - Abnormal; Notable for the following components:      Result Value   Ketones, ur TRACE (*)    All other components within normal limits  LIPASE, BLOOD  COMPREHENSIVE METABOLIC PANEL WITH GFR  CBC  PREGNANCY, URINE    EKG: None  Radiology: No results found.   Procedures   Medications Ordered in the ED  ondansetron  (ZOFRAN -ODT) disintegrating tablet 4 mg (has no administration in time range)  sucralfate  (CARAFATE ) tablet 1 g (has no administration in time range)  pantoprazole  (PROTONIX ) EC tablet 40 mg (has no administration in time range)                                    Medical Decision Making Amount and/or Complexity of Data Reviewed Labs: ordered.  Risk Prescription drug management.     Differential diagnosis includes but is not limited to Cannabinoid hyperemesis syndrome, acute cholecystitis, cholelithiasis, cholangitis, choledocholithiasis, peptic ulcer, gastritis, gastroenteritis, appendicitis, IBS, IBD, DKA, nephrolithiasis, UTI, pyelonephritis, pancreatitis, diverticulitis, mesenteric ischemia, abdominal aortic aneurysm, small bowel obstruction, volvulus, malignancy   ED Course:  Upon initial evaluation, patient is well-appearing, no acute distress.  Stable vitals.  She has had epigastric abdominal pain when eating for the past 2 weeks.  Epigastric area is tender to palpation, but without rebound or guarding.  She is not having any active vomiting.  Was able to eat a granola bar in the room without difficulty.   Labs Ordered: I Ordered, and personally interpreted labs.  The pertinent results include:   CBC within normal limits, no leukocytosis CMP within normal limits.  No electrolyte abnormalities, normal LFTs and creatinine Lipase within normal limits Urinalysis without signs of infection Pregnancy  test negative  Medications Given: Zofran  Protonix  Carafate   Upon re-evaluation, patient remains well-appearing with stable vitals.  Her lab work is reassuring.  Her CBC, CMP, and lipase are all within normal limits.  Given her abdominal pain is epigastric, and only occurs with eating, this sounds like patient might have a GI ulcer given labs are reassuring. She does not have any significant abdominal tenderness to palpation or peritonitic signs.  She has not had any acute worsening of pain to suggest possible perforation or other acute intra-abdominal pathology.  She did report some increased urinary frequency, but urinalysis without signs of infection.  This does not seem consistent with a cystitis at this time.  No flank pain  to suggest pyelonephritis.  I had a shared decision-making conversation with the patient.  Discussed that we could obtain CT abdomen pelvis today to evaluate for other pathologies to explain her pain such as SBO, malignancy.  Patient declines CT abdomen and pelvis at this time. I have low concern for SBO at this time given she is still having stools, passing gas, and able to tolerate p.o. intake. She does have history of vulvar cancer, but given pain occurring when eating rather than constant pain, this does sound more like a gastric ulcer.  She would like to treat for peptic ulcer and see if this helps with her symptoms first. I think this is appropriate at this time, but I did give patient strict return precautions, and she verbalized understanding.    Impression: Epigastric abdominal pain, likely gastric ulcer  Disposition:  The patient was discharged home with instructions to take 30-day course of Protonix  and Carafate .  Follow-up with GI within the next week.  She states she already has a GI provider and will reach out to them for an appointment for follow-up within the next month. Return precautions given.    This chart was dictated using voice recognition software,  Dragon. Despite the best efforts of this provider to proofread and correct errors, errors may still occur which can change documentation meaning.       Final diagnoses:  Epigastric abdominal pain    ED Discharge Orders          Ordered    pantoprazole  (PROTONIX ) 40 MG tablet  Daily        10/26/23 1953    sucralfate  (CARAFATE ) 1 g tablet  4 times daily        10/26/23 1953               Veta Palma, PA-C 10/26/23 1955    Dreama Longs, MD 10/27/23 1249

## 2023-10-26 NOTE — ED Triage Notes (Signed)
 Pt POV reporting mid upper abd pain x2 weeks, worsened today, episode of emesis with streaks of blood.

## 2023-10-26 NOTE — Discharge Instructions (Addendum)
 Your symptoms are likely due to an ulcer.  Please avoid alcohol, NSAIDs (ibuprofen , BC powders, Aleve, Advil ), spicy foods, acidic foods, as these can irritate your stomach.  You have been prescribed a medication called Protonix  to help treat your ulcer.  Please take this daily for the next 30 days starting tomorrow  You have been prescribed a medication called sucralfate  to help with your ulcer.  Please take this 4 times daily for the next 4 weeks as prescribed starting tomorrow morning.  Please contact your GI provider for follow-up appointment within the next month for recheck of symptoms and further evaluation.  The remainder of your labs are reassuring today.  Your blood counts, electrolytes, kidney, liver and liver function tests were normal today.  Your urine did not show any signs of infection.  As discussed, we will hold off on a CT today of your abdomen. It is possible that by not obtaining a CT scan today, we are missing another cause of your pain such as a bowel obstruction or malignancy.  Please return to the emergency room if you have any persistent vomiting, you are no longer having bowel movements or passing gas, you have worsening of your abdominal pain, fevers, any other new or concerning symptoms

## 2023-11-02 DIAGNOSIS — F419 Anxiety disorder, unspecified: Secondary | ICD-10-CM | POA: Diagnosis not present

## 2023-11-02 DIAGNOSIS — F32A Depression, unspecified: Secondary | ICD-10-CM | POA: Diagnosis not present

## 2023-11-02 DIAGNOSIS — F17200 Nicotine dependence, unspecified, uncomplicated: Secondary | ICD-10-CM | POA: Diagnosis not present

## 2023-11-06 ENCOUNTER — Other Ambulatory Visit: Payer: Self-pay | Admitting: Student

## 2023-11-06 DIAGNOSIS — R1013 Epigastric pain: Secondary | ICD-10-CM | POA: Diagnosis not present

## 2023-11-06 DIAGNOSIS — K219 Gastro-esophageal reflux disease without esophagitis: Secondary | ICD-10-CM | POA: Diagnosis not present

## 2023-11-06 DIAGNOSIS — R1010 Upper abdominal pain, unspecified: Secondary | ICD-10-CM

## 2023-11-06 DIAGNOSIS — R1310 Dysphagia, unspecified: Secondary | ICD-10-CM | POA: Diagnosis not present

## 2023-11-06 DIAGNOSIS — R109 Unspecified abdominal pain: Secondary | ICD-10-CM

## 2023-11-06 DIAGNOSIS — R110 Nausea: Secondary | ICD-10-CM | POA: Diagnosis not present

## 2023-11-09 ENCOUNTER — Other Ambulatory Visit (HOSPITAL_COMMUNITY): Payer: Self-pay

## 2023-11-11 ENCOUNTER — Ambulatory Visit
Admission: RE | Admit: 2023-11-11 | Discharge: 2023-11-11 | Disposition: A | Discharge status: Home / Self Care | Source: Ambulatory Visit | Attending: Student | Admitting: Student

## 2023-11-11 DIAGNOSIS — R1010 Upper abdominal pain, unspecified: Secondary | ICD-10-CM | POA: Diagnosis not present

## 2023-11-11 MED ORDER — IOPAMIDOL (ISOVUE-300) INJECTION 61%
80.0000 mL | Freq: Once | INTRAVENOUS | Status: AC | PRN
Start: 2023-11-11 — End: 2023-11-11
  Administered 2023-11-11: 80 mL via INTRAVENOUS

## 2023-11-17 DIAGNOSIS — F332 Major depressive disorder, recurrent severe without psychotic features: Secondary | ICD-10-CM | POA: Diagnosis not present

## 2023-11-23 DIAGNOSIS — K319 Disease of stomach and duodenum, unspecified: Secondary | ICD-10-CM | POA: Diagnosis not present

## 2023-11-23 DIAGNOSIS — R1310 Dysphagia, unspecified: Secondary | ICD-10-CM | POA: Diagnosis not present

## 2023-11-23 DIAGNOSIS — K2970 Gastritis, unspecified, without bleeding: Secondary | ICD-10-CM | POA: Diagnosis not present

## 2023-11-23 DIAGNOSIS — R1013 Epigastric pain: Secondary | ICD-10-CM | POA: Diagnosis not present

## 2023-11-23 DIAGNOSIS — K2289 Other specified disease of esophagus: Secondary | ICD-10-CM | POA: Diagnosis not present

## 2023-12-18 DIAGNOSIS — F332 Major depressive disorder, recurrent severe without psychotic features: Secondary | ICD-10-CM | POA: Diagnosis not present

## 2024-03-21 ENCOUNTER — Encounter: Payer: Self-pay | Admitting: Obstetrics

## 2024-03-21 ENCOUNTER — Telehealth: Payer: Self-pay | Admitting: Obstetrics

## 2024-03-21 DIAGNOSIS — Z1239 Encounter for other screening for malignant neoplasm of breast: Secondary | ICD-10-CM

## 2024-03-21 DIAGNOSIS — Z90710 Acquired absence of both cervix and uterus: Secondary | ICD-10-CM

## 2024-03-21 DIAGNOSIS — Z86718 Personal history of other venous thrombosis and embolism: Secondary | ICD-10-CM

## 2024-03-21 DIAGNOSIS — N951 Menopausal and female climacteric states: Secondary | ICD-10-CM

## 2024-05-19 ENCOUNTER — Telehealth: Payer: Self-pay | Admitting: Obstetrics
